# Patient Record
Sex: Male | Born: 1945 | Race: White | Hispanic: No | Smoking: Never smoker
Health system: Southern US, Community
[De-identification: ages and names within clinical notes are randomized; demographics above are authoritative.]

## PROBLEM LIST (undated history)

## (undated) DIAGNOSIS — D7589 Other specified diseases of blood and blood-forming organs: Secondary | ICD-10-CM

## (undated) DIAGNOSIS — K219 Gastro-esophageal reflux disease without esophagitis: Secondary | ICD-10-CM

## (undated) DIAGNOSIS — Z9109 Other allergy status, other than to drugs and biological substances: Secondary | ICD-10-CM

## (undated) DIAGNOSIS — I639 Cerebral infarction, unspecified: Secondary | ICD-10-CM

## (undated) DIAGNOSIS — C4492 Squamous cell carcinoma of skin, unspecified: Secondary | ICD-10-CM

## (undated) DIAGNOSIS — C4491 Basal cell carcinoma of skin, unspecified: Secondary | ICD-10-CM

## (undated) DIAGNOSIS — F419 Anxiety disorder, unspecified: Secondary | ICD-10-CM

## (undated) DIAGNOSIS — F32A Depression, unspecified: Secondary | ICD-10-CM

## (undated) DIAGNOSIS — G2581 Restless legs syndrome: Secondary | ICD-10-CM

## (undated) DIAGNOSIS — E785 Hyperlipidemia, unspecified: Secondary | ICD-10-CM

## (undated) DIAGNOSIS — R609 Edema, unspecified: Secondary | ICD-10-CM

## (undated) DIAGNOSIS — E039 Hypothyroidism, unspecified: Secondary | ICD-10-CM

## (undated) DIAGNOSIS — R1310 Dysphagia, unspecified: Secondary | ICD-10-CM

## (undated) DIAGNOSIS — I10 Essential (primary) hypertension: Secondary | ICD-10-CM

## (undated) DIAGNOSIS — M130 Polyarthritis, unspecified: Secondary | ICD-10-CM

## (undated) DIAGNOSIS — F329 Major depressive disorder, single episode, unspecified: Secondary | ICD-10-CM

## (undated) DIAGNOSIS — I2510 Atherosclerotic heart disease of native coronary artery without angina pectoris: Secondary | ICD-10-CM

## (undated) HISTORY — DX: Dysphagia, unspecified: R13.10

## (undated) HISTORY — DX: Hyperlipidemia, unspecified: E78.5

## (undated) HISTORY — DX: Cerebral infarction, unspecified: I63.9

## (undated) HISTORY — PX: NASAL SEPTUM SURGERY: SHX37

## (undated) HISTORY — DX: Essential (primary) hypertension: I10

## (undated) HISTORY — DX: Polyarthritis, unspecified: M13.0

## (undated) HISTORY — DX: Hypothyroidism, unspecified: E03.9

## (undated) HISTORY — DX: Restless legs syndrome: G25.81

## (undated) HISTORY — DX: Other allergy status, other than to drugs and biological substances: Z91.09

## (undated) HISTORY — DX: Edema, unspecified: R60.9

## (undated) HISTORY — DX: Other specified diseases of blood and blood-forming organs: D75.89

---
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
## 2013-03-22 DIAGNOSIS — F329 Major depressive disorder, single episode, unspecified: Secondary | ICD-10-CM | POA: Diagnosis not present

## 2013-03-22 DIAGNOSIS — Z125 Encounter for screening for malignant neoplasm of prostate: Secondary | ICD-10-CM | POA: Diagnosis not present

## 2013-03-22 DIAGNOSIS — Z0000 Encounter for general adult medical examination without abnormal findings: Secondary | ICD-10-CM | POA: Diagnosis not present

## 2013-03-22 DIAGNOSIS — Z136 Encounter for screening for cardiovascular disorders: Secondary | ICD-10-CM | POA: Diagnosis not present

## 2013-03-22 DIAGNOSIS — E039 Hypothyroidism, unspecified: Secondary | ICD-10-CM | POA: Diagnosis not present

## 2013-03-24 DIAGNOSIS — I639 Cerebral infarction, unspecified: Secondary | ICD-10-CM

## 2013-03-24 HISTORY — DX: Cerebral infarction, unspecified: I63.9

## 2013-03-30 DIAGNOSIS — R7989 Other specified abnormal findings of blood chemistry: Secondary | ICD-10-CM | POA: Diagnosis not present

## 2013-03-30 DIAGNOSIS — Z0000 Encounter for general adult medical examination without abnormal findings: Secondary | ICD-10-CM | POA: Diagnosis not present

## 2013-03-30 DIAGNOSIS — Z23 Encounter for immunization: Secondary | ICD-10-CM | POA: Diagnosis not present

## 2013-03-30 DIAGNOSIS — Z131 Encounter for screening for diabetes mellitus: Secondary | ICD-10-CM | POA: Diagnosis not present

## 2013-04-20 DIAGNOSIS — L570 Actinic keratosis: Secondary | ICD-10-CM | POA: Diagnosis not present

## 2013-04-20 DIAGNOSIS — D239 Other benign neoplasm of skin, unspecified: Secondary | ICD-10-CM | POA: Diagnosis not present

## 2013-04-20 DIAGNOSIS — L219 Seborrheic dermatitis, unspecified: Secondary | ICD-10-CM | POA: Diagnosis not present

## 2013-05-11 DIAGNOSIS — L570 Actinic keratosis: Secondary | ICD-10-CM | POA: Diagnosis not present

## 2013-05-12 DIAGNOSIS — M5030 Other cervical disc degeneration, unspecified cervical region: Secondary | ICD-10-CM | POA: Diagnosis not present

## 2013-05-12 DIAGNOSIS — M9981 Other biomechanical lesions of cervical region: Secondary | ICD-10-CM | POA: Diagnosis not present

## 2013-05-12 DIAGNOSIS — M5000 Cervical disc disorder with myelopathy, unspecified cervical region: Secondary | ICD-10-CM | POA: Diagnosis not present

## 2013-05-12 DIAGNOSIS — M542 Cervicalgia: Secondary | ICD-10-CM | POA: Diagnosis not present

## 2013-05-13 DIAGNOSIS — M5030 Other cervical disc degeneration, unspecified cervical region: Secondary | ICD-10-CM | POA: Diagnosis not present

## 2013-05-13 DIAGNOSIS — M5000 Cervical disc disorder with myelopathy, unspecified cervical region: Secondary | ICD-10-CM | POA: Diagnosis not present

## 2013-05-13 DIAGNOSIS — M542 Cervicalgia: Secondary | ICD-10-CM | POA: Diagnosis not present

## 2013-05-13 DIAGNOSIS — M9981 Other biomechanical lesions of cervical region: Secondary | ICD-10-CM | POA: Diagnosis not present

## 2013-05-14 DIAGNOSIS — M5030 Other cervical disc degeneration, unspecified cervical region: Secondary | ICD-10-CM | POA: Diagnosis not present

## 2013-05-14 DIAGNOSIS — M542 Cervicalgia: Secondary | ICD-10-CM | POA: Diagnosis not present

## 2013-05-14 DIAGNOSIS — M9981 Other biomechanical lesions of cervical region: Secondary | ICD-10-CM | POA: Diagnosis not present

## 2013-05-14 DIAGNOSIS — M5000 Cervical disc disorder with myelopathy, unspecified cervical region: Secondary | ICD-10-CM | POA: Diagnosis not present

## 2013-05-16 DIAGNOSIS — M5000 Cervical disc disorder with myelopathy, unspecified cervical region: Secondary | ICD-10-CM | POA: Diagnosis not present

## 2013-05-16 DIAGNOSIS — M9981 Other biomechanical lesions of cervical region: Secondary | ICD-10-CM | POA: Diagnosis not present

## 2013-05-16 DIAGNOSIS — M542 Cervicalgia: Secondary | ICD-10-CM | POA: Diagnosis not present

## 2013-05-16 DIAGNOSIS — M5030 Other cervical disc degeneration, unspecified cervical region: Secondary | ICD-10-CM | POA: Diagnosis not present

## 2013-05-17 DIAGNOSIS — M5030 Other cervical disc degeneration, unspecified cervical region: Secondary | ICD-10-CM | POA: Diagnosis not present

## 2013-05-17 DIAGNOSIS — M9981 Other biomechanical lesions of cervical region: Secondary | ICD-10-CM | POA: Diagnosis not present

## 2013-05-17 DIAGNOSIS — M542 Cervicalgia: Secondary | ICD-10-CM | POA: Diagnosis not present

## 2013-05-17 DIAGNOSIS — M5000 Cervical disc disorder with myelopathy, unspecified cervical region: Secondary | ICD-10-CM | POA: Diagnosis not present

## 2013-05-18 DIAGNOSIS — M9981 Other biomechanical lesions of cervical region: Secondary | ICD-10-CM | POA: Diagnosis not present

## 2013-05-18 DIAGNOSIS — M542 Cervicalgia: Secondary | ICD-10-CM | POA: Diagnosis not present

## 2013-05-18 DIAGNOSIS — M5000 Cervical disc disorder with myelopathy, unspecified cervical region: Secondary | ICD-10-CM | POA: Diagnosis not present

## 2013-05-18 DIAGNOSIS — M5030 Other cervical disc degeneration, unspecified cervical region: Secondary | ICD-10-CM | POA: Diagnosis not present

## 2013-05-23 DIAGNOSIS — M5030 Other cervical disc degeneration, unspecified cervical region: Secondary | ICD-10-CM | POA: Diagnosis not present

## 2013-05-23 DIAGNOSIS — M542 Cervicalgia: Secondary | ICD-10-CM | POA: Diagnosis not present

## 2013-05-23 DIAGNOSIS — M5000 Cervical disc disorder with myelopathy, unspecified cervical region: Secondary | ICD-10-CM | POA: Diagnosis not present

## 2013-05-23 DIAGNOSIS — M9981 Other biomechanical lesions of cervical region: Secondary | ICD-10-CM | POA: Diagnosis not present

## 2013-05-25 DIAGNOSIS — M9981 Other biomechanical lesions of cervical region: Secondary | ICD-10-CM | POA: Diagnosis not present

## 2013-05-25 DIAGNOSIS — M542 Cervicalgia: Secondary | ICD-10-CM | POA: Diagnosis not present

## 2013-05-25 DIAGNOSIS — M5000 Cervical disc disorder with myelopathy, unspecified cervical region: Secondary | ICD-10-CM | POA: Diagnosis not present

## 2013-05-25 DIAGNOSIS — M5030 Other cervical disc degeneration, unspecified cervical region: Secondary | ICD-10-CM | POA: Diagnosis not present

## 2013-05-27 DIAGNOSIS — M5030 Other cervical disc degeneration, unspecified cervical region: Secondary | ICD-10-CM | POA: Diagnosis not present

## 2013-05-27 DIAGNOSIS — M542 Cervicalgia: Secondary | ICD-10-CM | POA: Diagnosis not present

## 2013-05-27 DIAGNOSIS — M9981 Other biomechanical lesions of cervical region: Secondary | ICD-10-CM | POA: Diagnosis not present

## 2013-05-27 DIAGNOSIS — M5000 Cervical disc disorder with myelopathy, unspecified cervical region: Secondary | ICD-10-CM | POA: Diagnosis not present

## 2013-05-30 DIAGNOSIS — M542 Cervicalgia: Secondary | ICD-10-CM | POA: Diagnosis not present

## 2013-05-30 DIAGNOSIS — M5030 Other cervical disc degeneration, unspecified cervical region: Secondary | ICD-10-CM | POA: Diagnosis not present

## 2013-05-30 DIAGNOSIS — M9981 Other biomechanical lesions of cervical region: Secondary | ICD-10-CM | POA: Diagnosis not present

## 2013-05-30 DIAGNOSIS — M5000 Cervical disc disorder with myelopathy, unspecified cervical region: Secondary | ICD-10-CM | POA: Diagnosis not present

## 2013-06-01 DIAGNOSIS — M9981 Other biomechanical lesions of cervical region: Secondary | ICD-10-CM | POA: Diagnosis not present

## 2013-06-01 DIAGNOSIS — M5030 Other cervical disc degeneration, unspecified cervical region: Secondary | ICD-10-CM | POA: Diagnosis not present

## 2013-06-01 DIAGNOSIS — M5000 Cervical disc disorder with myelopathy, unspecified cervical region: Secondary | ICD-10-CM | POA: Diagnosis not present

## 2013-06-01 DIAGNOSIS — M542 Cervicalgia: Secondary | ICD-10-CM | POA: Diagnosis not present

## 2013-06-03 DIAGNOSIS — M5000 Cervical disc disorder with myelopathy, unspecified cervical region: Secondary | ICD-10-CM | POA: Diagnosis not present

## 2013-06-03 DIAGNOSIS — M5030 Other cervical disc degeneration, unspecified cervical region: Secondary | ICD-10-CM | POA: Diagnosis not present

## 2013-06-03 DIAGNOSIS — M9981 Other biomechanical lesions of cervical region: Secondary | ICD-10-CM | POA: Diagnosis not present

## 2013-06-03 DIAGNOSIS — M542 Cervicalgia: Secondary | ICD-10-CM | POA: Diagnosis not present

## 2013-06-06 DIAGNOSIS — M542 Cervicalgia: Secondary | ICD-10-CM | POA: Diagnosis not present

## 2013-06-06 DIAGNOSIS — M5000 Cervical disc disorder with myelopathy, unspecified cervical region: Secondary | ICD-10-CM | POA: Diagnosis not present

## 2013-06-06 DIAGNOSIS — M9981 Other biomechanical lesions of cervical region: Secondary | ICD-10-CM | POA: Diagnosis not present

## 2013-06-06 DIAGNOSIS — M5030 Other cervical disc degeneration, unspecified cervical region: Secondary | ICD-10-CM | POA: Diagnosis not present

## 2013-06-08 DIAGNOSIS — M9981 Other biomechanical lesions of cervical region: Secondary | ICD-10-CM | POA: Diagnosis not present

## 2013-06-08 DIAGNOSIS — M5000 Cervical disc disorder with myelopathy, unspecified cervical region: Secondary | ICD-10-CM | POA: Diagnosis not present

## 2013-06-08 DIAGNOSIS — M542 Cervicalgia: Secondary | ICD-10-CM | POA: Diagnosis not present

## 2013-06-08 DIAGNOSIS — M5030 Other cervical disc degeneration, unspecified cervical region: Secondary | ICD-10-CM | POA: Diagnosis not present

## 2013-06-13 DIAGNOSIS — M9981 Other biomechanical lesions of cervical region: Secondary | ICD-10-CM | POA: Diagnosis not present

## 2013-06-13 DIAGNOSIS — M5030 Other cervical disc degeneration, unspecified cervical region: Secondary | ICD-10-CM | POA: Diagnosis not present

## 2013-06-13 DIAGNOSIS — M542 Cervicalgia: Secondary | ICD-10-CM | POA: Diagnosis not present

## 2013-06-13 DIAGNOSIS — M5000 Cervical disc disorder with myelopathy, unspecified cervical region: Secondary | ICD-10-CM | POA: Diagnosis not present

## 2013-06-15 DIAGNOSIS — M9981 Other biomechanical lesions of cervical region: Secondary | ICD-10-CM | POA: Diagnosis not present

## 2013-06-15 DIAGNOSIS — M5030 Other cervical disc degeneration, unspecified cervical region: Secondary | ICD-10-CM | POA: Diagnosis not present

## 2013-06-15 DIAGNOSIS — M542 Cervicalgia: Secondary | ICD-10-CM | POA: Diagnosis not present

## 2013-06-15 DIAGNOSIS — M5000 Cervical disc disorder with myelopathy, unspecified cervical region: Secondary | ICD-10-CM | POA: Diagnosis not present

## 2013-06-20 DIAGNOSIS — M5000 Cervical disc disorder with myelopathy, unspecified cervical region: Secondary | ICD-10-CM | POA: Diagnosis not present

## 2013-06-20 DIAGNOSIS — M5030 Other cervical disc degeneration, unspecified cervical region: Secondary | ICD-10-CM | POA: Diagnosis not present

## 2013-06-20 DIAGNOSIS — M9981 Other biomechanical lesions of cervical region: Secondary | ICD-10-CM | POA: Diagnosis not present

## 2013-06-20 DIAGNOSIS — M542 Cervicalgia: Secondary | ICD-10-CM | POA: Diagnosis not present

## 2013-06-22 DIAGNOSIS — M9981 Other biomechanical lesions of cervical region: Secondary | ICD-10-CM | POA: Diagnosis not present

## 2013-06-22 DIAGNOSIS — M5000 Cervical disc disorder with myelopathy, unspecified cervical region: Secondary | ICD-10-CM | POA: Diagnosis not present

## 2013-06-22 DIAGNOSIS — M542 Cervicalgia: Secondary | ICD-10-CM | POA: Diagnosis not present

## 2013-06-22 DIAGNOSIS — M5030 Other cervical disc degeneration, unspecified cervical region: Secondary | ICD-10-CM | POA: Diagnosis not present

## 2013-06-28 DIAGNOSIS — M5030 Other cervical disc degeneration, unspecified cervical region: Secondary | ICD-10-CM | POA: Diagnosis not present

## 2013-06-28 DIAGNOSIS — M5000 Cervical disc disorder with myelopathy, unspecified cervical region: Secondary | ICD-10-CM | POA: Diagnosis not present

## 2013-06-28 DIAGNOSIS — M542 Cervicalgia: Secondary | ICD-10-CM | POA: Diagnosis not present

## 2013-06-28 DIAGNOSIS — M9981 Other biomechanical lesions of cervical region: Secondary | ICD-10-CM | POA: Diagnosis not present

## 2013-06-29 DIAGNOSIS — M5030 Other cervical disc degeneration, unspecified cervical region: Secondary | ICD-10-CM | POA: Diagnosis not present

## 2013-06-29 DIAGNOSIS — M5000 Cervical disc disorder with myelopathy, unspecified cervical region: Secondary | ICD-10-CM | POA: Diagnosis not present

## 2013-06-29 DIAGNOSIS — M542 Cervicalgia: Secondary | ICD-10-CM | POA: Diagnosis not present

## 2013-06-29 DIAGNOSIS — M9981 Other biomechanical lesions of cervical region: Secondary | ICD-10-CM | POA: Diagnosis not present

## 2013-07-06 DIAGNOSIS — M9981 Other biomechanical lesions of cervical region: Secondary | ICD-10-CM | POA: Diagnosis not present

## 2013-07-06 DIAGNOSIS — M542 Cervicalgia: Secondary | ICD-10-CM | POA: Diagnosis not present

## 2013-07-06 DIAGNOSIS — M5000 Cervical disc disorder with myelopathy, unspecified cervical region: Secondary | ICD-10-CM | POA: Diagnosis not present

## 2013-07-06 DIAGNOSIS — M5030 Other cervical disc degeneration, unspecified cervical region: Secondary | ICD-10-CM | POA: Diagnosis not present

## 2013-07-12 DIAGNOSIS — H1045 Other chronic allergic conjunctivitis: Secondary | ICD-10-CM | POA: Diagnosis not present

## 2013-07-12 DIAGNOSIS — J019 Acute sinusitis, unspecified: Secondary | ICD-10-CM | POA: Diagnosis not present

## 2013-07-12 DIAGNOSIS — J309 Allergic rhinitis, unspecified: Secondary | ICD-10-CM | POA: Diagnosis not present

## 2013-07-13 DIAGNOSIS — M9981 Other biomechanical lesions of cervical region: Secondary | ICD-10-CM | POA: Diagnosis not present

## 2013-07-13 DIAGNOSIS — M5030 Other cervical disc degeneration, unspecified cervical region: Secondary | ICD-10-CM | POA: Diagnosis not present

## 2013-07-13 DIAGNOSIS — M5000 Cervical disc disorder with myelopathy, unspecified cervical region: Secondary | ICD-10-CM | POA: Diagnosis not present

## 2013-07-13 DIAGNOSIS — M542 Cervicalgia: Secondary | ICD-10-CM | POA: Diagnosis not present

## 2013-07-15 DIAGNOSIS — J309 Allergic rhinitis, unspecified: Secondary | ICD-10-CM | POA: Diagnosis not present

## 2013-07-20 DIAGNOSIS — M542 Cervicalgia: Secondary | ICD-10-CM | POA: Diagnosis not present

## 2013-07-20 DIAGNOSIS — M5000 Cervical disc disorder with myelopathy, unspecified cervical region: Secondary | ICD-10-CM | POA: Diagnosis not present

## 2013-07-20 DIAGNOSIS — M5030 Other cervical disc degeneration, unspecified cervical region: Secondary | ICD-10-CM | POA: Diagnosis not present

## 2013-07-20 DIAGNOSIS — M9981 Other biomechanical lesions of cervical region: Secondary | ICD-10-CM | POA: Diagnosis not present

## 2013-07-26 DIAGNOSIS — J309 Allergic rhinitis, unspecified: Secondary | ICD-10-CM | POA: Diagnosis not present

## 2013-07-27 DIAGNOSIS — M542 Cervicalgia: Secondary | ICD-10-CM | POA: Diagnosis not present

## 2013-07-27 DIAGNOSIS — M9981 Other biomechanical lesions of cervical region: Secondary | ICD-10-CM | POA: Diagnosis not present

## 2013-07-27 DIAGNOSIS — M5030 Other cervical disc degeneration, unspecified cervical region: Secondary | ICD-10-CM | POA: Diagnosis not present

## 2013-07-27 DIAGNOSIS — M5000 Cervical disc disorder with myelopathy, unspecified cervical region: Secondary | ICD-10-CM | POA: Diagnosis not present

## 2013-07-28 DIAGNOSIS — J309 Allergic rhinitis, unspecified: Secondary | ICD-10-CM | POA: Diagnosis not present

## 2013-08-01 DIAGNOSIS — J309 Allergic rhinitis, unspecified: Secondary | ICD-10-CM | POA: Diagnosis not present

## 2013-08-02 DIAGNOSIS — M542 Cervicalgia: Secondary | ICD-10-CM | POA: Diagnosis not present

## 2013-08-02 DIAGNOSIS — M5030 Other cervical disc degeneration, unspecified cervical region: Secondary | ICD-10-CM | POA: Diagnosis not present

## 2013-08-02 DIAGNOSIS — M5000 Cervical disc disorder with myelopathy, unspecified cervical region: Secondary | ICD-10-CM | POA: Diagnosis not present

## 2013-08-02 DIAGNOSIS — M9981 Other biomechanical lesions of cervical region: Secondary | ICD-10-CM | POA: Diagnosis not present

## 2013-08-03 DIAGNOSIS — R7989 Other specified abnormal findings of blood chemistry: Secondary | ICD-10-CM | POA: Diagnosis not present

## 2013-08-03 DIAGNOSIS — J309 Allergic rhinitis, unspecified: Secondary | ICD-10-CM | POA: Diagnosis not present

## 2013-08-05 DIAGNOSIS — J309 Allergic rhinitis, unspecified: Secondary | ICD-10-CM | POA: Diagnosis not present

## 2013-08-08 DIAGNOSIS — J309 Allergic rhinitis, unspecified: Secondary | ICD-10-CM | POA: Diagnosis not present

## 2013-08-10 DIAGNOSIS — J309 Allergic rhinitis, unspecified: Secondary | ICD-10-CM | POA: Diagnosis not present

## 2013-08-12 DIAGNOSIS — J309 Allergic rhinitis, unspecified: Secondary | ICD-10-CM | POA: Diagnosis not present

## 2013-08-22 DIAGNOSIS — J309 Allergic rhinitis, unspecified: Secondary | ICD-10-CM | POA: Diagnosis not present

## 2013-08-24 DIAGNOSIS — J309 Allergic rhinitis, unspecified: Secondary | ICD-10-CM | POA: Diagnosis not present

## 2013-08-26 DIAGNOSIS — J309 Allergic rhinitis, unspecified: Secondary | ICD-10-CM | POA: Diagnosis not present

## 2013-08-30 DIAGNOSIS — M5000 Cervical disc disorder with myelopathy, unspecified cervical region: Secondary | ICD-10-CM | POA: Diagnosis not present

## 2013-08-30 DIAGNOSIS — J309 Allergic rhinitis, unspecified: Secondary | ICD-10-CM | POA: Diagnosis not present

## 2013-08-30 DIAGNOSIS — M5030 Other cervical disc degeneration, unspecified cervical region: Secondary | ICD-10-CM | POA: Diagnosis not present

## 2013-08-30 DIAGNOSIS — M9981 Other biomechanical lesions of cervical region: Secondary | ICD-10-CM | POA: Diagnosis not present

## 2013-08-30 DIAGNOSIS — M542 Cervicalgia: Secondary | ICD-10-CM | POA: Diagnosis not present

## 2013-09-02 DIAGNOSIS — J309 Allergic rhinitis, unspecified: Secondary | ICD-10-CM | POA: Diagnosis not present

## 2013-09-05 DIAGNOSIS — J309 Allergic rhinitis, unspecified: Secondary | ICD-10-CM | POA: Diagnosis not present

## 2013-09-08 DIAGNOSIS — J309 Allergic rhinitis, unspecified: Secondary | ICD-10-CM | POA: Diagnosis not present

## 2013-09-12 DIAGNOSIS — J309 Allergic rhinitis, unspecified: Secondary | ICD-10-CM | POA: Diagnosis not present

## 2013-09-15 DIAGNOSIS — J309 Allergic rhinitis, unspecified: Secondary | ICD-10-CM | POA: Diagnosis not present

## 2013-09-19 DIAGNOSIS — J309 Allergic rhinitis, unspecified: Secondary | ICD-10-CM | POA: Diagnosis not present

## 2013-09-21 DIAGNOSIS — J309 Allergic rhinitis, unspecified: Secondary | ICD-10-CM | POA: Diagnosis not present

## 2013-09-28 DIAGNOSIS — J309 Allergic rhinitis, unspecified: Secondary | ICD-10-CM | POA: Diagnosis not present

## 2013-09-30 DIAGNOSIS — J309 Allergic rhinitis, unspecified: Secondary | ICD-10-CM | POA: Diagnosis not present

## 2013-10-03 DIAGNOSIS — J309 Allergic rhinitis, unspecified: Secondary | ICD-10-CM | POA: Diagnosis not present

## 2013-10-06 DIAGNOSIS — J309 Allergic rhinitis, unspecified: Secondary | ICD-10-CM | POA: Diagnosis not present

## 2013-10-07 DIAGNOSIS — H1045 Other chronic allergic conjunctivitis: Secondary | ICD-10-CM | POA: Diagnosis not present

## 2013-10-07 DIAGNOSIS — J3089 Other allergic rhinitis: Secondary | ICD-10-CM | POA: Diagnosis not present

## 2013-10-07 DIAGNOSIS — J301 Allergic rhinitis due to pollen: Secondary | ICD-10-CM | POA: Diagnosis not present

## 2013-10-07 DIAGNOSIS — J019 Acute sinusitis, unspecified: Secondary | ICD-10-CM | POA: Diagnosis not present

## 2013-10-10 DIAGNOSIS — J309 Allergic rhinitis, unspecified: Secondary | ICD-10-CM | POA: Diagnosis not present

## 2013-10-14 DIAGNOSIS — J309 Allergic rhinitis, unspecified: Secondary | ICD-10-CM | POA: Diagnosis not present

## 2013-10-18 DIAGNOSIS — J309 Allergic rhinitis, unspecified: Secondary | ICD-10-CM | POA: Diagnosis not present

## 2013-10-21 DIAGNOSIS — J309 Allergic rhinitis, unspecified: Secondary | ICD-10-CM | POA: Diagnosis not present

## 2013-10-24 DIAGNOSIS — J309 Allergic rhinitis, unspecified: Secondary | ICD-10-CM | POA: Diagnosis not present

## 2013-11-01 DIAGNOSIS — J309 Allergic rhinitis, unspecified: Secondary | ICD-10-CM | POA: Diagnosis not present

## 2013-11-14 DIAGNOSIS — J309 Allergic rhinitis, unspecified: Secondary | ICD-10-CM | POA: Diagnosis not present

## 2013-11-21 DIAGNOSIS — J309 Allergic rhinitis, unspecified: Secondary | ICD-10-CM | POA: Diagnosis not present

## 2013-11-29 DIAGNOSIS — J309 Allergic rhinitis, unspecified: Secondary | ICD-10-CM | POA: Diagnosis not present

## 2013-12-02 DIAGNOSIS — J309 Allergic rhinitis, unspecified: Secondary | ICD-10-CM | POA: Diagnosis not present

## 2013-12-05 DIAGNOSIS — J309 Allergic rhinitis, unspecified: Secondary | ICD-10-CM | POA: Diagnosis not present

## 2013-12-08 DIAGNOSIS — J309 Allergic rhinitis, unspecified: Secondary | ICD-10-CM | POA: Diagnosis not present

## 2013-12-09 DIAGNOSIS — Z23 Encounter for immunization: Secondary | ICD-10-CM | POA: Diagnosis not present

## 2013-12-12 DIAGNOSIS — J3089 Other allergic rhinitis: Secondary | ICD-10-CM | POA: Diagnosis not present

## 2013-12-12 DIAGNOSIS — J309 Allergic rhinitis, unspecified: Secondary | ICD-10-CM | POA: Diagnosis not present

## 2013-12-12 DIAGNOSIS — J301 Allergic rhinitis due to pollen: Secondary | ICD-10-CM | POA: Diagnosis not present

## 2013-12-12 DIAGNOSIS — H1045 Other chronic allergic conjunctivitis: Secondary | ICD-10-CM | POA: Diagnosis not present

## 2013-12-15 DIAGNOSIS — L570 Actinic keratosis: Secondary | ICD-10-CM | POA: Diagnosis not present

## 2013-12-15 DIAGNOSIS — D485 Neoplasm of uncertain behavior of skin: Secondary | ICD-10-CM | POA: Diagnosis not present

## 2013-12-19 DIAGNOSIS — J309 Allergic rhinitis, unspecified: Secondary | ICD-10-CM | POA: Diagnosis not present

## 2013-12-23 DIAGNOSIS — L570 Actinic keratosis: Secondary | ICD-10-CM | POA: Diagnosis not present

## 2013-12-26 DIAGNOSIS — J301 Allergic rhinitis due to pollen: Secondary | ICD-10-CM | POA: Diagnosis not present

## 2013-12-26 DIAGNOSIS — J3081 Allergic rhinitis due to animal (cat) (dog) hair and dander: Secondary | ICD-10-CM | POA: Diagnosis not present

## 2013-12-26 DIAGNOSIS — J3089 Other allergic rhinitis: Secondary | ICD-10-CM | POA: Diagnosis not present

## 2013-12-27 DIAGNOSIS — H2513 Age-related nuclear cataract, bilateral: Secondary | ICD-10-CM | POA: Diagnosis not present

## 2013-12-27 DIAGNOSIS — H5703 Miosis: Secondary | ICD-10-CM | POA: Diagnosis not present

## 2014-01-02 DIAGNOSIS — J3081 Allergic rhinitis due to animal (cat) (dog) hair and dander: Secondary | ICD-10-CM | POA: Diagnosis not present

## 2014-01-02 DIAGNOSIS — J301 Allergic rhinitis due to pollen: Secondary | ICD-10-CM | POA: Diagnosis not present

## 2014-01-02 DIAGNOSIS — J3089 Other allergic rhinitis: Secondary | ICD-10-CM | POA: Diagnosis not present

## 2014-01-09 DIAGNOSIS — J3081 Allergic rhinitis due to animal (cat) (dog) hair and dander: Secondary | ICD-10-CM | POA: Diagnosis not present

## 2014-01-09 DIAGNOSIS — J301 Allergic rhinitis due to pollen: Secondary | ICD-10-CM | POA: Diagnosis not present

## 2014-01-09 DIAGNOSIS — J3089 Other allergic rhinitis: Secondary | ICD-10-CM | POA: Diagnosis not present

## 2014-01-16 DIAGNOSIS — J3081 Allergic rhinitis due to animal (cat) (dog) hair and dander: Secondary | ICD-10-CM | POA: Diagnosis not present

## 2014-01-16 DIAGNOSIS — J301 Allergic rhinitis due to pollen: Secondary | ICD-10-CM | POA: Diagnosis not present

## 2014-01-16 DIAGNOSIS — J3089 Other allergic rhinitis: Secondary | ICD-10-CM | POA: Diagnosis not present

## 2014-01-20 DIAGNOSIS — J019 Acute sinusitis, unspecified: Secondary | ICD-10-CM | POA: Diagnosis not present

## 2014-01-23 DIAGNOSIS — L570 Actinic keratosis: Secondary | ICD-10-CM | POA: Diagnosis not present

## 2014-01-23 DIAGNOSIS — J3081 Allergic rhinitis due to animal (cat) (dog) hair and dander: Secondary | ICD-10-CM | POA: Diagnosis not present

## 2014-01-23 DIAGNOSIS — D485 Neoplasm of uncertain behavior of skin: Secondary | ICD-10-CM | POA: Diagnosis not present

## 2014-01-23 DIAGNOSIS — J301 Allergic rhinitis due to pollen: Secondary | ICD-10-CM | POA: Diagnosis not present

## 2014-01-23 DIAGNOSIS — J3089 Other allergic rhinitis: Secondary | ICD-10-CM | POA: Diagnosis not present

## 2014-01-31 DIAGNOSIS — J3089 Other allergic rhinitis: Secondary | ICD-10-CM | POA: Diagnosis not present

## 2014-01-31 DIAGNOSIS — J301 Allergic rhinitis due to pollen: Secondary | ICD-10-CM | POA: Diagnosis not present

## 2014-02-06 DIAGNOSIS — J3081 Allergic rhinitis due to animal (cat) (dog) hair and dander: Secondary | ICD-10-CM | POA: Diagnosis not present

## 2014-02-06 DIAGNOSIS — J3089 Other allergic rhinitis: Secondary | ICD-10-CM | POA: Diagnosis not present

## 2014-02-06 DIAGNOSIS — J301 Allergic rhinitis due to pollen: Secondary | ICD-10-CM | POA: Diagnosis not present

## 2014-02-13 DIAGNOSIS — J301 Allergic rhinitis due to pollen: Secondary | ICD-10-CM | POA: Diagnosis not present

## 2014-02-13 DIAGNOSIS — J3089 Other allergic rhinitis: Secondary | ICD-10-CM | POA: Diagnosis not present

## 2014-02-13 DIAGNOSIS — J3081 Allergic rhinitis due to animal (cat) (dog) hair and dander: Secondary | ICD-10-CM | POA: Diagnosis not present

## 2014-02-14 DIAGNOSIS — J329 Chronic sinusitis, unspecified: Secondary | ICD-10-CM | POA: Diagnosis not present

## 2014-02-14 DIAGNOSIS — E780 Pure hypercholesterolemia: Secondary | ICD-10-CM | POA: Diagnosis not present

## 2014-02-14 DIAGNOSIS — F4321 Adjustment disorder with depressed mood: Secondary | ICD-10-CM | POA: Diagnosis not present

## 2014-02-14 DIAGNOSIS — Z23 Encounter for immunization: Secondary | ICD-10-CM | POA: Diagnosis not present

## 2014-02-14 DIAGNOSIS — E039 Hypothyroidism, unspecified: Secondary | ICD-10-CM | POA: Diagnosis not present

## 2014-02-20 DIAGNOSIS — J3089 Other allergic rhinitis: Secondary | ICD-10-CM | POA: Diagnosis not present

## 2014-02-20 DIAGNOSIS — J301 Allergic rhinitis due to pollen: Secondary | ICD-10-CM | POA: Diagnosis not present

## 2014-02-20 DIAGNOSIS — J3081 Allergic rhinitis due to animal (cat) (dog) hair and dander: Secondary | ICD-10-CM | POA: Diagnosis not present

## 2014-02-27 DIAGNOSIS — J301 Allergic rhinitis due to pollen: Secondary | ICD-10-CM | POA: Diagnosis not present

## 2014-02-27 DIAGNOSIS — J3089 Other allergic rhinitis: Secondary | ICD-10-CM | POA: Diagnosis not present

## 2014-02-27 DIAGNOSIS — J3081 Allergic rhinitis due to animal (cat) (dog) hair and dander: Secondary | ICD-10-CM | POA: Diagnosis not present

## 2014-03-07 DIAGNOSIS — J3089 Other allergic rhinitis: Secondary | ICD-10-CM | POA: Diagnosis not present

## 2014-03-07 DIAGNOSIS — J3081 Allergic rhinitis due to animal (cat) (dog) hair and dander: Secondary | ICD-10-CM | POA: Diagnosis not present

## 2014-03-07 DIAGNOSIS — J301 Allergic rhinitis due to pollen: Secondary | ICD-10-CM | POA: Diagnosis not present

## 2014-03-14 DIAGNOSIS — J301 Allergic rhinitis due to pollen: Secondary | ICD-10-CM | POA: Diagnosis not present

## 2014-03-14 DIAGNOSIS — J3081 Allergic rhinitis due to animal (cat) (dog) hair and dander: Secondary | ICD-10-CM | POA: Diagnosis not present

## 2014-03-14 DIAGNOSIS — J3089 Other allergic rhinitis: Secondary | ICD-10-CM | POA: Diagnosis not present

## 2014-03-22 DIAGNOSIS — J3081 Allergic rhinitis due to animal (cat) (dog) hair and dander: Secondary | ICD-10-CM | POA: Diagnosis not present

## 2014-03-22 DIAGNOSIS — J301 Allergic rhinitis due to pollen: Secondary | ICD-10-CM | POA: Diagnosis not present

## 2014-03-22 DIAGNOSIS — J3089 Other allergic rhinitis: Secondary | ICD-10-CM | POA: Diagnosis not present

## 2014-03-27 DIAGNOSIS — M542 Cervicalgia: Secondary | ICD-10-CM | POA: Diagnosis not present

## 2014-03-27 DIAGNOSIS — M9901 Segmental and somatic dysfunction of cervical region: Secondary | ICD-10-CM | POA: Diagnosis not present

## 2014-03-27 DIAGNOSIS — M9902 Segmental and somatic dysfunction of thoracic region: Secondary | ICD-10-CM | POA: Diagnosis not present

## 2014-03-27 DIAGNOSIS — M9903 Segmental and somatic dysfunction of lumbar region: Secondary | ICD-10-CM | POA: Diagnosis not present

## 2014-03-27 DIAGNOSIS — J3089 Other allergic rhinitis: Secondary | ICD-10-CM | POA: Diagnosis not present

## 2014-03-27 DIAGNOSIS — J301 Allergic rhinitis due to pollen: Secondary | ICD-10-CM | POA: Diagnosis not present

## 2014-03-27 DIAGNOSIS — J3081 Allergic rhinitis due to animal (cat) (dog) hair and dander: Secondary | ICD-10-CM | POA: Diagnosis not present

## 2014-04-01 DIAGNOSIS — C4491 Basal cell carcinoma of skin, unspecified: Secondary | ICD-10-CM

## 2014-04-01 HISTORY — DX: Basal cell carcinoma of skin, unspecified: C44.91

## 2014-04-03 DIAGNOSIS — J301 Allergic rhinitis due to pollen: Secondary | ICD-10-CM | POA: Diagnosis not present

## 2014-04-03 DIAGNOSIS — J3081 Allergic rhinitis due to animal (cat) (dog) hair and dander: Secondary | ICD-10-CM | POA: Diagnosis not present

## 2014-04-03 DIAGNOSIS — J3089 Other allergic rhinitis: Secondary | ICD-10-CM | POA: Diagnosis not present

## 2014-04-11 DIAGNOSIS — J3081 Allergic rhinitis due to animal (cat) (dog) hair and dander: Secondary | ICD-10-CM | POA: Diagnosis not present

## 2014-04-11 DIAGNOSIS — J301 Allergic rhinitis due to pollen: Secondary | ICD-10-CM | POA: Diagnosis not present

## 2014-04-11 DIAGNOSIS — J3089 Other allergic rhinitis: Secondary | ICD-10-CM | POA: Diagnosis not present

## 2014-04-13 DIAGNOSIS — L570 Actinic keratosis: Secondary | ICD-10-CM | POA: Diagnosis not present

## 2014-04-13 DIAGNOSIS — L723 Sebaceous cyst: Secondary | ICD-10-CM | POA: Diagnosis not present

## 2014-04-17 DIAGNOSIS — J3089 Other allergic rhinitis: Secondary | ICD-10-CM | POA: Diagnosis not present

## 2014-04-17 DIAGNOSIS — J3081 Allergic rhinitis due to animal (cat) (dog) hair and dander: Secondary | ICD-10-CM | POA: Diagnosis not present

## 2014-04-17 DIAGNOSIS — J301 Allergic rhinitis due to pollen: Secondary | ICD-10-CM | POA: Diagnosis not present

## 2014-04-19 DIAGNOSIS — J301 Allergic rhinitis due to pollen: Secondary | ICD-10-CM | POA: Diagnosis not present

## 2014-04-19 DIAGNOSIS — J3081 Allergic rhinitis due to animal (cat) (dog) hair and dander: Secondary | ICD-10-CM | POA: Diagnosis not present

## 2014-04-19 DIAGNOSIS — J3089 Other allergic rhinitis: Secondary | ICD-10-CM | POA: Diagnosis not present

## 2014-04-25 DIAGNOSIS — J3089 Other allergic rhinitis: Secondary | ICD-10-CM | POA: Diagnosis not present

## 2014-04-25 DIAGNOSIS — J301 Allergic rhinitis due to pollen: Secondary | ICD-10-CM | POA: Diagnosis not present

## 2014-04-25 DIAGNOSIS — J3081 Allergic rhinitis due to animal (cat) (dog) hair and dander: Secondary | ICD-10-CM | POA: Diagnosis not present

## 2014-05-01 DIAGNOSIS — J301 Allergic rhinitis due to pollen: Secondary | ICD-10-CM | POA: Diagnosis not present

## 2014-05-01 DIAGNOSIS — M9901 Segmental and somatic dysfunction of cervical region: Secondary | ICD-10-CM | POA: Diagnosis not present

## 2014-05-01 DIAGNOSIS — M9903 Segmental and somatic dysfunction of lumbar region: Secondary | ICD-10-CM | POA: Diagnosis not present

## 2014-05-01 DIAGNOSIS — J3081 Allergic rhinitis due to animal (cat) (dog) hair and dander: Secondary | ICD-10-CM | POA: Diagnosis not present

## 2014-05-01 DIAGNOSIS — J3089 Other allergic rhinitis: Secondary | ICD-10-CM | POA: Diagnosis not present

## 2014-05-01 DIAGNOSIS — M9902 Segmental and somatic dysfunction of thoracic region: Secondary | ICD-10-CM | POA: Diagnosis not present

## 2014-05-01 DIAGNOSIS — M542 Cervicalgia: Secondary | ICD-10-CM | POA: Diagnosis not present

## 2014-05-05 DIAGNOSIS — J3081 Allergic rhinitis due to animal (cat) (dog) hair and dander: Secondary | ICD-10-CM | POA: Diagnosis not present

## 2014-05-05 DIAGNOSIS — J301 Allergic rhinitis due to pollen: Secondary | ICD-10-CM | POA: Diagnosis not present

## 2014-05-05 DIAGNOSIS — J3089 Other allergic rhinitis: Secondary | ICD-10-CM | POA: Diagnosis not present

## 2014-05-09 DIAGNOSIS — J301 Allergic rhinitis due to pollen: Secondary | ICD-10-CM | POA: Diagnosis not present

## 2014-05-09 DIAGNOSIS — J3089 Other allergic rhinitis: Secondary | ICD-10-CM | POA: Diagnosis not present

## 2014-05-09 DIAGNOSIS — J3081 Allergic rhinitis due to animal (cat) (dog) hair and dander: Secondary | ICD-10-CM | POA: Diagnosis not present

## 2014-05-12 DIAGNOSIS — J3089 Other allergic rhinitis: Secondary | ICD-10-CM | POA: Diagnosis not present

## 2014-05-12 DIAGNOSIS — J3081 Allergic rhinitis due to animal (cat) (dog) hair and dander: Secondary | ICD-10-CM | POA: Diagnosis not present

## 2014-05-12 DIAGNOSIS — J301 Allergic rhinitis due to pollen: Secondary | ICD-10-CM | POA: Diagnosis not present

## 2014-05-16 DIAGNOSIS — J301 Allergic rhinitis due to pollen: Secondary | ICD-10-CM | POA: Diagnosis not present

## 2014-05-16 DIAGNOSIS — J3089 Other allergic rhinitis: Secondary | ICD-10-CM | POA: Diagnosis not present

## 2014-05-16 DIAGNOSIS — J3081 Allergic rhinitis due to animal (cat) (dog) hair and dander: Secondary | ICD-10-CM | POA: Diagnosis not present

## 2014-05-22 DIAGNOSIS — J301 Allergic rhinitis due to pollen: Secondary | ICD-10-CM | POA: Diagnosis not present

## 2014-05-22 DIAGNOSIS — J3081 Allergic rhinitis due to animal (cat) (dog) hair and dander: Secondary | ICD-10-CM | POA: Diagnosis not present

## 2014-05-22 DIAGNOSIS — J3089 Other allergic rhinitis: Secondary | ICD-10-CM | POA: Diagnosis not present

## 2014-05-29 DIAGNOSIS — J3089 Other allergic rhinitis: Secondary | ICD-10-CM | POA: Diagnosis not present

## 2014-05-29 DIAGNOSIS — M9903 Segmental and somatic dysfunction of lumbar region: Secondary | ICD-10-CM | POA: Diagnosis not present

## 2014-05-29 DIAGNOSIS — M9902 Segmental and somatic dysfunction of thoracic region: Secondary | ICD-10-CM | POA: Diagnosis not present

## 2014-05-29 DIAGNOSIS — J3081 Allergic rhinitis due to animal (cat) (dog) hair and dander: Secondary | ICD-10-CM | POA: Diagnosis not present

## 2014-05-29 DIAGNOSIS — M542 Cervicalgia: Secondary | ICD-10-CM | POA: Diagnosis not present

## 2014-05-29 DIAGNOSIS — J301 Allergic rhinitis due to pollen: Secondary | ICD-10-CM | POA: Diagnosis not present

## 2014-05-29 DIAGNOSIS — M9901 Segmental and somatic dysfunction of cervical region: Secondary | ICD-10-CM | POA: Diagnosis not present

## 2014-06-05 DIAGNOSIS — J3089 Other allergic rhinitis: Secondary | ICD-10-CM | POA: Diagnosis not present

## 2014-06-05 DIAGNOSIS — J301 Allergic rhinitis due to pollen: Secondary | ICD-10-CM | POA: Diagnosis not present

## 2014-06-05 DIAGNOSIS — J3081 Allergic rhinitis due to animal (cat) (dog) hair and dander: Secondary | ICD-10-CM | POA: Diagnosis not present

## 2014-06-12 DIAGNOSIS — J301 Allergic rhinitis due to pollen: Secondary | ICD-10-CM | POA: Diagnosis not present

## 2014-06-12 DIAGNOSIS — J3089 Other allergic rhinitis: Secondary | ICD-10-CM | POA: Diagnosis not present

## 2014-06-12 DIAGNOSIS — J3081 Allergic rhinitis due to animal (cat) (dog) hair and dander: Secondary | ICD-10-CM | POA: Diagnosis not present

## 2014-06-19 DIAGNOSIS — J301 Allergic rhinitis due to pollen: Secondary | ICD-10-CM | POA: Diagnosis not present

## 2014-06-19 DIAGNOSIS — J3089 Other allergic rhinitis: Secondary | ICD-10-CM | POA: Diagnosis not present

## 2014-06-26 DIAGNOSIS — J3081 Allergic rhinitis due to animal (cat) (dog) hair and dander: Secondary | ICD-10-CM | POA: Diagnosis not present

## 2014-06-26 DIAGNOSIS — J301 Allergic rhinitis due to pollen: Secondary | ICD-10-CM | POA: Diagnosis not present

## 2014-06-26 DIAGNOSIS — M9902 Segmental and somatic dysfunction of thoracic region: Secondary | ICD-10-CM | POA: Diagnosis not present

## 2014-06-26 DIAGNOSIS — J3089 Other allergic rhinitis: Secondary | ICD-10-CM | POA: Diagnosis not present

## 2014-06-26 DIAGNOSIS — M9901 Segmental and somatic dysfunction of cervical region: Secondary | ICD-10-CM | POA: Diagnosis not present

## 2014-06-26 DIAGNOSIS — M9903 Segmental and somatic dysfunction of lumbar region: Secondary | ICD-10-CM | POA: Diagnosis not present

## 2014-06-26 DIAGNOSIS — M542 Cervicalgia: Secondary | ICD-10-CM | POA: Diagnosis not present

## 2014-06-27 DIAGNOSIS — H1045 Other chronic allergic conjunctivitis: Secondary | ICD-10-CM | POA: Diagnosis not present

## 2014-06-27 DIAGNOSIS — J3089 Other allergic rhinitis: Secondary | ICD-10-CM | POA: Diagnosis not present

## 2014-06-27 DIAGNOSIS — J019 Acute sinusitis, unspecified: Secondary | ICD-10-CM | POA: Diagnosis not present

## 2014-06-27 DIAGNOSIS — J209 Acute bronchitis, unspecified: Secondary | ICD-10-CM | POA: Diagnosis not present

## 2014-06-27 DIAGNOSIS — R05 Cough: Secondary | ICD-10-CM | POA: Diagnosis not present

## 2014-06-27 DIAGNOSIS — J3081 Allergic rhinitis due to animal (cat) (dog) hair and dander: Secondary | ICD-10-CM | POA: Diagnosis not present

## 2014-06-27 DIAGNOSIS — J301 Allergic rhinitis due to pollen: Secondary | ICD-10-CM | POA: Diagnosis not present

## 2014-07-01 ENCOUNTER — Encounter (HOSPITAL_COMMUNITY): Payer: Self-pay | Admitting: Emergency Medicine

## 2014-07-01 ENCOUNTER — Inpatient Hospital Stay (HOSPITAL_COMMUNITY)
Admission: EM | Admit: 2014-07-01 | Discharge: 2014-07-04 | DRG: 065 | Disposition: A | Discharge status: Home Health | Payer: Medicare Other | Attending: Internal Medicine | Admitting: Internal Medicine

## 2014-07-01 ENCOUNTER — Emergency Department (HOSPITAL_COMMUNITY): Payer: Medicare Other

## 2014-07-01 DIAGNOSIS — R1310 Dysphagia, unspecified: Secondary | ICD-10-CM

## 2014-07-01 DIAGNOSIS — R471 Dysarthria and anarthria: Secondary | ICD-10-CM | POA: Diagnosis present

## 2014-07-01 DIAGNOSIS — Z66 Do not resuscitate: Secondary | ICD-10-CM | POA: Diagnosis not present

## 2014-07-01 DIAGNOSIS — I6502 Occlusion and stenosis of left vertebral artery: Secondary | ICD-10-CM

## 2014-07-01 DIAGNOSIS — I63212 Cerebral infarction due to unspecified occlusion or stenosis of left vertebral arteries: Principal | ICD-10-CM | POA: Diagnosis present

## 2014-07-01 DIAGNOSIS — I639 Cerebral infarction, unspecified: Secondary | ICD-10-CM | POA: Diagnosis present

## 2014-07-01 DIAGNOSIS — R4702 Dysphasia: Secondary | ICD-10-CM | POA: Diagnosis not present

## 2014-07-01 DIAGNOSIS — R200 Anesthesia of skin: Secondary | ICD-10-CM

## 2014-07-01 DIAGNOSIS — F329 Major depressive disorder, single episode, unspecified: Secondary | ICD-10-CM | POA: Diagnosis not present

## 2014-07-01 DIAGNOSIS — E039 Hypothyroidism, unspecified: Secondary | ICD-10-CM | POA: Diagnosis not present

## 2014-07-01 DIAGNOSIS — K219 Gastro-esophageal reflux disease without esophagitis: Secondary | ICD-10-CM | POA: Diagnosis present

## 2014-07-01 DIAGNOSIS — G8191 Hemiplegia, unspecified affecting right dominant side: Secondary | ICD-10-CM

## 2014-07-01 DIAGNOSIS — E785 Hyperlipidemia, unspecified: Secondary | ICD-10-CM | POA: Diagnosis present

## 2014-07-01 DIAGNOSIS — I6509 Occlusion and stenosis of unspecified vertebral artery: Secondary | ICD-10-CM | POA: Diagnosis present

## 2014-07-01 HISTORY — DX: Cerebral infarction, unspecified: I63.9

## 2014-07-01 HISTORY — DX: Depression, unspecified: F32.A

## 2014-07-01 HISTORY — DX: Major depressive disorder, single episode, unspecified: F32.9

## 2014-07-01 LAB — COMPREHENSIVE METABOLIC PANEL
ALBUMIN: 3.8 g/dL (ref 3.5–5.2)
ALT: 31 U/L (ref 0–53)
ANION GAP: 10 (ref 5–15)
AST: 29 U/L (ref 0–37)
Alkaline Phosphatase: 66 U/L (ref 39–117)
BUN: 23 mg/dL (ref 6–23)
CO2: 27 mmol/L (ref 19–32)
Calcium: 8.8 mg/dL (ref 8.4–10.5)
Chloride: 102 mmol/L (ref 96–112)
Creatinine, Ser: 0.89 mg/dL (ref 0.50–1.35)
GFR calc non Af Amer: 87 mL/min — ABNORMAL LOW (ref >90)
Glucose, Bld: 141 mg/dL — ABNORMAL HIGH (ref 70–99)
Potassium: 4.1 mmol/L (ref 3.5–5.1)
SODIUM: 139 mmol/L (ref 135–145)
Total Bilirubin: 0.9 mg/dL (ref 0.3–1.2)
Total Protein: 7 g/dL (ref 6.0–8.3)

## 2014-07-01 LAB — I-STAT CHEM 8, ED
BUN: 31 mg/dL — AB (ref 6–23)
CALCIUM ION: 1.11 mmol/L — AB (ref 1.13–1.30)
Chloride: 100 mmol/L (ref 96–112)
Creatinine, Ser: 0.8 mg/dL (ref 0.50–1.35)
GLUCOSE: 146 mg/dL — AB (ref 70–99)
HCT: 47 % (ref 39.0–52.0)
Hemoglobin: 16 g/dL (ref 13.0–17.0)
Potassium: 4 mmol/L (ref 3.5–5.1)
SODIUM: 140 mmol/L (ref 135–145)
TCO2: 28 mmol/L (ref 0–100)

## 2014-07-01 LAB — CBC
HCT: 44.2 % (ref 39.0–52.0)
HEMOGLOBIN: 15.1 g/dL (ref 13.0–17.0)
MCH: 32.3 pg (ref 26.0–34.0)
MCHC: 34.2 g/dL (ref 30.0–36.0)
MCV: 94.6 fL (ref 78.0–100.0)
Platelets: 273 10*3/uL (ref 150–400)
RBC: 4.67 MIL/uL (ref 4.22–5.81)
RDW: 12.7 % (ref 11.5–15.5)
WBC: 11.5 10*3/uL — ABNORMAL HIGH (ref 4.0–10.5)

## 2014-07-01 LAB — PROTIME-INR
INR: 0.97 (ref 0.00–1.49)
Prothrombin Time: 13 seconds (ref 11.6–15.2)

## 2014-07-01 LAB — DIFFERENTIAL
BASOS ABS: 0 10*3/uL (ref 0.0–0.1)
Basophils Relative: 0 % (ref 0–1)
Eosinophils Absolute: 0 10*3/uL (ref 0.0–0.7)
Eosinophils Relative: 0 % (ref 0–5)
Lymphocytes Relative: 18 % (ref 12–46)
Lymphs Abs: 2 10*3/uL (ref 0.7–4.0)
MONO ABS: 0.6 10*3/uL (ref 0.1–1.0)
MONOS PCT: 5 % (ref 3–12)
NEUTROS PCT: 77 % (ref 43–77)
Neutro Abs: 8.9 10*3/uL — ABNORMAL HIGH (ref 1.7–7.7)

## 2014-07-01 LAB — URINALYSIS, ROUTINE W REFLEX MICROSCOPIC
BILIRUBIN URINE: NEGATIVE
Glucose, UA: NEGATIVE mg/dL
Hgb urine dipstick: NEGATIVE
Ketones, ur: NEGATIVE mg/dL
LEUKOCYTES UA: NEGATIVE
Nitrite: NEGATIVE
Protein, ur: NEGATIVE mg/dL
SPECIFIC GRAVITY, URINE: 1.027 (ref 1.005–1.030)
Urobilinogen, UA: 0.2 mg/dL (ref 0.0–1.0)
pH: 7 (ref 5.0–8.0)

## 2014-07-01 LAB — RAPID URINE DRUG SCREEN, HOSP PERFORMED
AMPHETAMINES: NOT DETECTED
Barbiturates: NOT DETECTED
Benzodiazepines: NOT DETECTED
COCAINE: NOT DETECTED
Opiates: NOT DETECTED
Tetrahydrocannabinol: NOT DETECTED

## 2014-07-01 LAB — I-STAT TROPONIN, ED: Troponin i, poc: 0 ng/mL (ref 0.00–0.08)

## 2014-07-01 LAB — CBG MONITORING, ED: Glucose-Capillary: 139 mg/dL — ABNORMAL HIGH (ref 70–99)

## 2014-07-01 LAB — APTT: aPTT: 26 seconds (ref 24–37)

## 2014-07-01 NOTE — ED Notes (Signed)
Patient presents for right sided numbness, difficulty swallowing, bilateral blurriness, slurred speech onset 1400. Denies pain. A&O x4.

## 2014-07-01 NOTE — ED Provider Notes (Signed)
CSN: [id number]     Arrival date & time 07/01/14  2150 History   First MD Initiated Contact with Patient 07/01/14 2218     Chief Complaint  Patient presents with  . Numbness  . Dysphagia     (Consider location/radiation/quality/duration/timing/severity/associated sxs/prior Treatment) HPI Comments: Patient presents to the emergency department with chief complaint of slurred speech, right-sided numbness and weakness and difficulty swallowing. Patient states the symptoms started today 2:00. He states that he initially thought the symptoms would improve. His brought to the emergency department 10:30 tonight by his son. He denies any history of stroke. Denies any history of A. fib. He does not take any blood thinners. He denies any chest pain or shortness of breath. There are no aggravating or alleviating factors. Patient states that his slurred speech has improved somewhat, but he still feels numbness on his right upper and lower extremities. He also states that he cannot swallow.  The history is provided by the patient. No language interpreter was used.    Past Medical History  Diagnosis Date  . Thyroid disease   . Depression    Past Surgical History  Procedure Laterality Date  . Nose surgery     No family history on file. History  Substance Use Topics  . Smoking status: Never Smoker   . Smokeless tobacco: Not on file  . Alcohol Use: Yes     Comment: socially    Review of Systems  Constitutional: Negative for fever and chills.  Respiratory: Negative for shortness of breath.   Cardiovascular: Negative for chest pain.  Gastrointestinal: Negative for nausea, vomiting, diarrhea and constipation.  Genitourinary: Negative for dysuria.  Neurological: Positive for speech difficulty, weakness and numbness.  All other systems reviewed and are negative.     Allergies  Review of patient's allergies indicates not on file.  Home Medications   Prior to Admission medications   Not on  File   BP 150/92 mmHg  Pulse 66  Temp(Src) 97.6 F (36.4 C) (Oral)  Resp 18  SpO2 95% Physical Exam  Constitutional: He is oriented to person, place, and time. He appears well-developed and well-nourished.  HENT:  Head: Normocephalic and atraumatic.  Eyes: Conjunctivae and EOM are normal. Pupils are equal, round, and reactive to light. Right eye exhibits no discharge. Left eye exhibits no discharge. No scleral icterus.  Neck: Normal range of motion. Neck supple. No JVD present.  Cardiovascular: Normal rate, regular rhythm and normal heart sounds.  Exam reveals no gallop and no friction rub.   No murmur heard. Pulmonary/Chest: Effort normal and breath sounds normal. No respiratory distress. He has no wheezes. He has no rales. He exhibits no tenderness.  Abdominal: Soft. He exhibits no distension and no mass. There is no tenderness. There is no rebound and no guarding.  Musculoskeletal: Normal range of motion. He exhibits no edema or tenderness.  Neurological: He is alert and oriented to person, place, and time.  Sensation decreased in right upper and lower extremities, CN III-12 grossly intact, normal finger to nose, no pronator drift  Skin: Skin is warm and dry.  Psychiatric: He has a normal mood and affect. His behavior is normal. Judgment and thought content normal.  Nursing note and vitals reviewed.   ED Course  Procedures (including critical care time) Results for orders placed or performed during the hospital encounter of 07/01/14  Ethanol  Result Value Ref Range   Alcohol, Ethyl (B) <5 0 - 9 mg/dL  Protime-INR  Result Value  Ref Range   Prothrombin Time 13.0 11.6 - 15.2 seconds   INR 0.97 0.00 - 1.49  APTT  Result Value Ref Range   aPTT 26 24 - 37 seconds  CBC  Result Value Ref Range   WBC 11.5 (H) 4.0 - 10.5 K/uL   RBC 4.67 4.22 - 5.81 MIL/uL   Hemoglobin 15.1 13.0 - 17.0 g/dL   HCT 44.2 39.0 - 52.0 %   MCV 94.6 78.0 - 100.0 fL   MCH 32.3 26.0 - 34.0 pg   MCHC  34.2 30.0 - 36.0 g/dL   RDW 12.7 11.5 - 15.5 %   Platelets 273 150 - 400 K/uL  Differential  Result Value Ref Range   Neutrophils Relative % 77 43 - 77 %   Neutro Abs 8.9 (H) 1.7 - 7.7 K/uL   Lymphocytes Relative 18 12 - 46 %   Lymphs Abs 2.0 0.7 - 4.0 K/uL   Monocytes Relative 5 3 - 12 %   Monocytes Absolute 0.6 0.1 - 1.0 K/uL   Eosinophils Relative 0 0 - 5 %   Eosinophils Absolute 0.0 0.0 - 0.7 K/uL   Basophils Relative 0 0 - 1 %   Basophils Absolute 0.0 0.0 - 0.1 K/uL  Comprehensive metabolic panel  Result Value Ref Range   Sodium 139 135 - 145 mmol/L   Potassium 4.1 3.5 - 5.1 mmol/L   Chloride 102 96 - 112 mmol/L   CO2 27 19 - 32 mmol/L   Glucose, Bld 141 (H) 70 - 99 mg/dL   BUN 23 6 - 23 mg/dL   Creatinine, Ser 0.89 0.50 - 1.35 mg/dL   Calcium 8.8 8.4 - 10.5 mg/dL   Total Protein 7.0 6.0 - 8.3 g/dL   Albumin 3.8 3.5 - 5.2 g/dL   AST 29 0 - 37 U/L   ALT 31 0 - 53 U/L   Alkaline Phosphatase 66 39 - 117 U/L   Total Bilirubin 0.9 0.3 - 1.2 mg/dL   GFR calc non Af Amer 87 (L) >90 mL/min   GFR calc Af Amer >90 >90 mL/min   Anion gap 10 5 - 15  Urine Drug Screen  Result Value Ref Range   Opiates NONE DETECTED NONE DETECTED   Cocaine NONE DETECTED NONE DETECTED   Benzodiazepines NONE DETECTED NONE DETECTED   Amphetamines NONE DETECTED NONE DETECTED   Tetrahydrocannabinol NONE DETECTED NONE DETECTED   Barbiturates NONE DETECTED NONE DETECTED  Urinalysis, Routine w reflex microscopic  Result Value Ref Range   Color, Urine YELLOW YELLOW   APPearance CLOUDY (A) CLEAR   Specific Gravity, Urine 1.027 1.005 - 1.030   pH 7.0 5.0 - 8.0   Glucose, UA NEGATIVE NEGATIVE mg/dL   Hgb urine dipstick NEGATIVE NEGATIVE   Bilirubin Urine NEGATIVE NEGATIVE   Ketones, ur NEGATIVE NEGATIVE mg/dL   Protein, ur NEGATIVE NEGATIVE mg/dL   Urobilinogen, UA 0.2 0.0 - 1.0 mg/dL   Nitrite NEGATIVE NEGATIVE   Leukocytes, UA NEGATIVE NEGATIVE  CBG monitoring, ED  Result Value Ref Range    Glucose-Capillary 139 (H) 70 - 99 mg/dL   Comment 1 Notify RN    Comment 2 Document in Chart   I-Stat Chem 8, ED  Result Value Ref Range   Sodium 140 135 - 145 mmol/L   Potassium 4.0 3.5 - 5.1 mmol/L   Chloride 100 96 - 112 mmol/L   BUN 31 (H) 6 - 23 mg/dL   Creatinine, Ser 0.80 0.50 - 1.35 mg/dL  Glucose, Bld 146 (H) 70 - 99 mg/dL   Calcium, Ion 1.11 (L) 1.13 - 1.30 mmol/L   TCO2 28 0 - 100 mmol/L   Hemoglobin 16.0 13.0 - 17.0 g/dL   HCT 47.0 39.0 - 52.0 %  I-Stat Troponin, ED (not at [facility])  Result Value Ref Range   Troponin i, poc 0.00 0.00 - 0.08 ng/mL   Comment 3           Ct Head Wo Contrast  07/01/2014   CLINICAL DATA:  Patient presents for right-sided numbness, difficulty swallowing, bilateral blurry anus, and slurred speech. Onset 1400 hours.  EXAM: CT HEAD WITHOUT CONTRAST  TECHNIQUE: Contiguous axial images were obtained from the base of the skull through the vertex without intravenous contrast.  COMPARISON:  None.  FINDINGS: Diffuse cerebral atrophy. Mild ventricular dilatation consistent with central atrophy. Patchy low-attenuation changes in the deep white matter consistent with small vessel ischemia. Vascular calcifications. No mass effect or midline shift. No abnormal extra-axial fluid collections. Gray-white matter junctions are distinct. Basal cisterns are not effaced. No evidence of acute intracranial hemorrhage. No depressed skull fractures. Mucosal thickening in the paranasal sinuses with retention cyst in the left maxillary antrum and opacification of some of the ethmoid air cells. Mastoid air cells are not opacified.  IMPRESSION: No acute intracranial abnormalities. Mild chronic atrophy and small vessel ischemic changes.   Electronically Signed   By: Lucienne Capers M.D.   On: 07/01/2014 23:50     Imaging Review No results found.   EKG Interpretation   Date/Time:  Saturday July 01 2014 22:01:35 EDT Ventricular Rate:  63 PR Interval:  139 QRS Duration: 82 QT  Interval:  428 QTC Calculation: 438 R Axis:   55 Text Interpretation:  Sinus rhythm Nonspecific ST abnormality No previous  tracing Confirmed by Ashok Cordia  MD, Lennette Bihari ([id number]) on 07/01/2014 10:32:19 PM      MDM   Final diagnoses:  Numbness  Dysphagia    Patient with strokelike symptoms. Onset was today at 2:00. Patient arrived at 10:30 PM. This is outside of the 8 hour code stroke window. Additionally, he is not a candidate for TPA given the length of time since symptom onset. Patient discussed with Dr. Delice Lesch oh, who recommends proceeding with stroke was found. Advises that if CT is negative, patient will still need to be admitted for MRI in the morning. Patient still has persistent right sided upper and lower extremity deficits. I do not notice any slurred speech, but the patient states that he still notices some slurred speech.  12:23 AM Patient discussed with Dr. Janann Colonel, who agrees with plan for tx to [facility] and additional workup.  Appreciate Dr. Roel Cluck for admitting the patient.    Montine Circle, PA-C 07/02/14 [id number]  Lajean Saver, MD 07/02/14 [phone]

## 2014-07-02 ENCOUNTER — Inpatient Hospital Stay (HOSPITAL_COMMUNITY): Payer: Medicare Other

## 2014-07-02 ENCOUNTER — Encounter (HOSPITAL_COMMUNITY): Payer: Self-pay | Admitting: Internal Medicine

## 2014-07-02 DIAGNOSIS — Z66 Do not resuscitate: Secondary | ICD-10-CM | POA: Diagnosis present

## 2014-07-02 DIAGNOSIS — G8191 Hemiplegia, unspecified affecting right dominant side: Secondary | ICD-10-CM

## 2014-07-02 DIAGNOSIS — I6502 Occlusion and stenosis of left vertebral artery: Secondary | ICD-10-CM | POA: Diagnosis not present

## 2014-07-02 DIAGNOSIS — I6523 Occlusion and stenosis of bilateral carotid arteries: Secondary | ICD-10-CM | POA: Diagnosis not present

## 2014-07-02 DIAGNOSIS — I63212 Cerebral infarction due to unspecified occlusion or stenosis of left vertebral arteries: Secondary | ICD-10-CM | POA: Diagnosis present

## 2014-07-02 DIAGNOSIS — R200 Anesthesia of skin: Secondary | ICD-10-CM | POA: Diagnosis not present

## 2014-07-02 DIAGNOSIS — E785 Hyperlipidemia, unspecified: Secondary | ICD-10-CM | POA: Diagnosis not present

## 2014-07-02 DIAGNOSIS — I63012 Cerebral infarction due to thrombosis of left vertebral artery: Secondary | ICD-10-CM | POA: Diagnosis not present

## 2014-07-02 DIAGNOSIS — R4781 Slurred speech: Secondary | ICD-10-CM | POA: Diagnosis not present

## 2014-07-02 DIAGNOSIS — I639 Cerebral infarction, unspecified: Secondary | ICD-10-CM | POA: Diagnosis present

## 2014-07-02 DIAGNOSIS — R05 Cough: Secondary | ICD-10-CM | POA: Diagnosis not present

## 2014-07-02 DIAGNOSIS — G459 Transient cerebral ischemic attack, unspecified: Secondary | ICD-10-CM | POA: Insufficient documentation

## 2014-07-02 DIAGNOSIS — G8190 Hemiplegia, unspecified affecting unspecified side: Secondary | ICD-10-CM | POA: Diagnosis not present

## 2014-07-02 DIAGNOSIS — F329 Major depressive disorder, single episode, unspecified: Secondary | ICD-10-CM | POA: Diagnosis present

## 2014-07-02 DIAGNOSIS — R471 Dysarthria and anarthria: Secondary | ICD-10-CM | POA: Diagnosis present

## 2014-07-02 DIAGNOSIS — R1310 Dysphagia, unspecified: Secondary | ICD-10-CM | POA: Diagnosis not present

## 2014-07-02 DIAGNOSIS — K219 Gastro-esophageal reflux disease without esophagitis: Secondary | ICD-10-CM | POA: Diagnosis present

## 2014-07-02 DIAGNOSIS — I6789 Other cerebrovascular disease: Secondary | ICD-10-CM | POA: Diagnosis not present

## 2014-07-02 DIAGNOSIS — E039 Hypothyroidism, unspecified: Secondary | ICD-10-CM | POA: Diagnosis present

## 2014-07-02 LAB — LIPID PANEL
Cholesterol: 210 mg/dL — ABNORMAL HIGH (ref 0–200)
HDL: 44 mg/dL (ref >39)
LDL Cholesterol: 134 mg/dL — ABNORMAL HIGH (ref 0–99)
Total CHOL/HDL Ratio: 4.8 RATIO
Triglycerides: 160 mg/dL — ABNORMAL HIGH (ref <150)
VLDL: 32 mg/dL (ref 0–40)

## 2014-07-02 LAB — ETHANOL: Alcohol, Ethyl (B): <5 mg/dL (ref 0–9)

## 2014-07-02 MED ORDER — LEVOTHYROXINE SODIUM 100 MCG IV SOLR
50.0000 ug | Freq: Every day | INTRAVENOUS | Status: DC
  Administered 2014-07-02 – 2014-07-03 (×2): 50 ug via INTRAVENOUS
  Filled 2014-07-02 (×3): qty 5

## 2014-07-02 MED ORDER — ACETAMINOPHEN 650 MG RE SUPP: 650.0000 mg | RECTAL | Status: DC | PRN

## 2014-07-02 MED ORDER — ASPIRIN 325 MG PO TABS
325.0000 mg | ORAL_TABLET | Freq: Every day | ORAL | Status: DC
  Administered 2014-07-03: 325 mg via ORAL
  Filled 2014-07-02: qty 1

## 2014-07-02 MED ORDER — STROKE: EARLY STAGES OF RECOVERY BOOK
Freq: Once | Status: AC
Start: 2014-07-02 — End: 2014-07-02
  Administered 2014-07-02: 03:00:00

## 2014-07-02 MED ORDER — SODIUM CHLORIDE 0.9 % IV SOLN
INTRAVENOUS | Status: DC
  Administered 2014-07-02: 04:00:00 via INTRAVENOUS

## 2014-07-02 MED ORDER — SODIUM CHLORIDE 0.9 % IV SOLN
INTRAVENOUS | Status: AC
  Administered 2014-07-02: 10:00:00 via INTRAVENOUS

## 2014-07-02 MED ORDER — ASPIRIN 300 MG RE SUPP
300.0000 mg | Freq: Every day | RECTAL | Status: DC
  Administered 2014-07-02: 300 mg via RECTAL
  Filled 2014-07-02: qty 1

## 2014-07-02 MED ORDER — PANTOPRAZOLE SODIUM 40 MG IV SOLR
40.0000 mg | Freq: Every day | INTRAVENOUS | Status: DC
  Administered 2014-07-02 – 2014-07-03 (×2): 40 mg via INTRAVENOUS
  Filled 2014-07-02 (×2): qty 40

## 2014-07-02 MED ORDER — SODIUM CHLORIDE 0.9 % IV SOLN
INTRAVENOUS | Status: DC
  Administered 2014-07-02: 10:00:00 via INTRAVENOUS

## 2014-07-02 MED ORDER — ACETAMINOPHEN 325 MG PO TABS
650.0000 mg | ORAL_TABLET | ORAL | Status: DC | PRN
  Administered 2014-07-04 (×2): 650 mg via ORAL
  Filled 2014-07-02 (×2): qty 2

## 2014-07-02 MED ORDER — HEPARIN SODIUM (PORCINE) 5000 UNIT/ML IJ SOLN
5000.0000 [IU] | Freq: Three times a day (TID) | INTRAMUSCULAR | Status: DC
  Administered 2014-07-02 – 2014-07-04 (×6): 5000 [IU] via SUBCUTANEOUS
  Filled 2014-07-02 (×6): qty 1

## 2014-07-02 NOTE — Consult Note (Addendum)
Stroke Consult    Chief Complaint: right sided weakness and slurred speech  HPI: Daniel Quinn is an 69 y.o. male with history of thyroid disease and depression presenting with acute onset of slurred speech and right sided motor and sensory deficits. Symptoms began around 1400 but did not come to ED until 2230 when brought by his son. His current main concern is difficulty swallowing. Denies any history of CVA or TIA. He does not take a daily antiplatelet.   Date last known well: 07/01/2014 Time last known well: 1400 tPA Given: no, out of IV tPA window  Past Medical History  Diagnosis Date  . Thyroid disease   . Depression     Past Surgical History  Procedure Laterality Date  . Nose surgery      Family History  Problem Relation Age of Onset  . Stroke Mother   . COPD Father   . Diabetes type II Neg Hx   . CAD Brother    Social History:  reports that he has never smoked. He does not have any smokeless tobacco history on file. He reports that he drinks alcohol. He reports that he does not use illicit drugs.  Allergies: No Known Allergies   (Not in a hospital admission)  ROS: Out of a complete 14 system review, the patient complains of only the following symptoms, and all other reviewed systems are negative.    Physical Examination: Filed Vitals:   07/02/14 0109  BP: 187/92  Pulse: 58  Temp:   Resp: 16   Physical Exam  Constitutional: He appears well-developed and well-nourished.  Psych: Affect appropriate to situation Eyes: No scleral injection HENT: No OP obstrucion Head: Normocephalic.  Cardiovascular: Normal rate and regular rhythm.  Respiratory: Effort normal and breath sounds normal.  GI: Soft. Bowel sounds are normal. No distension. There is no tenderness.  Skin: WDI  Neurologic Examination: Mental Status: Alert, oriented, thought content appropriate.  Speech fluent without evidence of aphasia. Moderate dysarthria.  Able to follow 3 step commands  without difficulty. Cranial Nerves: II: funduscopic exam wnl bilaterally, visual fields grossly normal, pupils equal, round, reactive to light and accommodation III,IV, VI: ptosis not present, extra-ocular motions intact bilaterally V,VII: smile symmetric, facial light touch sensation normal bilaterally VIII: hearing normal bilaterally IX,X: gag reflex present XI: trapezius strength/neck flexion strength normal bilaterally XII: tongue strength normal  Motor: Right : Upper extremity    Left:     Upper extremity 5/5 deltoid       5/5 deltoid 5/5 biceps      5/5 biceps  5/5 triceps      5/5 triceps 5/5 hand grip      5/5 hand grip  Lower extremity     Lower extremity 5/5 hip flexor      5/5 hip flexor 5/5 quadricep      5/5 quadriceps  5/5 hamstrings     5/5 hamstrings 5/5 plantar flexion       5/5 plantar flexion 5/5 plantar extension     5/5 plantar extension Tone and bulk:normal tone throughout; no atrophy noted Sensory: decreased LT on RUE and RLE compared to left side Deep Tendon Reflexes: 2+ and symmetric throughout Plantars: Right: downgoing   Left: downgoing Cerebellar: normal finger-to-nose,  and normal heel-to-shin test Gait: normal gait and station  Laboratory Studies:   Basic Metabolic Panel:  Recent Labs Lab 07/01/14 2251 07/01/14 2257  NA 139 140  K 4.1 4.0  CL 102 100  CO2 27  --  GLUCOSE 141* 146*  BUN 23 31*  CREATININE 0.89 0.80  CALCIUM 8.8  --     Liver Function Tests:  Recent Labs Lab 07/01/14 2251  AST 29  ALT 31  ALKPHOS 66  BILITOT 0.9  PROT 7.0  ALBUMIN 3.8   No results for input(s): LIPASE, AMYLASE in the last 168 hours. No results for input(s): AMMONIA in the last 168 hours.  CBC:  Recent Labs Lab 07/01/14 2251 07/01/14 2257  WBC 11.5*  --   NEUTROABS 8.9*  --   HGB 15.1 16.0  HCT 44.2 47.0  MCV 94.6  --   PLT 273  --     Cardiac Enzymes: No results for input(s): CKTOTAL, CKMB, CKMBINDEX, TROPONINI in the last 168  hours.  BNP: Invalid input(s): POCBNP  CBG:  Recent Labs Lab 07/01/14 2251  GLUCAP 139*    Microbiology: No results found for this or any previous visit.  Coagulation Studies:  Recent Labs  07/01/14 2251  LABPROT 13.0  INR 0.97    Urinalysis:  Recent Labs Lab 07/01/14 2301  COLORURINE YELLOW  LABSPEC 1.027  PHURINE 7.0  GLUCOSEU NEGATIVE  HGBUR NEGATIVE  BILIRUBINUR NEGATIVE  KETONESUR NEGATIVE  PROTEINUR NEGATIVE  UROBILINOGEN 0.2  NITRITE NEGATIVE  LEUKOCYTESUR NEGATIVE    Lipid Panel:  No results found for: CHOL, TRIG, HDL, CHOLHDL, VLDL, LDLCALC  HgbA1C: No results found for: HGBA1C  Urine Drug Screen:     Component Value Date/Time   LABOPIA NONE DETECTED 07/01/2014 2301   COCAINSCRNUR NONE DETECTED 07/01/2014 2301   LABBENZ NONE DETECTED 07/01/2014 2301   AMPHETMU NONE DETECTED 07/01/2014 2301   THCU NONE DETECTED 07/01/2014 2301   LABBARB NONE DETECTED 07/01/2014 2301    Alcohol Level:  Recent Labs Lab 07/01/14 2251  ETH <5    Other results: EKG: normal EKG, normal sinus rhythm.  Imaging: Ct Head Wo Contrast  07/01/2014   CLINICAL DATA:  Patient presents for right-sided numbness, difficulty swallowing, bilateral blurry anus, and slurred speech. Onset 1400 hours.  EXAM: CT HEAD WITHOUT CONTRAST  TECHNIQUE: Contiguous axial images were obtained from the base of the skull through the vertex without intravenous contrast.  COMPARISON:  None.  FINDINGS: Diffuse cerebral atrophy. Mild ventricular dilatation consistent with central atrophy. Patchy low-attenuation changes in the deep white matter consistent with small vessel ischemia. Vascular calcifications. No mass effect or midline shift. No abnormal extra-axial fluid collections. Gray-white matter junctions are distinct. Basal cisterns are not effaced. No evidence of acute intracranial hemorrhage. No depressed skull fractures. Mucosal thickening in the paranasal sinuses with retention cyst in the  left maxillary antrum and opacification of some of the ethmoid air cells. Mastoid air cells are not opacified.  IMPRESSION: No acute intracranial abnormalities. Mild chronic atrophy and small vessel ischemic changes.   Electronically Signed   By: Lucienne Capers M.D.   On: 07/01/2014 23:50    Assessment: 69 y.o. male with history of depression and thyroid disease presenting with acute onset of slurred speech and right sided motor/sensory symptoms. Admitted for further stroke workup.    Plan: 1. HgbA1c, fasting lipid panel 2. MRI, MRA  of the brain without contrast 3. PT consult, OT consult, Speech consult 4. Echocardiogram 5. Carotid dopplers 6. Prophylactic therapy-ASA 325mg  daily 7. Risk factor modification 8. Telemetry monitoring 9. Frequent neuro checks 10. NPO until RN stroke swallow screen     Jim Like, DO [facility] [phone]  If 7pm- 7am, please page neurology on call as listed in  [facility]. 07/02/2014, 1:40 AM

## 2014-07-02 NOTE — Evaluation (Signed)
Clinical/Bedside Swallow Evaluation Patient Details  Name: Daniel Quinn MRN: [id number] Date of Birth: [birth year]  Today's Date: 07/02/2014 Time: SLP Start Time (ACUTE ONLY): 1145 SLP Stop Time (ACUTE ONLY): 1230 SLP Time Calculation (min) (ACUTE ONLY): 45 min  Past Medical History:  Past Medical History  Diagnosis Date  . Thyroid disease   . Depression    Past Surgical History:  Past Surgical History  Procedure Laterality Date  . Nose surgery     HPI:  Patient with no prior hx of CVA, presents with sudden onset at 2 PM on 07/02/14 of Right sided numbness and weakness, light-headedness, he stated he is unable to swallow. Reports slurred speech, reports being off balance and blurred vision, he reported diminished sensation along the right side of his body when he got in the shower he could not feel the hot water. . Patient arrived to ED at 10 PM and at this point he was out of window for any thrombolytic intervention; MRI revealed Linear 11 mm hyperintense focus on DWI sequence within the left lateral medulla as above, suspicious for possible small acute infarction.   Assessment / Plan / Recommendation Clinical Impression   Pt presents with neurogenic dysphagia characterized by s/s of aspiration including immediate cough with thin via cup, delayed cough with puree and thin via tsp; all consistencies included multiple swallows (i.e.: ice chips, thin,puree) and probable suspected swallow delay; pt c/o globus sensation and "tightness" when swallowing; he has been expelling saliva from oral cavity consistently and stating he "cannot swallow."  He was able to swallow saliva when instructed during BSE, but expelled various consistencies from pharynx intermittently during limited BSE; MBS recommended to determine if any diet/liquid consistency is safe at present time and swallow precautions to follow due to severity of dysphagia and type of CVA.    Aspiration Risk  Severe    Diet  Recommendation NPO   Medication Administration: Via alternative means    Other  Recommendations Recommended Consults: MBS   Follow Up Recommendations  Outpatient SLP    Frequency and Duration min 2x/week  2 weeks   Pertinent Vitals/Pain Low grade fever    SLP Swallow Goals  See POC; readiness for po intake pending MBS   Swallow Study Prior Functional Status   Independent    General Date of Onset: 07/02/14 HPI: Patient with no prior hx of CVA, presents with sudden onset at 2 PM on 07/02/14 of Right sided numbness and weakness, light-headedness, he stated he is unable to swallow. Reports slurred speech, reports being off balance and blurred vision, he reported diminished sensation along the right side of his body when he got in the shower he could not feel the hot water. . Patient arrived to ED at 10 PM and at this point he was out of window for any thrombolytic intervention Type of Study: Bedside swallow evaluation Diet Prior to this Study: NPO Temperature Spikes Noted: No Respiratory Status: Room air History of Recent Intubation: No Behavior/Cognition: Alert;Cooperative;Pleasant mood Oral Cavity - Dentition: Adequate natural dentition Self-Feeding Abilities: Able to feed self Patient Positioning: Upright in bed Baseline Vocal Quality: Hoarse;Low vocal intensity Volitional Cough: Strong Volitional Swallow: Able to elicit    Oral/Motor/Sensory Function Overall Oral Motor/Sensory Function: Impaired Labial ROM: Reduced right Labial Symmetry: Abnormal symmetry right Labial Strength: Reduced Labial Sensation: Reduced Lingual ROM: Reduced right Lingual Symmetry: Abnormal symmetry right;Other (Comment) (minimal) Lingual Strength: Reduced Lingual Sensation: Reduced Facial ROM: Reduced right Facial Strength: Reduced Facial Sensation: Reduced Velum: Within  Functional Limits Mandible: Impaired   Ice Chips Ice chips: Impaired Presentation: Spoon Pharyngeal Phase Impairments:  Suspected delayed Swallow   Thin Liquid Thin Liquid: Impaired Presentation: Cup;Spoon Pharyngeal  Phase Impairments: Suspected delayed Swallow;Multiple swallows;Cough - Immediate    Nectar Thick Nectar Thick Liquid: Not tested   Honey Thick Honey Thick Liquid: Not tested   Puree Puree: Impaired Presentation: Spoon Pharyngeal Phase Impairments: Multiple swallows;Suspected delayed Swallow;Cough - Delayed;Other (comments) (expulsion of bolus from oral cavity)   Solid       Solid: Not tested       Jaleisa Brose,PAT, M.S., CCC-SLP 07/02/2014,1:19 PM

## 2014-07-02 NOTE — H&P (Addendum)
PCP: Merwyn Katos   Chief Complaint:  Right side weakness and slurred speech   HPI: Daniel Quinn is a 69 y.o. male   has a past medical history of Thyroid disease and Depression.   Presented with  Patient with no prior hx of CVA presents with Sudden onset at 2 PM of Right sided numbness and weakness, light-headedness, he states he is unable to swallow. Reports slurred speech, reports being off balance and  blurred vision, he reports diminished sensation along the right side of his body when he got in the shower he could not feel the hot water. . Patient arriveded to emergency department at 10 PM. This point he was out of window for any thrombolytic intervention.  CT scan of the head no acute findings. ER provider spoke to Daniel Quinn with Neurology who recommends transfer to [facility] for stroke workup. Of note patient had recently had a lot of allergy symptoms was treated with short less than one week prednisone taper which she is almost completed. Hospitalist was called for admission for TIA vs CVA  Review of Systems:    Pertinent positives include:  Right-sided weakness tingling,   weakness,  slurred speech, gait abnormality,  Constitutional:  No weight loss, night sweats, Fevers, chills, fatigue, weight loss  HEENT:  No headaches, Difficulty swallowing,Tooth/dental problems,Sore throat,  No sneezing, itching, ear ache, nasal congestion, post nasal drip,  Cardio-vascular:  No chest pain, Orthopnea, PND, anasarca, dizziness, palpitations.no Bilateral lower extremity swelling  GI:  No heartburn, indigestion, abdominal pain, nausea, vomiting, diarrhea, change in bowel habits, loss of appetite, melena, blood in stool, hematemesis Resp:  no shortness of breath at rest. No dyspnea on exertion, No excess mucus, no productive cough, No non-productive cough, No coughing up of blood.No change in color of mucus.No wheezing. Skin:  no rash or lesions. No jaundice GU:  no  dysuria, change in color of urine, no urgency or frequency. No straining to urinate.  No flank pain.  Musculoskeletal:  No joint pain or no joint swelling. No decreased range of motion. No back pain.  Psych:  No change in mood or affect. No depression or anxiety. No memory loss.  Neuro:     no double vision, no no  confusion  Otherwise ROS are negative except for above, 10 systems were reviewed  Past Medical History: Past Medical History  Diagnosis Date  . Thyroid disease   . Depression    Past Surgical History  Procedure Laterality Date  . Nose surgery       Medications: Prior to Admission medications   Medication Sig Start Date End Date Taking? Authorizing Provider  Ascorbic Acid (VITAMIN C) 1000 MG tablet Take 1,000 mg by mouth daily.   Yes Historical Provider, MD  azelastine (ASTELIN) 0.1 % nasal spray Place 2 sprays into both nostrils 2 (two) times daily. Use in each nostril as directed   Yes Historical Provider, MD  cefdinir (OMNICEF) 300 MG capsule Take 300 mg by mouth 2 (two) times daily. 06/27/14  Yes Historical Provider, MD  FLUoxetine (PROZAC) 20 MG capsule Take 20 mg by mouth daily.   Yes Historical Provider, MD  fluticasone (FLONASE) 50 MCG/ACT nasal spray Place 1 spray into both nostrils daily.   Yes Historical Provider, MD  ibuprofen (ADVIL,MOTRIN) 200 MG tablet Take 400 mg by mouth every 6 (six) hours as needed for moderate pain.   Yes Historical Provider, MD  levocetirizine (XYZAL) 5 MG tablet Take 5 mg by mouth  daily.   Yes Historical Provider, MD  levothyroxine (SYNTHROID, LEVOTHROID) 112 MCG tablet Take 112 mcg by mouth daily before breakfast.   Yes Historical Provider, MD  Multiple Vitamin (MULTIVITAMIN WITH MINERALS) TABS tablet Take 1 tablet by mouth daily.   Yes Historical Provider, MD  predniSONE (DELTASONE) 10 MG tablet Take 10-40 mg by mouth daily with breakfast. Pt started on 06-27-14. Pt is on a taper dose. 40 mg x 1, 30 mg x 1, 20 mg x 1, 10 mg x 4 days. Pt  is on the qd. 06/30/14  Yes Historical Provider, MD    Allergies:  No Known Allergies  Social History:  Ambulatory  independently   Lives at home alone,       reports that he has never smoked. He does not have any smokeless tobacco history on file. He reports that he drinks alcohol. He reports that he does not use illicit drugs.    Family History: family history is not on file.    Physical Exam: Patient Vitals for the past 24 hrs:  BP Temp Temp src Pulse Resp SpO2  07/01/14 2155 150/92 mmHg 97.6 F (36.4 C) Oral 66 18 95 %   1. General:  in No Acute distress 2. Psychological: Alert and  Oriented 3. Head/ENT:     Dry Mucous Membranes                          Head Non traumatic, neck supple                          Normal  Dentition 4. SKIN:  decreased Skin turgor,  Skin clean Dry and intact no rash 5. Heart: Regular rate and rhythm no Murmur, Rub or gallop 6. Lungs: Clear to auscultation bilaterally, no wheezes or crackles   7. Abdomen: Soft, non-tender, Non distended 8. Lower extremities: no clubbing, cyanosis, or edema 9. Neurologically  strength 5 out of 5 in all 4 extremities cranial nerves appear to be intact there is a diminished sensation and gag reflex on the right side of the soft palate diminished sensation of her right upper lower extremity. Pupils on the left somewhat less reactive 10. MSK: Normal range of motion  body mass index is unknown because there is no height or weight on file.   Labs on Admission:   Results for orders placed or performed during the hospital encounter of 07/01/14 (from the past 24 hour(s))  CBG monitoring, ED     Status: Abnormal   Collection Time: 07/01/14 10:51 PM  Result Value Ref Range   Glucose-Capillary 139 (H) 70 - 99 mg/dL   Comment 1 Notify RN    Comment 2 Document in Chart   Ethanol     Status: None   Collection Time: 07/01/14 10:51 PM  Result Value Ref Range   Alcohol, Ethyl (B) <5 0 - 9 mg/dL  Protime-INR     Status:  None   Collection Time: 07/01/14 10:51 PM  Result Value Ref Range   Prothrombin Time 13.0 11.6 - 15.2 seconds   INR 0.97 0.00 - 1.49  APTT     Status: None   Collection Time: 07/01/14 10:51 PM  Result Value Ref Range   aPTT 26 24 - 37 seconds  CBC     Status: Abnormal   Collection Time: 07/01/14 10:51 PM  Result Value Ref Range   WBC 11.5 (H) 4.0 - 10.5  K/uL   RBC 4.67 4.22 - 5.81 MIL/uL   Hemoglobin 15.1 13.0 - 17.0 g/dL   HCT 44.2 39.0 - 52.0 %   MCV 94.6 78.0 - 100.0 fL   MCH 32.3 26.0 - 34.0 pg   MCHC 34.2 30.0 - 36.0 g/dL   RDW 12.7 11.5 - 15.5 %   Platelets 273 150 - 400 K/uL  Differential     Status: Abnormal   Collection Time: 07/01/14 10:51 PM  Result Value Ref Range   Neutrophils Relative % 77 43 - 77 %   Neutro Abs 8.9 (H) 1.7 - 7.7 K/uL   Lymphocytes Relative 18 12 - 46 %   Lymphs Abs 2.0 0.7 - 4.0 K/uL   Monocytes Relative 5 3 - 12 %   Monocytes Absolute 0.6 0.1 - 1.0 K/uL   Eosinophils Relative 0 0 - 5 %   Eosinophils Absolute 0.0 0.0 - 0.7 K/uL   Basophils Relative 0 0 - 1 %   Basophils Absolute 0.0 0.0 - 0.1 K/uL  Comprehensive metabolic panel     Status: Abnormal   Collection Time: 07/01/14 10:51 PM  Result Value Ref Range   Sodium 139 135 - 145 mmol/L   Potassium 4.1 3.5 - 5.1 mmol/L   Chloride 102 96 - 112 mmol/L   CO2 27 19 - 32 mmol/L   Glucose, Bld 141 (H) 70 - 99 mg/dL   BUN 23 6 - 23 mg/dL   Creatinine, Ser 0.89 0.50 - 1.35 mg/dL   Calcium 8.8 8.4 - 10.5 mg/dL   Total Protein 7.0 6.0 - 8.3 g/dL   Albumin 3.8 3.5 - 5.2 g/dL   AST 29 0 - 37 U/L   ALT 31 0 - 53 U/L   Alkaline Phosphatase 66 39 - 117 U/L   Total Bilirubin 0.9 0.3 - 1.2 mg/dL   GFR calc non Af Amer 87 (L) >90 mL/min   GFR calc Af Amer >90 >90 mL/min   Anion gap 10 5 - 15  I-Stat Troponin, ED (not at [facility])     Status: None   Collection Time: 07/01/14 10:55 PM  Result Value Ref Range   Troponin i, poc 0.00 0.00 - 0.08 ng/mL   Comment 3          I-Stat Chem 8, ED     Status:  Abnormal   Collection Time: 07/01/14 10:57 PM  Result Value Ref Range   Sodium 140 135 - 145 mmol/L   Potassium 4.0 3.5 - 5.1 mmol/L   Chloride 100 96 - 112 mmol/L   BUN 31 (H) 6 - 23 mg/dL   Creatinine, Ser 0.80 0.50 - 1.35 mg/dL   Glucose, Bld 146 (H) 70 - 99 mg/dL   Calcium, Ion 1.11 (L) 1.13 - 1.30 mmol/L   TCO2 28 0 - 100 mmol/L   Hemoglobin 16.0 13.0 - 17.0 g/dL   HCT 47.0 39.0 - 52.0 %  Urine Drug Screen     Status: None   Collection Time: 07/01/14 11:01 PM  Result Value Ref Range   Opiates NONE DETECTED NONE DETECTED   Cocaine NONE DETECTED NONE DETECTED   Benzodiazepines NONE DETECTED NONE DETECTED   Amphetamines NONE DETECTED NONE DETECTED   Tetrahydrocannabinol NONE DETECTED NONE DETECTED   Barbiturates NONE DETECTED NONE DETECTED  Urinalysis, Routine w reflex microscopic     Status: Abnormal   Collection Time: 07/01/14 11:01 PM  Result Value Ref Range   Color, Urine YELLOW YELLOW   APPearance CLOUDY (  A) CLEAR   Specific Gravity, Urine 1.027 1.005 - 1.030   pH 7.0 5.0 - 8.0   Glucose, UA NEGATIVE NEGATIVE mg/dL   Hgb urine dipstick NEGATIVE NEGATIVE   Bilirubin Urine NEGATIVE NEGATIVE   Ketones, ur NEGATIVE NEGATIVE mg/dL   Protein, ur NEGATIVE NEGATIVE mg/dL   Urobilinogen, UA 0.2 0.0 - 1.0 mg/dL   Nitrite NEGATIVE NEGATIVE   Leukocytes, UA NEGATIVE NEGATIVE    UA cloudy no evidence of UTI   No results found for: HGBA1C  CrCl cannot be calculated (Unknown ideal weight.).  BNP (last 3 results) No results for input(s): PROBNP in the last 8760 hours.  Other results:  I have pearsonaly reviewed this: ECG REPORT  Rate: 63  Rhythm: Normal sinus rhythm ST&T Change: No significant ST changes   There were no vitals filed for this visit.   Cultures: No results found for: SDES, SPECREQUEST, CULT, REPTSTATUS   Radiological Exams on Admission: Ct Head Wo Contrast  07/01/2014   CLINICAL DATA:  Patient presents for right-sided numbness, difficulty  swallowing, bilateral blurry anus, and slurred speech. Onset 1400 hours.  EXAM: CT HEAD WITHOUT CONTRAST  TECHNIQUE: Contiguous axial images were obtained from the base of the skull through the vertex without intravenous contrast.  COMPARISON:  None.  FINDINGS: Diffuse cerebral atrophy. Mild ventricular dilatation consistent with central atrophy. Patchy low-attenuation changes in the deep white matter consistent with small vessel ischemia. Vascular calcifications. No mass effect or midline shift. No abnormal extra-axial fluid collections. Gray-white matter junctions are distinct. Basal cisterns are not effaced. No evidence of acute intracranial hemorrhage. No depressed skull fractures. Mucosal thickening in the paranasal sinuses with retention cyst in the left maxillary antrum and opacification of some of the ethmoid air cells. Mastoid air cells are not opacified.  IMPRESSION: No acute intracranial abnormalities. Mild chronic atrophy and small vessel ischemic changes.   Electronically Signed   By: Lucienne Capers M.D.   On: 07/01/2014 23:50    Chart has been reviewed  Assessment/Plan  69 year old gentleman with no significant risk factors presents with sudden onset of right-sided weakness and loss of sensation to the right side of her body, slurred speech and difficulty swallowing  Present on Admission:  . CVA (cerebral infarction) - versus TIA. [facility] to [facility] - will admit based on TIA/CVA protocol, await results of MRA/MRI, Carotid Doppler and Echo, obtain cardiac enzymes,  ECG,   Lipid panel, TSH. Order PT/OT evaluation. Will make sure patient is on antiplatelet agent.   Neurology consult has been called by ER provider.     Difficulty swallowing - will have speech pathology evaluate aspiration precautions and by mouth History of hypothyroidism we'll continue Synthroid IV History of allergies - will discontinue prednisone taper as he is almost done  Prophylaxis: SCD    CODE STATUS:   DNR/DNI  as per patient's wishes  Other plan as per orders.  I have spent a total of 55 min on this admission  Daniel Quinn 07/02/2014, 12:18 AM  [facility]  Pager [phone]   after 2 AM please page floor coverage PA If 7AM-7PM, please contact the day team taking care of the patient  [email or web address]  Password [email or web address]

## 2014-07-02 NOTE — Progress Notes (Signed)
Utilization review completed.  

## 2014-07-02 NOTE — Progress Notes (Signed)
STROKE TEAM PROGRESS NOTE   HISTORY Daniel Quinn is a 69 y.o. male with history of thyroid disease and depression presenting with acute onset of slurred speech and right sided motor and sensory deficits. Symptoms began around 1400 but did not come to ED until 2230 when brought by his son. His current main concern is difficulty swallowing. Denies any history of CVA or TIA. He does not take a daily antiplatelet.   Date last known well: 07/01/2014 Time last known well: 1400 tPA Given: no, out of IV tPA window   SUBJECTIVE (INTERVAL HISTORY) No family members present. The patient is still dysarthric. He is still unable to swallow even his saliva. We briefly discussed his blood pressure. He is aware that it is running high. He tells me he does not have a history of hypertension. He saw his family physician, Dr. Sheryn Bison, several months ago and apparently his blood pressure was controlled that time. Otherwise he voices no complaints.   OBJECTIVE Temp:  [97.6 F (36.4 C)-98.6 F (37 C)] 98.6 F (37 C) (04/10 0800) Pulse Rate:  [58-66] 61 (04/10 0800) Cardiac Rhythm:  [-] Sinus bradycardia (04/10 0300) Resp:  [12-20] 20 (04/10 0800) BP: (150-199)/(73-92) 177/83 mmHg (04/10 0800) SpO2:  [95 %-99 %] 99 % (04/10 0800) Weight:  [75.841 kg (167 lb 3.2 oz)] 75.841 kg (167 lb 3.2 oz) (04/10 0221)   Recent Labs Lab 07/01/14 2251  GLUCAP 139*    Recent Labs Lab 07/01/14 2251 07/01/14 2257  NA 139 140  K 4.1 4.0  CL 102 100  CO2 27  --   GLUCOSE 141* 146*  BUN 23 31*  CREATININE 0.89 0.80  CALCIUM 8.8  --     Recent Labs Lab 07/01/14 2251  AST 29  ALT 31  ALKPHOS 66  BILITOT 0.9  PROT 7.0  ALBUMIN 3.8    Recent Labs Lab 07/01/14 2251 07/01/14 2257  WBC 11.5*  --   NEUTROABS 8.9*  --   HGB 15.1 16.0  HCT 44.2 47.0  MCV 94.6  --   PLT 273  --    No results for input(s): CKTOTAL, CKMB, CKMBINDEX, TROPONINI in the last 168 hours.  Recent Labs  07/01/14 2251   LABPROT 13.0  INR 0.97    Recent Labs  07/01/14 2301  COLORURINE YELLOW  LABSPEC 1.027  PHURINE 7.0  GLUCOSEU NEGATIVE  HGBUR NEGATIVE  BILIRUBINUR NEGATIVE  KETONESUR NEGATIVE  PROTEINUR NEGATIVE  UROBILINOGEN 0.2  NITRITE NEGATIVE  LEUKOCYTESUR NEGATIVE    No results found for: CHOL, TRIG, HDL, CHOLHDL, VLDL, LDLCALC No results found for: HGBA1C    Component Value Date/Time   LABOPIA NONE DETECTED 07/01/2014 2301   COCAINSCRNUR NONE DETECTED 07/01/2014 2301   LABBENZ NONE DETECTED 07/01/2014 2301   AMPHETMU NONE DETECTED 07/01/2014 2301   THCU NONE DETECTED 07/01/2014 2301   LABBARB NONE DETECTED 07/01/2014 2301     Recent Labs Lab 07/01/14 2251  ETH <5    Ct Head Wo Contrast 07/01/2014    No acute intracranial abnormalities. Mild chronic atrophy and small vessel ischemic changes.      Mr Brain Wo Contrast 07/02/2014     MRI HEAD   1. Linear 11 mm hyperintense focus on DWI sequence within the left lateral medulla as above, suspicious for possible small acute ischemic infarct. Correlation with symptomatology for possible acute ischemic infarct at this location recommended.  2. Remote lacunar infarcts involving the bilateral thalami.  3. Age-related cerebral atrophy.    MRA  HEAD 1. 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 There is some flow within the distal left vertebral artery prior to vertebrobasilar junction via collateral flow from the right vertebral artery system.  2. Short-segment severe stenosis within the mid left P2 segment.  3. Mild multi focal atheromatous irregularity within the cavernous ICAs bilaterally with associated mild multi focal narrowing.  4. Mild distal branch atheromatous irregularity within the MCA and PCA branches bilaterally.       PHYSICAL EXAM NEUROLOGIC:   MENTAL STATUS: awake, alert, oriented, follows all commands. Still dysarthric. Repeatedly spits saliva into a container. CRANIAL NERVES: pupils equal and reactive to  light,extraocular muscles intact, facial sensation and strength symmetric, tongue midline MOTOR: normal bulk and tone, Strength - 5/5 throughout except right grip strength 4/5 SENSORY: normal and symmetric to light touch in all areas except the right upper extremity. COORDINATION: finger-nose-finger normal      ASSESSMENT/PLAN [name] is a 69 y.o. male with history of depression and thyroid disease presenting with acute onset of slurred speech and right hemiparesis with right hemisensory deficits.. He did not receive IV t-PA due to late presentation.   Stroke:  Dominant - left lateral medulla  Resultant - dysarthria, dysphasia, and mild right upper extremity sensory deficits.  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  Carotid Doppler - pending  2D Echo - pending  LDL - pending  HgbA1c pending  SCDs for VTE prophylaxis  Diet NPO time specified  no antithrombotic prior to admission, now on aspirin 300 mg suppository daily  Patient counseled to be compliant with his antithrombotic medications  Ongoing aggressive stroke risk factor management  Therapy recommendations:  Pending  Disposition:  Pending  Hypertension  Home meds:  No antihypertensive medications prior to admission.  Blood pressure running high.  Permissive hypertension <220/120 for 24-48 hours and then gradually normalize within 5-7 days    Other Stroke Risk Factors  Advanced age  ETOH use  Family hx stroke (mother)   Other Active Problems  Mild anemia  Mildly elevated BUN  NPO   PLAN  Consider dual antiplatelet therapy when patient can swallow or other access is available secondary to severe stenosis  within the mid left P2 segment.  Hospital day # 0  Mikey Bussing [facility] Pager [phone] 07/02/2014, 9:37 AM  Lenor Coffin [phone]    To  contact Stroke Continuity provider, please refer to [email or web address] After hours, contact General Neurology

## 2014-07-02 NOTE — Progress Notes (Signed)
Patient Demographics  Daniel Quinn, is a 69 y.o. male, DOB - 1945-12-11, VHQ:[id number]  Admit date - 07/01/2014   Admitting Physician Toy Baker, MD  Outpatient Primary MD for the patient is No primary care provider on file.  LOS - 0   Chief Complaint  Patient presents with  . Numbness  . Dysphagia        Subjective:   Daniel Quinn today has, No headache, No chest pain, No abdominal pain - No Nausea, No new weakness tingling or numbness, No Cough - SOB. Weakness is resolved but still having some dysphagia.  Assessment & Plan    1. 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 Does have ongoing dysphagia and mild right facial droop, right-sided weakness has resolved. Full stroke workup underway, monitor on tele, obtain PT, OT, speech input, A1c and lipid panel, check echogram, carotid duplex . On aspirin rectally for now. Further management per neurology. ? Neuro IR.   No results found for: HGBA1C  No results found for: CHOL, HDL, LDLCALC, LDLDIRECT, TRIG, CHOLHDL   2. Hypothyroidism. For now on IV Synthroid.   3. GERD. On PPI.    Code Status: Full  Family Communication: None  Disposition Plan: To be decided   Procedures   CT head, MRI/MRA brain, echogram, carotid duplex.   Consults Neuro   Medications  Scheduled Meds: . aspirin  300 mg Rectal Daily   Or  . aspirin  325 mg Oral Daily  . levothyroxine  50 mcg Intravenous Daily  . pantoprazole (PROTONIX) IV  40 mg Intravenous QHS   Continuous Infusions: . sodium chloride     PRN Meds:.acetaminophen **OR** acetaminophen  DVT Prophylaxis    Heparin    Lab Results  Component Value Date   PLT 273 07/01/2014    Antibiotics     Anti-infectives    None          Objective:    Filed Vitals:   07/02/14 0221 07/02/14 0415 07/02/14 0612 07/02/14 0800  BP: 199/88 186/92 193/91 177/83  Pulse: 59 58 58 61  Temp: 98.4 F (36.9 C) 98.4 F (36.9 C) 98.2 F (36.8 C) 98.6 F (37 C)  TempSrc: Oral Oral Oral Oral  Resp: 18 18 18 20   Height: 5\' 7"  (1.702 m)     Weight: 75.841 kg (167 lb 3.2 oz)     SpO2: 98% 98% 98% 99%    Wt Readings from Last 3 Encounters:  07/02/14 75.841 kg (167 lb 3.2 oz)     Intake/Output Summary (Last 24 hours) at 07/02/14 0948 Last data filed at 07/02/14 0903  Gross per 24 hour  Intake      0 ml  Output    150 ml  Net   -150 ml     Physical Exam  Awake Alert, Oriented X 3, No new F.N deficits, Normal affect, speech and weakness better but still having dysphagia  [place].AT,PERRAL Supple Neck,No JVD, No cervical lymphadenopathy appriciated.  Symmetrical Chest wall movement, Good air movement bilaterally, CTAB RRR,No Gallops,Rubs or new Murmurs, No Parasternal Heave +ve B.Sounds, Abd Soft, No tenderness, No organomegaly appriciated, No rebound - guarding or rigidity. No Cyanosis, Clubbing or edema, No new Rash or bruise  Data Review   Micro Results No results found for this or any previous visit (from the past 240 hour(s)).  Radiology Reports Dg Chest 2 View  07/02/2014   CLINICAL DATA:  Cough, dysphagia starting last night, question CVA  EXAM: CHEST  2 VIEW  COMPARISON:  None.  FINDINGS: Cardiomediastinal silhouette is unremarkable. No acute infiltrate or pleural effusion. No pulmonary edema. Bony thorax is unremarkable.  IMPRESSION: No active cardiopulmonary disease.   Electronically Signed   By: Lahoma Crocker M.D.   On: 07/02/2014 09:44   Ct Head Wo Contrast  07/01/2014   CLINICAL DATA:  Patient presents for right-sided numbness, difficulty swallowing, bilateral blurry anus, and slurred speech. Onset 1400 hours.  EXAM: CT HEAD WITHOUT CONTRAST  TECHNIQUE: Contiguous axial images were obtained from the base of the skull through  the vertex without intravenous contrast.  COMPARISON:  None.  FINDINGS: Diffuse cerebral atrophy. Mild ventricular dilatation consistent with central atrophy. Patchy low-attenuation changes in the deep white matter consistent with small vessel ischemia. Vascular calcifications. No mass effect or midline shift. No abnormal extra-axial fluid collections. Gray-white matter junctions are distinct. Basal cisterns are not effaced. No evidence of acute intracranial hemorrhage. No depressed skull fractures. Mucosal thickening in the paranasal sinuses with retention cyst in the left maxillary antrum and opacification of some of the ethmoid air cells. Mastoid air cells are not opacified.  IMPRESSION: No acute intracranial abnormalities. Mild chronic atrophy and small vessel ischemic changes.   Electronically Signed   By: Lucienne Capers M.D.   On: 07/01/2014 23:50   Mr Brain Wo Contrast  07/02/2014   CLINICAL DATA:  Initial evaluation for acute onset of slurred speech, right-sided motor/ sensory symptoms.  EXAM: MRI HEAD WITHOUT CONTRAST  MRA HEAD WITHOUT CONTRAST  TECHNIQUE: Multiplanar, multiecho pulse sequences of the brain and surrounding structures were obtained without intravenous contrast. Angiographic images of the head were obtained using MRA technique without contrast.  COMPARISON:  Prior CT from 07/01/2014.  FINDINGS: MRI HEAD FINDINGS  Diffuse prominence of the CSF containing spaces is compatible with generalized cerebral atrophy. Very mild chronic small vessel ischemic type changes present within the periventricular white matter. There are small remote lacunar infarcts involving the bilateral thalami.  On DWI sequence, there is apparent 11 mm hyperintense signal intensity seen within the left aspect of the lower medulla (series 4, image 9 on axial DWI sequence, series 7, image 17, on coronal DWI sequence). There is question of subtly increased signal intensity within this region on T2/FLAIR sequences. Finding  suspicious for a small acute ischemic infarct, although possible artifact could also be considered.  No other abnormal foci of restricted diffusion to suggest acute intracranial infarct identified. Gray-white matter differentiation otherwise maintained.  No acute or chronic intracranial hemorrhage.  No mass lesion or midline shift. No hydrocephalus. No extra-axial fluid collection.  Craniocervical junction within normal limits. Pituitary gland normal. No acute abnormality about the orbits. Small retention cyst present within the left maxillary sinus. There is scattered opacity within the anterior ethmoidal air cells, left greater than right. No air-fluid level to suggest active sinus infection. No mastoid effusion. Inner ear structures normal.  Bone marrow signal intensity within normal limits. Scalp soft tissues unremarkable.  MRA HEAD FINDINGS  ANTERIOR CIRCULATION:  Visualized portions of the distal cervical segments of the internal carotid arteries are widely patent with antegrade flow. The petrous segments are widely patent. Multi focal atheromatous irregularity present within the cavernous segments of the internal carotid arteries  bilaterally with associated mild multi focal stenoses. The supraclinoid segments are widely patent.  Right A1 segment is hypoplastic but patent. Left A1 segment widely patent. Anterior communicating artery and anterior cerebral arteries well opacified.  M1 segments widely patent without stenosis or occlusion. Right MCA bifurcates. Left MCA trifurcates. Distal MCA branches fairly symmetric and well opacified bilaterally. Distal MCA branch atheromatous irregularity present.  POSTERIOR CIRCULATION:  Mild atheromatous irregularity present within the proximal right vertebral artery as it courses into the cranial vault. The right vertebral artery is otherwise well opacified to the vertebrobasilar junction. The left vertebral artery is not visualize, occluded in the neck. There is some flow  within the distal left vertebral artery just prior to the vertebrobasilar junction likely via flow from the contralateral right vertebral artery system. Posterior inferior cerebral arteries are patent. Anterior inferior cerebellar arteries opacified proximally. Minimal atheromatous irregularity present within the basilar artery without stenosis or occlusion. Superior cerebellar arteries patent bilaterally. There is a short segment severe stenosis within the mid left P2 segment (series 505, image 5). Left posterior cerebral artery otherwise widely patent. Right P1 segment is hypoplastic with a prominent right posterior communicating artery. Right P2 segment well opacified. Distal branch irregularity present within the PCA arteries bilaterally.  No aneurysm or vascular malformation.  IMPRESSION: MRI HEAD IMPRESSION:  1. Linear 11 mm hyperintense focus on DWI sequence within the left lateral medulla as above, suspicious for possible small acute ischemic infarct. Correlation with symptomatology for possible acute ischemic infarct at this location recommended. 2. Remote lacunar infarcts involving the bilateral thalami. 3. Age-related cerebral atrophy.  MRA HEAD IMPRESSION:  1. 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 There is some flow within the distal left vertebral artery prior to vertebrobasilar junction via collateral flow from the right vertebral artery system. 2. Short-segment severe stenosis within the mid left P2 segment. 3. Mild multi focal atheromatous irregularity within the cavernous ICAs bilaterally with associated mild multi focal narrowing. 4. Mild distal branch atheromatous irregularity within the MCA and PCA branches bilaterally.   Electronically Signed   By: Jeannine Boga M.D.   On: 07/02/2014 07:35   Mr Jodene Nam Head/brain Wo Cm  07/02/2014   CLINICAL DATA:  Initial evaluation for acute onset of slurred speech, right-sided motor/ sensory symptoms.  EXAM: MRI HEAD WITHOUT CONTRAST  MRA HEAD  WITHOUT CONTRAST  TECHNIQUE: Multiplanar, multiecho pulse sequences of the brain and surrounding structures were obtained without intravenous contrast. Angiographic images of the head were obtained using MRA technique without contrast.  COMPARISON:  Prior CT from 07/01/2014.  FINDINGS: MRI HEAD FINDINGS  Diffuse prominence of the CSF containing spaces is compatible with generalized cerebral atrophy. Very mild chronic small vessel ischemic type changes present within the periventricular white matter. There are small remote lacunar infarcts involving the bilateral thalami.  On DWI sequence, there is apparent 11 mm hyperintense signal intensity seen within the left aspect of the lower medulla (series 4, image 9 on axial DWI sequence, series 7, image 17, on coronal DWI sequence). There is question of subtly increased signal intensity within this region on T2/FLAIR sequences. Finding suspicious for a small acute ischemic infarct, although possible artifact could also be considered.  No other abnormal foci of restricted diffusion to suggest acute intracranial infarct identified. Gray-white matter differentiation otherwise maintained.  No acute or chronic intracranial hemorrhage.  No mass lesion or midline shift. No hydrocephalus. No extra-axial fluid collection.  Craniocervical junction within normal limits. Pituitary gland normal. No acute abnormality  about the orbits. Small retention cyst present within the left maxillary sinus. There is scattered opacity within the anterior ethmoidal air cells, left greater than right. No air-fluid level to suggest active sinus infection. No mastoid effusion. Inner ear structures normal.  Bone marrow signal intensity within normal limits. Scalp soft tissues unremarkable.  MRA HEAD FINDINGS  ANTERIOR CIRCULATION:  Visualized portions of the distal cervical segments of the internal carotid arteries are widely patent with antegrade flow. The petrous segments are widely patent. Multi focal  atheromatous irregularity present within the cavernous segments of the internal carotid arteries bilaterally with associated mild multi focal stenoses. The supraclinoid segments are widely patent.  Right A1 segment is hypoplastic but patent. Left A1 segment widely patent. Anterior communicating artery and anterior cerebral arteries well opacified.  M1 segments widely patent without stenosis or occlusion. Right MCA bifurcates. Left MCA trifurcates. Distal MCA branches fairly symmetric and well opacified bilaterally. Distal MCA branch atheromatous irregularity present.  POSTERIOR CIRCULATION:  Mild atheromatous irregularity present within the proximal right vertebral artery as it courses into the cranial vault. The right vertebral artery is otherwise well opacified to the vertebrobasilar junction. The left vertebral artery is not visualize, occluded in the neck. There is some flow within the distal left vertebral artery just prior to the vertebrobasilar junction likely via flow from the contralateral right vertebral artery system. Posterior inferior cerebral arteries are patent. Anterior inferior cerebellar arteries opacified proximally. Minimal atheromatous irregularity present within the basilar artery without stenosis or occlusion. Superior cerebellar arteries patent bilaterally. There is a short segment severe stenosis within the mid left P2 segment (series 505, image 5). Left posterior cerebral artery otherwise widely patent. Right P1 segment is hypoplastic with a prominent right posterior communicating artery. Right P2 segment well opacified. Distal branch irregularity present within the PCA arteries bilaterally.  No aneurysm or vascular malformation.  IMPRESSION: MRI HEAD IMPRESSION:  1. Linear 11 mm hyperintense focus on DWI sequence within the left lateral medulla as above, suspicious for possible small acute ischemic infarct. Correlation with symptomatology for possible acute ischemic infarct at this  location recommended. 2. Remote lacunar infarcts involving the bilateral thalami. 3. Age-related cerebral atrophy.  MRA HEAD IMPRESSION:  1. 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 There is some flow within the distal left vertebral artery prior to vertebrobasilar junction via collateral flow from the right vertebral artery system. 2. Short-segment severe stenosis within the mid left P2 segment. 3. Mild multi focal atheromatous irregularity within the cavernous ICAs bilaterally with associated mild multi focal narrowing. 4. Mild distal branch atheromatous irregularity within the MCA and PCA branches bilaterally.   Electronically Signed   By: Jeannine Boga M.D.   On: 07/02/2014 07:35     CBC  Recent Labs Lab 07/01/14 2251 07/01/14 2257  WBC 11.5*  --   HGB 15.1 16.0  HCT 44.2 47.0  PLT 273  --   MCV 94.6  --   MCH 32.3  --   MCHC 34.2  --   RDW 12.7  --   LYMPHSABS 2.0  --   MONOABS 0.6  --   EOSABS 0.0  --   BASOSABS 0.0  --     Chemistries   Recent Labs Lab 07/01/14 2251 07/01/14 2257  NA 139 140  K 4.1 4.0  CL 102 100  CO2 27  --   GLUCOSE 141* 146*  BUN 23 31*  CREATININE 0.89 0.80  CALCIUM 8.8  --   AST 29  --  ALT 31  --   ALKPHOS 66  --   BILITOT 0.9  --    ------------------------------------------------------------------------------------------------------------------ estimated creatinine clearance is 84.9 mL/min (by C-G formula based on Cr of 0.8). ------------------------------------------------------------------------------------------------------------------ No results for input(s): HGBA1C in the last 72 hours. ------------------------------------------------------------------------------------------------------------------ No results for input(s): CHOL, HDL, LDLCALC, TRIG, CHOLHDL, LDLDIRECT in the last 72 hours. ------------------------------------------------------------------------------------------------------------------ No results for  input(s): TSH, T4TOTAL, T3FREE, THYROIDAB in the last 72 hours.  Invalid input(s): FREET3 ------------------------------------------------------------------------------------------------------------------ No results for input(s): VITAMINB12, FOLATE, FERRITIN, TIBC, IRON, RETICCTPCT in the last 72 hours.  Coagulation profile  Recent Labs Lab 07/01/14 2251  INR 0.97    No results for input(s): DDIMER in the last 72 hours.  Cardiac Enzymes No results for input(s): CKMB, TROPONINI, MYOGLOBIN in the last 168 hours.  Invalid input(s): CK ------------------------------------------------------------------------------------------------------------------ Invalid input(s): POCBNP     Time Spent in minutes  35   Nakema Fake K M.D on 07/02/2014 at 9:48 AM  Between 7am to 7pm - Pager - [phone]  After 7pm go to [email or web address] - password [facility]  [facility]   Office  [phone]

## 2014-07-02 NOTE — Evaluation (Signed)
Speech Language Pathology Evaluation Patient Details Name: Daniel Quinn MRN: [id number] DOB: 01-03-1946 Today's Date: 07/02/2014 Time: 1443-1540 SLP Time Calculation (min) (ACUTE ONLY): 45 min  Problem List:  Patient Active Problem List   Diagnosis Date Noted  . Right hemiplegia 07/02/2014  . TIA (transient ischemic attack) 07/02/2014  . CVA (cerebral infarction) 07/02/2014  . Dysphagia    Past Medical History:  Past Medical History  Diagnosis Date  . Thyroid disease   . Depression    Past Surgical History:  Past Surgical History  Procedure Laterality Date  . Nose surgery     HPI:  Patient with no prior hx of CVA, presents with sudden onset at 2 PM on 07/02/14 of Right sided numbness and weakness, light-headedness, he stated he is unable to swallow. Reports slurred speech, reports being off balance and blurred vision, he reported diminished sensation along the right side of his body when he got in the shower he could not feel the hot water. . Patient arrived to ED at 10 PM and at this point he was out of window for any thrombolytic intervention; MRI revealed on 07/02/14, a Linear 11 mm hyperintense focus on DWI sequence within the left lateral medulla, suspicious for possible small acute ischemic infarct.    Assessment / Plan / Recommendation Clinical Impression   Pt exhibited speech/language skills and cognitive functioning that were within functional limits for tasks assessed including auditory comprehension, verbal expression and cognition.  Pt stated his only difficulty from CVA was swallowing; No concerns from nursing re: speech/language, but only swallowing was main concern.     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 tasks assessed Arousal/Alertness: Awake/alert Orientation Level: Oriented X4 Memory: Appears intact Awareness: Appears intact Problem Solving: Appears intact Safety/Judgment: Appears intact    Comprehension  Auditory Comprehension Overall Auditory Comprehension: Appears within functional limits for tasks assessed Yes/No Questions: Within Functional Limits Commands: Within Functional Limits Conversation: Complex Visual Recognition/Discrimination Discrimination: Within Function Limits Reading Comprehension Reading Status: Within funtional limits    Expression Expression Primary Mode of Expression: Verbal Verbal Expression Overall Verbal Expression: Appears within functional limits for tasks assessed Initiation: No impairment Level of Generative/Spontaneous Verbalization: Conversation Repetition: No impairment Naming: No impairment Pragmatics: No impairment Non-Verbal Means of Communication: Not applicable Written Expression Written Expression: Not tested   Oral / Motor Oral Motor/Sensory Function Overall Oral Motor/Sensory Function: Impaired Labial ROM: Reduced right Labial Symmetry: Abnormal symmetry right Labial Strength: Reduced Labial Sensation: Reduced Lingual ROM: Reduced right Lingual Symmetry: Abnormal symmetry right;Other (Comment) (minimal) Lingual Strength: Reduced Lingual Sensation: Reduced Facial ROM: Reduced right Facial Strength: Reduced Facial Sensation: Reduced Velum: Within Functional Limits Mandible: Impaired Motor Speech Overall Motor Speech: Appears within functional limits for tasks assessed Respiration: Within functional limits Phonation: Hoarse Resonance:  Within functional limits Articulation: Within functional limitis Intelligibility: Intelligible Motor Planning: Witnin functional limits Motor Speech Errors: Not applicable        ADAMS,PAT, M.S., CCC-SLP 07/02/2014, 1:33 PM

## 2014-07-02 NOTE — Progress Notes (Signed)
Pt arrived to 4N01 alert and oriented X4. Oriented to room and unit. Will continue to monitor. Bobbye Charleston, RN

## 2014-07-02 NOTE — ED Notes (Signed)
Alcohol level is <10 as reported by the lab versus being less than 5 as earlier reported.

## 2014-07-03 ENCOUNTER — Encounter (HOSPITAL_COMMUNITY): Payer: Self-pay | Admitting: *Deleted

## 2014-07-03 ENCOUNTER — Inpatient Hospital Stay (HOSPITAL_COMMUNITY): Payer: Medicare Other

## 2014-07-03 DIAGNOSIS — I63012 Cerebral infarction due to thrombosis of left vertebral artery: Secondary | ICD-10-CM

## 2014-07-03 DIAGNOSIS — I6789 Other cerebrovascular disease: Secondary | ICD-10-CM

## 2014-07-03 DIAGNOSIS — I6509 Occlusion and stenosis of unspecified vertebral artery: Secondary | ICD-10-CM | POA: Diagnosis present

## 2014-07-03 DIAGNOSIS — E785 Hyperlipidemia, unspecified: Secondary | ICD-10-CM

## 2014-07-03 DIAGNOSIS — I6502 Occlusion and stenosis of left vertebral artery: Secondary | ICD-10-CM

## 2014-07-03 LAB — BASIC METABOLIC PANEL
ANION GAP: 3 — AB (ref 5–15)
BUN: 18 mg/dL (ref 6–23)
CALCIUM: 8.4 mg/dL (ref 8.4–10.5)
CO2: 30 mmol/L (ref 19–32)
Chloride: 102 mmol/L (ref 96–112)
Creatinine, Ser: 0.9 mg/dL (ref 0.50–1.35)
GFR, EST NON AFRICAN AMERICAN: 87 mL/min — AB (ref >90)
Glucose, Bld: 92 mg/dL (ref 70–99)
Potassium: 3.8 mmol/L (ref 3.5–5.1)
Sodium: 135 mmol/L (ref 135–145)

## 2014-07-03 LAB — HEMOGLOBIN A1C
HEMOGLOBIN A1C: 6 % — AB (ref 4.8–5.6)
MEAN PLASMA GLUCOSE: 126 mg/dL

## 2014-07-03 MED ORDER — RESOURCE THICKENUP CLEAR PO POWD
ORAL | Status: DC | PRN
  Filled 2014-07-03 (×2): qty 125

## 2014-07-03 MED ORDER — ATORVASTATIN CALCIUM 40 MG PO TABS
40.0000 mg | ORAL_TABLET | Freq: Every day | ORAL | Status: DC
  Administered 2014-07-03: 40 mg via ORAL
  Filled 2014-07-03: qty 1

## 2014-07-03 MED ORDER — CETYLPYRIDINIUM CHLORIDE 0.05 % MT LIQD: 7.0000 mL | Freq: Two times a day (BID) | OROMUCOSAL | Status: DC

## 2014-07-03 MED ORDER — ASPIRIN EC 81 MG PO TBEC
81.0000 mg | DELAYED_RELEASE_TABLET | Freq: Every day | ORAL | Status: DC
  Administered 2014-07-04: 81 mg via ORAL
  Filled 2014-07-03: qty 1

## 2014-07-03 MED ORDER — CHLORHEXIDINE GLUCONATE 0.12 % MT SOLN
15.0000 mL | Freq: Two times a day (BID) | OROMUCOSAL | Status: DC
  Administered 2014-07-03 – 2014-07-04 (×2): 15 mL via OROMUCOSAL
  Filled 2014-07-03 (×2): qty 15

## 2014-07-03 MED ORDER — CLOPIDOGREL BISULFATE 75 MG PO TABS
75.0000 mg | ORAL_TABLET | Freq: Every day | ORAL | Status: DC
  Administered 2014-07-03 – 2014-07-04 (×2): 75 mg via ORAL
  Filled 2014-07-03 (×2): qty 1

## 2014-07-03 MED ORDER — PNEUMOCOCCAL VAC POLYVALENT 25 MCG/0.5ML IJ INJ
0.5000 mL | INJECTION | INTRAMUSCULAR | Status: AC
  Administered 2014-07-04: 0.5 mL via INTRAMUSCULAR
  Filled 2014-07-03: qty 0.5

## 2014-07-03 MED ORDER — IOHEXOL 350 MG/ML SOLN
80.0000 mL | Freq: Once | INTRAVENOUS | Status: AC | PRN
  Administered 2014-07-03: 80 mL via INTRAVENOUS

## 2014-07-03 MED ORDER — SODIUM CHLORIDE 0.9 % IV SOLN
INTRAVENOUS | Status: AC
  Administered 2014-07-03: 12:00:00 via INTRAVENOUS

## 2014-07-03 NOTE — Progress Notes (Signed)
  Echocardiogram 2D Echocardiogram has been performed.  Daniel Quinn 07/03/2014, 1:15 PM

## 2014-07-03 NOTE — Progress Notes (Signed)
Speech Language Pathology  Patient Details Name: Daniel Quinn MRN: [id number] DOB: October 11, 1945 Today's Date: 07/03/2014 Time:  -       MBS scheduled today at 10:00     Orbie Pyo [place].Ed [employer] [phone]

## 2014-07-03 NOTE — Progress Notes (Signed)
STROKE TEAM PROGRESS NOTE   HISTORY Daniel Quinn is an 69 y.o. male with history of thyroid disease and depression presenting with acute onset of slurred speech and right sided motor and sensory deficits. Symptoms began around 1400 07/01/2014 ([facility]) but did not come to ED until 2230 when brought by his son. His current main concern is difficulty swallowing. Denies any history of CVA or TIA. He does not take a daily antiplatelet. Patient was not administered TPA secondary to delay in arrival. He was admitted for further evaluation and treatment.   SUBJECTIVE (INTERVAL HISTORY) No one is at the bedside.  Overall he feels his condition is rapidly improving. His right side weakness and numbness resolved, however, he still has some difficulty with swallowing and he still has some hoarseness.    OBJECTIVE Temp:  [97.9 F (36.6 C)-99 F (37.2 C)] 98 F (36.7 C) (04/11 0613) Pulse Rate:  [54-59] 56 (04/11 0613) Cardiac Rhythm:  [-] Normal sinus rhythm (04/10 2000) Resp:  [16-20] 16 (04/11 0613) BP: (134-183)/(62-86) 134/73 mmHg (04/11 0613) SpO2:  [92 %-99 %] 95 % (04/11 0613)   Recent Labs Lab 07/01/14 2251  GLUCAP 139*    Recent Labs Lab 07/01/14 2251 07/01/14 2257 07/03/14 0703  NA 139 140 135  K 4.1 4.0 3.8  CL 102 100 102  CO2 27  --  30  GLUCOSE 141* 146* 92  BUN 23 31* 18  CREATININE 0.89 0.80 0.90  CALCIUM 8.8  --  8.4    Recent Labs Lab 07/01/14 2251  AST 29  ALT 31  ALKPHOS 66  BILITOT 0.9  PROT 7.0  ALBUMIN 3.8    Recent Labs Lab 07/01/14 2251 07/01/14 2257  WBC 11.5*  --   NEUTROABS 8.9*  --   HGB 15.1 16.0  HCT 44.2 47.0  MCV 94.6  --   PLT 273  --    No results for input(s): CKTOTAL, CKMB, CKMBINDEX, TROPONINI in the last 168 hours.  Recent Labs  07/01/14 2251  LABPROT 13.0  INR 0.97    Recent Labs  07/01/14 2301  COLORURINE YELLOW  LABSPEC 1.027  PHURINE 7.0  GLUCOSEU NEGATIVE  HGBUR NEGATIVE  BILIRUBINUR NEGATIVE  KETONESUR  NEGATIVE  PROTEINUR NEGATIVE  UROBILINOGEN 0.2  NITRITE NEGATIVE  LEUKOCYTESUR NEGATIVE       Component Value Date/Time   CHOL 210* 07/02/2014 0658   TRIG 160* 07/02/2014 0658   HDL 44 07/02/2014 0658   CHOLHDL 4.8 07/02/2014 0658   VLDL 32 07/02/2014 0658   LDLCALC 134* 07/02/2014 0658   Lab Results  Component Value Date   HGBA1C 6.0* 07/02/2014      Component Value Date/Time   LABOPIA NONE DETECTED 07/01/2014 2301   COCAINSCRNUR NONE DETECTED 07/01/2014 2301   LABBENZ NONE DETECTED 07/01/2014 2301   AMPHETMU NONE DETECTED 07/01/2014 2301   THCU NONE DETECTED 07/01/2014 2301   LABBARB NONE DETECTED 07/01/2014 2301     Recent Labs Lab 07/01/14 2251  ETH <5   I have personally reviewed the radiological images below and agree with the radiology interpretations.  Dg Chest 2 View 07/02/2014   No active cardiopulmonary disease.     Ct Head Wo Contrast 07/01/2014   No acute intracranial abnormalities. Mild chronic atrophy and small vessel ischemic changes.     MRI HEAD  07/02/2014    1. Linear 11 mm hyperintense focus on DWI sequence within the left lateral medulla as above, suspicious for possible small acute ischemic infarct. Correlation with  symptomatology for possible acute ischemic infarct at this location recommended. 2. Remote lacunar infarcts involving the bilateral thalami. 3. Age-related cerebral atrophy.    MRA HEAD  07/02/2014    1. 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 There is some flow within the distal left vertebral artery prior to vertebrobasilar junction via collateral flow from the right vertebral artery system. 2. Short-segment severe stenosis within the mid left P2 segment. 3. Mild multi focal atheromatous irregularity within the cavernous ICAs bilaterally with associated mild multi focal narrowing. 4. Mild distal branch atheromatous irregularity within the MCA and PCA branches bilaterally.     CTA neck 07/03/2014 1. Gradual occlusion of the distal  left vertebral artery in the upper neck to the C1 level. This more resembles sequelae of atherosclerotic disease than a focal dissection. There is also high-grade stenosis at the left vertebral artery origin due to soft plaque. 2. Mild right vertebral artery origin stenosis. Bilateral carotid bifurcation plaque without hemodynamically significant cervical carotid stenosis. 3. 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 No neck mass or lymphadenopathy identified.  2D echo - Left ventricle: The cavity size was normal. Systolic function was normal. The estimated ejection fraction was in the range of 55% to 60%. Wall motion was normal; there were no regional wall motion abnormalities. Left ventricular diastolic function parameters were normal. - Left atrium: The atrium was mildly dilated. - Atrial septum: No defect or patent foramen ovale was identified.   PHYSICAL EXAM  Temp:  [97.9 F (36.6 C)-99.1 F (37.3 C)] 99.1 F (37.3 C) (04/11 1735) Pulse Rate:  [55-74] 74 (04/11 1735) Resp:  [16-18] 18 (04/11 1735) BP: (128-146)/(62-73) 128/70 mmHg (04/11 1735) SpO2:  [94 %-96 %] 96 % (04/11 1735)  General - Well nourished, well developed, in no apparent distress.  Ophthalmologic - Sharp disc margins OU.  Cardiovascular - Regular rate and rhythm with no murmur.  Mental Status -  Level of arousal and orientation to time, place, and person were intact. Language including expression, naming, repetition, comprehension was assessed and found intact. Fund of Knowledge was assessed and was intact.  Cranial Nerves II - XII - II - Visual field intact OU. III, IV, VI - Extraocular movements intact. V - Facial sensation intact bilaterally. VII - Facial movement intact bilaterally. VIII - Hearing & vestibular intact bilaterally. X - Palate elevates symmetrically, but mild hoarseness, normal gag and cough reflex. XI - Chin turning & shoulder shrug intact bilaterally. XII -  Tongue protrusion intact.  Motor Strength - The patient's strength was normal in all extremities and pronator drift was absent.  Bulk was normal and fasciculations were absent.   Motor Tone - Muscle tone was assessed at the neck and appendages and was normal.  Reflexes - The patient's reflexes were 1+ in all extremities and he had no pathological reflexes.  Sensory - Light touch, temperature/pinprick were assessed and were symmetrical.    Coordination - The patient had normal movements in the hands and feet with no ataxia or dysmetria.  Tremor was absent.  Gait and Station - The patient's transfers, posture, gait, station, and turns were observed as normal.    ASSESSMENT/PLAN [name] is a 69 y.o. male with history of thyroid disease and depression presenting with right sided weakness and slurred speech. He did not receive IV t-PA due to delayin arrival.   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 ST to be MBS today   no antithrombotic prior to admission, now on aspirin 300 mg suppository daily. Given large vessel intracranial atherosclerosis, once patient able to swallow, treat with aspirin 81 mg and clopidogrel 75 mg orally every day x 3 months for secondary stroke prevention. After 3 months, change to plavix alone. Long-term dual antiplatelets are contraindicated due to risk for intracerebral hemorrhage.  Patient counseled to be compliant with his antithrombotic medications  Ongoing aggressive stroke risk factor management  Therapy  recommendations:  No PT, OP OT & ST  Disposition:  Home with OP therapies  Dysphagia  Difficulty with swallowing presist  Passed swallow test but on dysphagia 3 diet  Avoid aspiration  Speech to follow  Hyperlipidemia  Home meds:  No statin  LDL 134, goal < 70  Add lipitor 40  Continue statin at discharge  Other Stroke Risk Factors  Advanced age  ETOH use  Family hx stroke (mother)  Other Active Problems  Hypothyroid   Mild anemia  Mildly elevated BUN  Hospital day # 1   Neurology will sign off. Please call with questions. Pt will follow up with Dr. Erlinda Hong at [facility] in about 2 months. Thanks for the consult.   Rosalin Hawking, MD PhD Stroke Neurology 07/03/2014 8:15 PM   To contact Stroke Continuity provider, please refer to [email or web address] After hours, contact General Neurology

## 2014-07-03 NOTE — Progress Notes (Signed)
Speech Language Pathology  Patient Details Name: Daniel Quinn MRN: [id number] DOB: 1945-04-11 Today's Date: 07/03/2014 Time: 4098-1191     MBS complete. Please to to chart review, imaging section and click on swallow function for results and recommendations.    Orbie Pyo Woden.Ed [employer] [phone]

## 2014-07-03 NOTE — Evaluation (Signed)
Occupational Therapy Evaluation/ DC Patient Details Name: Daniel Quinn MRN: [id number] DOB: [birth year] Today's Date: 07/03/2014    History of Present Illness 69 yo male with MRI revealed on 07/02/14, a Linear 11 mm hyperintense focus on DWI sequence within the left lateral medulla, suspicious for possible small acute ischemic infarct.    Clinical Impression   Pt is near baseline for ADLs and IADLs at this time. Pt self reports balance deficits on the R side but able to complete all balance assessments without any deficits noted. Pt reports "i just feel little off". Ot recommending outpatient for follow up and signing off acutely. Daughter will stay with patient immediately upon d/c home and son/daughter will complete all grocery shopping. Pt requesting permission to drive and advised to await MD release to drive. MD please address topic with patient. NO further acute needs at this time.     Follow Up Recommendations  Outpatient OT (balance assessment)    Equipment Recommendations  None recommended by OT    Recommendations for Other Services       Precautions / Restrictions Precautions Precautions: Fall      Mobility Bed Mobility Overal bed mobility: Independent                Transfers Overall transfer level: Independent                    Balance                                 Standardized Balance Assessment Standardized Balance Assessment : Berg Balance Test;Dynamic Gait Index Berg Balance Test Sit to Stand: Able to stand without using hands and stabilize independently Standing Unsupported: Able to stand safely 2 minutes Sitting with Back Unsupported but Feet Supported on Floor or Stool: Able to sit safely and securely 2 minutes Stand to Sit: Sits safely with minimal use of hands Transfers: Able to transfer safely, minor use of hands Standing Unsupported with Eyes Closed: Able to stand 10 seconds safely From Standing Position, Pick  up Object from Floor: Able to pick up shoe safely and easily Turn 360 Degrees: Able to turn 360 degrees safely in 4 seconds or less Dynamic Gait Index Level Surface: Normal Change in Gait Speed: Normal Gait with Horizontal Head Turns: Normal Gait with Vertical Head Turns: Normal Gait and Pivot Turn: Normal Step Over Obstacle: Normal Step Around Obstacles: Normal      ADL Overall ADL's : Modified independent                                       General ADL Comments: PT completed toilet transfer, shower simulation, bed transfer, chair transfer and sink level grooming. pt completed balance assessment below     Vision Vision Assessment?: No apparent visual deficits   Perception     Praxis      Pertinent Vitals/Pain Pain Assessment: No/denies pain     Hand Dominance Right   Extremity/Trunk Assessment Upper Extremity Assessment Upper Extremity Assessment: Overall WFL for tasks assessed   Lower Extremity Assessment Lower Extremity Assessment: Defer to PT evaluation   Cervical / Trunk Assessment Cervical / Trunk Assessment: Normal   Communication Communication Communication: No difficulties   Cognition Arousal/Alertness: Awake/alert Behavior During Therapy: WFL for tasks assessed/performed Overall Cognitive Status: Within Functional Limits for tasks assessed  General Comments       Exercises       Shoulder Instructions      Home Living Family/patient expects to be discharged to:: Private residence Living Arrangements: Alone Available Help at Discharge: Family Type of Home: House Home Access: Level entry     Home Layout: One level     Bathroom Shower/Tub: Occupational psychologist: Handicapped height     [place]: Grab bars - toilet;Grab bars - tub/shower;Hand held shower head;Shower seat - built in      Lives With: Alone    Prior Functioning/Environment Level of Independence: Independent              OT Diagnosis:     OT Problem List:     OT Treatment/Interventions:      OT Goals(Current goals can be found in the care plan section) Acute Rehab OT Goals Patient Stated Goal: to not bother children if he doesnt have to. Return to driving  OT Frequency:     Barriers to D/C:            Co-evaluation              End of Session Nurse Communication: Mobility status  Activity Tolerance: Patient tolerated treatment well Patient left: in chair;with call bell/phone within reach   Time: 0831-0851 OT Time Calculation (min): 20 min Charges:  OT General Charges $OT Visit: 1 Procedure OT Evaluation $Initial OT Evaluation Tier I: 1 Procedure G-Codes:    Parke Poisson B July 20, 2014, 9:16 AM  Pager: [phone]

## 2014-07-03 NOTE — Progress Notes (Signed)
Speech Language Pathology Treatment: Dysphagia  Patient Details Name: Daniel Quinn MRN: [id number] DOB: Jul 07, 1945 Today's Date: 07/03/2014 Time: 7001-7494 SLP Time Calculation (min) (ACUTE ONLY): 16 min  Assessment / Plan / Recommendation Clinical Impression  Pt notified of his MBS scheduled today at 10:00 in which he expressed concern about difficulty initiating swallows of this saliva (expectorating mucous in cup). He reports ability to initiate dry swallows is much better than yesterday. SLP provided nectar thick in which effortful swallow, prolonged laryngeal elevation and delayed throat clear/cough present. Min verbal cues provided for additional swallows. Will proceed with MBS at 10:00.   HPI HPI: Patient with no prior hx of CVA, presents with sudden onset at 2 PM on 07/02/14 of Right sided numbness and weakness, light-headedness, he stated he is unable to swallow. Reports slurred speech, reports being off balance and blurred vision, he reported diminished sensation along the right side of his body when he got in the shower he could not feel the hot water. . Patient arrived to ED at 10 PM and at this point he was out of window for any thrombolytic intervention   Pertinent Vitals Pain Assessment: No/denies pain  SLP Plan  MBS    Recommendations Diet recommendations: NPO              Oral Care Recommendations: Oral care BID Follow up Recommendations:  (TBd) Plan: MBS    GO     Houston Siren 07/03/2014, 9:12 AM  Orbie Pyo Colvin Caroli.Ed [employer] [phone]

## 2014-07-03 NOTE — Progress Notes (Signed)
Patient Demographics  Daniel Quinn, is a 69 y.o. male, DOB - Sep 15, 1945, MEQ:[id number]  Admit date - 07/01/2014   Admitting Physician Toy Baker, MD  Outpatient Primary MD for the patient is No primary care provider on file.  LOS - 1   Chief Complaint  Patient presents with  . Numbness  . Dysphagia        Subjective:   Daniel Quinn today has, No headache, No chest pain, No abdominal pain - No Nausea, No new weakness tingling or numbness, No Cough - SOB. Weakness is resolved but still having some dysphagia but mildly improved.  Assessment & Plan    1. 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 Does have ongoing dysphagia and mild right facial droop, right-sided weakness has resolved. Full stroke workup underway, monitor on tele, obtain PT, OT, speech input and lipid panel, pending Echogram, CTA & A1c . On aspirin rectally for now. Further management per neurology. ? Neuro IR.   Needs aspirin and Plavix along with statin once taking food and medications orally.    No results found for: HGBA1C  Lab Results  Component Value Date   CHOL 210* 07/02/2014   HDL 44 07/02/2014   LDLCALC 134* 07/02/2014   TRIG 160* 07/02/2014   CHOLHDL 4.8 07/02/2014     2. Hypothyroidism. For now on IV Synthroid.   3. GERD. On PPI.    Code Status: Full  Family Communication: None  Disposition Plan: To be decided   Procedures   CT head, MRI/MRA brain.  Echogram, CTA   Consults Neuro   Medications  Scheduled Meds: . aspirin  300 mg Rectal Daily   Or  . aspirin  325 mg Oral Daily  . heparin subcutaneous  5,000 Units Subcutaneous 3 times per day  . levothyroxine  50 mcg Intravenous Daily  . pantoprazole (PROTONIX) IV  40 mg Intravenous QHS    Continuous Infusions: . sodium chloride     PRN Meds:.acetaminophen **OR** acetaminophen  DVT Prophylaxis    Heparin    Lab Results  Component Value Date   PLT 273 07/01/2014    Antibiotics     Anti-infectives    None          Objective:   Filed Vitals:   07/02/14 2120 07/03/14 0143 07/03/14 0613 07/03/14 0919  BP: 146/66 137/62 134/73 139/73  Pulse: 59 55 56 69  Temp: 98.2 F (36.8 C) 97.9 F (36.6 C) 98 F (36.7 C) 98.6 F (37 C)  TempSrc: Oral Oral Oral Oral  Resp: 18 18 16 16   Height:      Weight:      SpO2: 94% 95% 95% 96%    Wt Readings from Last 3 Encounters:  07/02/14 75.841 kg (167 lb 3.2 oz)     Intake/Output Summary (Last 24 hours) at 07/03/14 1020 Last data filed at 07/02/14 1756  Gross per 24 hour  Intake      0 ml  Output      0 ml  Net      0 ml     Physical Exam  Awake Alert, Oriented X 3, No new F.N deficits, Normal affect, speech and R. weakness better, some improvement in dysphagia  [place].AT,PERRAL Supple Neck,No  JVD, No cervical lymphadenopathy appriciated.  Symmetrical Chest wall movement, Good air movement bilaterally, CTAB RRR,No Gallops,Rubs or new Murmurs, No Parasternal Heave +ve B.Sounds, Abd Soft, No tenderness, No organomegaly appriciated, No rebound - guarding or rigidity. No Cyanosis, Clubbing or edema, No new Rash or bruise      Data Review   Micro Results No results found for this or any previous visit (from the past 240 hour(s)).  Radiology Reports Dg Chest 2 View  07/02/2014   CLINICAL DATA:  Cough, dysphagia starting last night, question CVA  EXAM: CHEST  2 VIEW  COMPARISON:  None.  FINDINGS: Cardiomediastinal silhouette is unremarkable. No acute infiltrate or pleural effusion. No pulmonary edema. Bony thorax is unremarkable.  IMPRESSION: No active cardiopulmonary disease.   Electronically Signed   By: Lahoma Crocker M.D.   On: 07/02/2014 09:44   Ct Head Wo Contrast  07/01/2014   CLINICAL DATA:  Patient  presents for right-sided numbness, difficulty swallowing, bilateral blurry anus, and slurred speech. Onset 1400 hours.  EXAM: CT HEAD WITHOUT CONTRAST  TECHNIQUE: Contiguous axial images were obtained from the base of the skull through the vertex without intravenous contrast.  COMPARISON:  None.  FINDINGS: Diffuse cerebral atrophy. Mild ventricular dilatation consistent with central atrophy. Patchy low-attenuation changes in the deep white matter consistent with small vessel ischemia. Vascular calcifications. No mass effect or midline shift. No abnormal extra-axial fluid collections. Gray-white matter junctions are distinct. Basal cisterns are not effaced. No evidence of acute intracranial hemorrhage. No depressed skull fractures. Mucosal thickening in the paranasal sinuses with retention cyst in the left maxillary antrum and opacification of some of the ethmoid air cells. Mastoid air cells are not opacified.  IMPRESSION: No acute intracranial abnormalities. Mild chronic atrophy and small vessel ischemic changes.   Electronically Signed   By: Lucienne Capers M.D.   On: 07/01/2014 23:50   Mr Brain Wo Contrast  07/02/2014   CLINICAL DATA:  Initial evaluation for acute onset of slurred speech, right-sided motor/ sensory symptoms.  EXAM: MRI HEAD WITHOUT CONTRAST  MRA HEAD WITHOUT CONTRAST  TECHNIQUE: Multiplanar, multiecho pulse sequences of the brain and surrounding structures were obtained without intravenous contrast. Angiographic images of the head were obtained using MRA technique without contrast.  COMPARISON:  Prior CT from 07/01/2014.  FINDINGS: MRI HEAD FINDINGS  Diffuse prominence of the CSF containing spaces is compatible with generalized cerebral atrophy. Very mild chronic small vessel ischemic type changes present within the periventricular white matter. There are small remote lacunar infarcts involving the bilateral thalami.  On DWI sequence, there is apparent 11 mm hyperintense signal intensity seen  within the left aspect of the lower medulla (series 4, image 9 on axial DWI sequence, series 7, image 17, on coronal DWI sequence). There is question of subtly increased signal intensity within this region on T2/FLAIR sequences. Finding suspicious for a small acute ischemic infarct, although possible artifact could also be considered.  No other abnormal foci of restricted diffusion to suggest acute intracranial infarct identified. Gray-white matter differentiation otherwise maintained.  No acute or chronic intracranial hemorrhage.  No mass lesion or midline shift. No hydrocephalus. No extra-axial fluid collection.  Craniocervical junction within normal limits. Pituitary gland normal. No acute abnormality about the orbits. Small retention cyst present within the left maxillary sinus. There is scattered opacity within the anterior ethmoidal air cells, left greater than right. No air-fluid level to suggest active sinus infection. No mastoid effusion. Inner ear structures normal.  Bone marrow signal intensity  within normal limits. Scalp soft tissues unremarkable.  MRA HEAD FINDINGS  ANTERIOR CIRCULATION:  Visualized portions of the distal cervical segments of the internal carotid arteries are widely patent with antegrade flow. The petrous segments are widely patent. Multi focal atheromatous irregularity present within the cavernous segments of the internal carotid arteries bilaterally with associated mild multi focal stenoses. The supraclinoid segments are widely patent.  Right A1 segment is hypoplastic but patent. Left A1 segment widely patent. Anterior communicating artery and anterior cerebral arteries well opacified.  M1 segments widely patent without stenosis or occlusion. Right MCA bifurcates. Left MCA trifurcates. Distal MCA branches fairly symmetric and well opacified bilaterally. Distal MCA branch atheromatous irregularity present.  POSTERIOR CIRCULATION:  Mild atheromatous irregularity present within the  proximal right vertebral artery as it courses into the cranial vault. The right vertebral artery is otherwise well opacified to the vertebrobasilar junction. The left vertebral artery is not visualize, occluded in the neck. There is some flow within the distal left vertebral artery just prior to the vertebrobasilar junction likely via flow from the contralateral right vertebral artery system. Posterior inferior cerebral arteries are patent. Anterior inferior cerebellar arteries opacified proximally. Minimal atheromatous irregularity present within the basilar artery without stenosis or occlusion. Superior cerebellar arteries patent bilaterally. There is a short segment severe stenosis within the mid left P2 segment (series 505, image 5). Left posterior cerebral artery otherwise widely patent. Right P1 segment is hypoplastic with a prominent right posterior communicating artery. Right P2 segment well opacified. Distal branch irregularity present within the PCA arteries bilaterally.  No aneurysm or vascular malformation.  IMPRESSION: MRI HEAD IMPRESSION:  1. Linear 11 mm hyperintense focus on DWI sequence within the left lateral medulla as above, suspicious for possible small acute ischemic infarct. Correlation with symptomatology for possible acute ischemic infarct at this location recommended. 2. Remote lacunar infarcts involving the bilateral thalami. 3. Age-related cerebral atrophy.  MRA HEAD IMPRESSION:  1. 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 There is some flow within the distal left vertebral artery prior to vertebrobasilar junction via collateral flow from the right vertebral artery system. 2. Short-segment severe stenosis within the mid left P2 segment. 3. Mild multi focal atheromatous irregularity within the cavernous ICAs bilaterally with associated mild multi focal narrowing. 4. Mild distal branch atheromatous irregularity within the MCA and PCA branches bilaterally.   Electronically Signed   By:  Jeannine Boga M.D.   On: 07/02/2014 07:35   Mr Jodene Nam Head/brain Wo Cm  07/02/2014   CLINICAL DATA:  Initial evaluation for acute onset of slurred speech, right-sided motor/ sensory symptoms.  EXAM: MRI HEAD WITHOUT CONTRAST  MRA HEAD WITHOUT CONTRAST  TECHNIQUE: Multiplanar, multiecho pulse sequences of the brain and surrounding structures were obtained without intravenous contrast. Angiographic images of the head were obtained using MRA technique without contrast.  COMPARISON:  Prior CT from 07/01/2014.  FINDINGS: MRI HEAD FINDINGS  Diffuse prominence of the CSF containing spaces is compatible with generalized cerebral atrophy. Very mild chronic small vessel ischemic type changes present within the periventricular white matter. There are small remote lacunar infarcts involving the bilateral thalami.  On DWI sequence, there is apparent 11 mm hyperintense signal intensity seen within the left aspect of the lower medulla (series 4, image 9 on axial DWI sequence, series 7, image 17, on coronal DWI sequence). There is question of subtly increased signal intensity within this region on T2/FLAIR sequences. Finding suspicious for a small acute ischemic infarct, although possible artifact could also  be considered.  No other abnormal foci of restricted diffusion to suggest acute intracranial infarct identified. Gray-white matter differentiation otherwise maintained.  No acute or chronic intracranial hemorrhage.  No mass lesion or midline shift. No hydrocephalus. No extra-axial fluid collection.  Craniocervical junction within normal limits. Pituitary gland normal. No acute abnormality about the orbits. Small retention cyst present within the left maxillary sinus. There is scattered opacity within the anterior ethmoidal air cells, left greater than right. No air-fluid level to suggest active sinus infection. No mastoid effusion. Inner ear structures normal.  Bone marrow signal intensity within normal limits. Scalp  soft tissues unremarkable.  MRA HEAD FINDINGS  ANTERIOR CIRCULATION:  Visualized portions of the distal cervical segments of the internal carotid arteries are widely patent with antegrade flow. The petrous segments are widely patent. Multi focal atheromatous irregularity present within the cavernous segments of the internal carotid arteries bilaterally with associated mild multi focal stenoses. The supraclinoid segments are widely patent.  Right A1 segment is hypoplastic but patent. Left A1 segment widely patent. Anterior communicating artery and anterior cerebral arteries well opacified.  M1 segments widely patent without stenosis or occlusion. Right MCA bifurcates. Left MCA trifurcates. Distal MCA branches fairly symmetric and well opacified bilaterally. Distal MCA branch atheromatous irregularity present.  POSTERIOR CIRCULATION:  Mild atheromatous irregularity present within the proximal right vertebral artery as it courses into the cranial vault. The right vertebral artery is otherwise well opacified to the vertebrobasilar junction. The left vertebral artery is not visualize, occluded in the neck. There is some flow within the distal left vertebral artery just prior to the vertebrobasilar junction likely via flow from the contralateral right vertebral artery system. Posterior inferior cerebral arteries are patent. Anterior inferior cerebellar arteries opacified proximally. Minimal atheromatous irregularity present within the basilar artery without stenosis or occlusion. Superior cerebellar arteries patent bilaterally. There is a short segment severe stenosis within the mid left P2 segment (series 505, image 5). Left posterior cerebral artery otherwise widely patent. Right P1 segment is hypoplastic with a prominent right posterior communicating artery. Right P2 segment well opacified. Distal branch irregularity present within the PCA arteries bilaterally.  No aneurysm or vascular malformation.  IMPRESSION: MRI  HEAD IMPRESSION:  1. Linear 11 mm hyperintense focus on DWI sequence within the left lateral medulla as above, suspicious for possible small acute ischemic infarct. Correlation with symptomatology for possible acute ischemic infarct at this location recommended. 2. Remote lacunar infarcts involving the bilateral thalami. 3. Age-related cerebral atrophy.  MRA HEAD IMPRESSION:  1. 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 There is some flow within the distal left vertebral artery prior to vertebrobasilar junction via collateral flow from the right vertebral artery system. 2. Short-segment severe stenosis within the mid left P2 segment. 3. Mild multi focal atheromatous irregularity within the cavernous ICAs bilaterally with associated mild multi focal narrowing. 4. Mild distal branch atheromatous irregularity within the MCA and PCA branches bilaterally.   Electronically Signed   By: Jeannine Boga M.D.   On: 07/02/2014 07:35     CBC  Recent Labs Lab 07/01/14 2251 07/01/14 2257  WBC 11.5*  --   HGB 15.1 16.0  HCT 44.2 47.0  PLT 273  --   MCV 94.6  --   MCH 32.3  --   MCHC 34.2  --   RDW 12.7  --   LYMPHSABS 2.0  --   MONOABS 0.6  --   EOSABS 0.0  --   BASOSABS 0.0  --  Chemistries   Recent Labs Lab 07/01/14 2251 07/01/14 2257 07/03/14 0703  NA 139 140 135  K 4.1 4.0 3.8  CL 102 100 102  CO2 27  --  30  GLUCOSE 141* 146* 92  BUN 23 31* 18  CREATININE 0.89 0.80 0.90  CALCIUM 8.8  --  8.4  AST 29  --   --   ALT 31  --   --   ALKPHOS 66  --   --   BILITOT 0.9  --   --    ------------------------------------------------------------------------------------------------------------------ estimated creatinine clearance is 75.5 mL/min (by C-G formula based on Cr of 0.9). ------------------------------------------------------------------------------------------------------------------ No results for input(s): HGBA1C in the last 72  hours. ------------------------------------------------------------------------------------------------------------------  Recent Labs  07/02/14 0658  CHOL 210*  HDL 44  LDLCALC 134*  TRIG 160*  CHOLHDL 4.8   ------------------------------------------------------------------------------------------------------------------ No results for input(s): TSH, T4TOTAL, T3FREE, THYROIDAB in the last 72 hours.  Invalid input(s): FREET3 ------------------------------------------------------------------------------------------------------------------ No results for input(s): VITAMINB12, FOLATE, FERRITIN, TIBC, IRON, RETICCTPCT in the last 72 hours.  Coagulation profile  Recent Labs Lab 07/01/14 2251  INR 0.97    No results for input(s): DDIMER in the last 72 hours.  Cardiac Enzymes No results for input(s): CKMB, TROPONINI, MYOGLOBIN in the last 168 hours.  Invalid input(s): CK ------------------------------------------------------------------------------------------------------------------ Invalid input(s): POCBNP     Time Spent in minutes  35   Lala Lund K M.D on 07/03/2014 at 10:20 AM  Between 7am to 7pm - Pager - [phone]  After 7pm go to [email or web address] - password [facility]  [facility]   Office  [phone]

## 2014-07-03 NOTE — Evaluation (Signed)
Physical Therapy Evaluation Patient Details Name: Daniel Quinn MRN: [id number] DOB: [birth year] Today's Date: 07/03/2014   History of Present Illness  69 yo male with MRI revealed on 07/02/14, a Linear 11 mm hyperintense focus on DWI sequence within the left lateral medulla, suspicious for possible small acute ischemic infarct. PMH of depression.    Clinical Impression  Patient presents close to functional baseline with regards to mobility. Tolerated higher level balance challenges and negotiating steps without deviations in gait or LOB. Pt self reports feeling slightly "off" but this is improving with increased mobility. Encourage daily ambulation while in hospital to improve balance/strength. Anticipate balance and strength will improve with increased activity/ambulation. Pt does not require skilled therapy services due to above and will have assist from daughter at home. Discharge from therapy.    Follow Up Recommendations No PT follow up    Equipment Recommendations  None recommended by PT    Recommendations for Other Services Speech consult     Precautions / Restrictions Precautions Precautions: None Restrictions Weight Bearing Restrictions: No      Mobility  Bed Mobility Overal bed mobility: Independent                Transfers Overall transfer level: Independent                  Ambulation/Gait Ambulation/Gait assistance: Independent Ambulation Distance (Feet): 510 Feet Assistive device: None Gait Pattern/deviations: WFL(Within Functional Limits)   Gait velocity interpretation: at or above normal speed for age/gender General Gait Details: Pt with steady gait. Tolerated higher level balance challenges without difficulty. See balance section.  Stairs Stairs: Yes Stairs assistance: Modified independent (Device/Increase time) Stair Management: Two rails;Alternating pattern Number of Stairs: 3 (+ 2 steps x2 bouts) General stair comments: Safe stair  negotiation technique.  Wheelchair Mobility    Modified Rankin (Stroke Patients Only) Modified Rankin (Stroke Patients Only) Pre-Morbid Rankin Score: No symptoms Modified Rankin: No significant disability     Balance Overall balance assessment: Needs assistance Sitting-balance support: Feet supported;No upper extremity supported Sitting balance-Leahy Scale: Good     Standing balance support: During functional activity Standing balance-Leahy Scale: Good               High level balance activites: Backward walking;Direction changes;Turns;Sudden stops;Head turns High Level Balance Comments: Tolerated above balance challenges without gait deviations or LOB. Able to pick up object from floor and step over objects without difficulty.  Standardized Balance Assessment Standardized Balance Assessment : Dynamic Gait Index Berg Balance Test Sit to Stand: Able to stand without using hands and stabilize independently Standing Unsupported: Able to stand safely 2 minutes Sitting with Back Unsupported but Feet Supported on Floor or Stool: Able to sit safely and securely 2 minutes Stand to Sit: Sits safely with minimal use of hands Transfers: Able to transfer safely, minor use of hands Standing Unsupported with Eyes Closed: Able to stand 10 seconds safely From Standing Position, Pick up Object from Floor: Able to pick up shoe safely and easily Turn 360 Degrees: Able to turn 360 degrees safely in 4 seconds or less Dynamic Gait Index Level Surface: Normal Change in Gait Speed: Normal Gait with Horizontal Head Turns: Normal Gait with Vertical Head Turns: Normal Gait and Pivot Turn: Normal Step Over Obstacle: Normal Step Around Obstacles: Normal Steps: Mild Impairment Total Score: 23       Pertinent Vitals/Pain Pain Assessment: No/denies pain    Home Living Family/patient expects to be discharged to:: Private residence Living Arrangements:  Alone Available Help at Discharge:  Family Type of Home: House Home Access: Level entry     Home Layout: One level Home Equipment: Grab bars - toilet;Grab bars - tub/shower;Hand held shower head;Shower seat - built in      Prior Function Level of Independence: Independent               Hand Dominance   Dominant Hand: Right    Extremity/Trunk Assessment   Upper Extremity Assessment: Defer to OT evaluation;Overall WFL for tasks assessed           Lower Extremity Assessment: Overall WFL for tasks assessed      Cervical / Trunk Assessment: Normal  Communication   Communication: No difficulties  Cognition Arousal/Alertness: Awake/alert Behavior During Therapy: WFL for tasks assessed/performed Overall Cognitive Status: Within Functional Limits for tasks assessed                      General Comments      Exercises        Assessment/Plan    PT Assessment Patent does not need any further PT services  PT Diagnosis     PT Problem List    PT Treatment Interventions     PT Goals (Current goals can be found in the Care Plan section) Acute Rehab PT Goals Patient Stated Goal: be able to swallow, eat something PT Goal Formulation: All assessment and education complete, DC therapy Time For Goal Achievement: 07/17/14 Potential to Achieve Goals: Good    Frequency     Barriers to discharge        Co-evaluation               End of Session Equipment Utilized During Treatment: Gait belt Activity Tolerance: Patient tolerated treatment well Patient left: in chair;with call bell/phone within reach;with family/visitor present Nurse Communication: Mobility status         Time: 4944-9675 PT Time Calculation (min) (ACUTE ONLY): 12 min   Charges:   PT Evaluation $Initial PT Evaluation Tier I: 1 Procedure     PT G CodesNeal Dy, Marguarite Arbour A 2014-07-24, 9:47 AM Candy Sledge, PT, DPT [phone]

## 2014-07-04 DIAGNOSIS — I63212 Cerebral infarction due to unspecified occlusion or stenosis of left vertebral arteries: Secondary | ICD-10-CM | POA: Diagnosis not present

## 2014-07-04 MED ORDER — LORATADINE 10 MG PO TABS
10.0000 mg | ORAL_TABLET | Freq: Every day | ORAL | Status: DC
  Administered 2014-07-04: 10 mg via ORAL
  Filled 2014-07-04: qty 1

## 2014-07-04 MED ORDER — CLOPIDOGREL BISULFATE 75 MG PO TABS: 75.0000 mg | ORAL_TABLET | Freq: Every day | ORAL | Status: DC

## 2014-07-04 MED ORDER — ATORVASTATIN CALCIUM 40 MG PO TABS: 40.0000 mg | ORAL_TABLET | Freq: Every day | ORAL | Status: AC

## 2014-07-04 MED ORDER — ASPIRIN 81 MG PO TBEC: 81.0000 mg | DELAYED_RELEASE_TABLET | Freq: Every day | ORAL | Status: AC

## 2014-07-04 MED ORDER — RESOURCE THICKENUP CLEAR PO POWD: ORAL | Status: DC

## 2014-07-04 MED ORDER — LEVOTHYROXINE SODIUM 112 MCG PO TABS
112.0000 ug | ORAL_TABLET | Freq: Every day | ORAL | Status: DC
  Administered 2014-07-04: 112 ug via ORAL
  Filled 2014-07-04: qty 1

## 2014-07-04 MED ORDER — PANTOPRAZOLE SODIUM 40 MG PO TBEC: 40.0000 mg | DELAYED_RELEASE_TABLET | Freq: Every day | ORAL | Status: DC

## 2014-07-04 NOTE — Discharge Instructions (Signed)
Follow with Primary MD  in 7 days   Get CBC, CMP, 2 view Chest X ray checked  by Primary MD next visit.    Activity: As tolerated with Full fall precautions use walker/cane & assistance as needed   Disposition Home     Diet:  Dysphagia 3 diet - Nectar thick liquids with feeding assistance and full Aspiration percautions  For Heart failure patients - Check your Weight same time everyday, if you gain over 2 pounds, or you develop in leg swelling, experience more shortness of breath or chest pain, call your Primary MD immediately. Follow Cardiac Low Salt Diet and 1.5 lit/day fluid restriction.   On your next visit with your primary care physician please Get Medicines reviewed and adjusted.   Please request your Prim.MD to go over all Hospital Tests and Procedure/Radiological results at the follow up, please get all Hospital records sent to your Prim MD by signing hospital release before you go home.   If you experience worsening of your admission symptoms, develop shortness of breath, life threatening emergency, suicidal or homicidal thoughts you must seek medical attention immediately by calling 911 or calling your MD immediately  if symptoms less severe.  You Must read complete instructions/literature along with all the possible adverse reactions/side effects for all the Medicines you take and that have been prescribed to you. Take any new Medicines after you have completely understood and accpet all the possible adverse reactions/side effects.   Do not drive, operating heavy machinery, perform activities at heights, swimming or participation in water activities or provide baby sitting services if your were admitted for syncope or siezures until you have seen by Primary MD or a Neurologist and advised to do so again.  Do not drive when taking Pain medications.    Do not take more than prescribed Pain, Sleep and Anxiety Medications  Special Instructions: If you have smoked or chewed  Tobacco  in the last 2 yrs please stop smoking, stop any regular Alcohol  and or any Recreational drug use.  Wear Seat belts while driving.   Please note  You were cared for by a hospitalist during your hospital stay. If you have any questions about your discharge medications or the care you received while you were in the hospital after you are discharged, you can call the unit and asked to speak with the hospitalist on call if the hospitalist that took care of you is not available. Once you are discharged, your primary care physician will handle any further medical issues. Please note that NO REFILLS for any discharge medications will be authorized once you are discharged, as it is imperative that you return to your primary care physician (or establish a relationship with a primary care physician if you do not have one) for your aftercare needs so that they can reassess your need for medications and monitor your lab values.  STROKE/TIA DISCHARGE INSTRUCTIONS SMOKING Cigarette smoking nearly doubles your risk of having a stroke & is the single most alterable risk factor  If you smoke or have smoked in the last 12 months, you are advised to quit smoking for your health.  Most of the excess cardiovascular risk related to smoking disappears within a year of stopping.  Ask you doctor about anti-smoking medications  [place] Quit Line: 1-800-QUIT NOW  Free Smoking Cessation Classes [phone]  CHOLESTEROL Know your levels; limit fat & cholesterol in your diet  Lipid Panel     Component Value Date/Time  CHOL 210* 07/02/2014 0658   TRIG 160* 07/02/2014 0658   HDL 44 07/02/2014 0658   CHOLHDL 4.8 07/02/2014 0658   VLDL 32 07/02/2014 0658   LDLCALC 134* 07/02/2014 0658      Many patients benefit from treatment even if their cholesterol is at goal.  Goal: Total Cholesterol (CHOL) less than 160  Goal:  Triglycerides (TRIG) less than 150  Goal:  HDL greater than 40  Goal:  LDL (LDLCALC)  less than 100   BLOOD PRESSURE American Stroke Association blood pressure target is less that 120/80 mm/Hg  Your discharge blood pressure is:  BP: 128/67 mmHg  Monitor your blood pressure  Limit your salt and alcohol intake  Many individuals will require more than one medication for high blood pressure  DIABETES (A1c is a blood sugar average for last 3 months) Goal HGBA1c is under 7% (HBGA1c is blood sugar average for last 3 months)  Diabetes: No known diagnosis of diabetes    Lab Results  Component Value Date   HGBA1C 6.0* 07/02/2014     Your HGBA1c can be lowered with medications, healthy diet, and exercise.  Check your blood sugar as directed by your physician  Call your physician if you experience unexplained or low blood sugars.  PHYSICAL ACTIVITY/REHABILITATION Goal is 30 minutes at least 4 days per week  Activity: Increase activity slowly, Therapies: Physical Therapy: Home Health, Occupational Therapy: Home Health and Speech Therapy: Home Health   Activity decreases your risk of heart attack and stroke and makes your heart stronger.  It helps control your weight and blood pressure; helps you relax and can improve your mood.  Participate in a regular exercise program.  Talk with your doctor about the best form of exercise for you (dancing, walking, swimming, cycling).  DIET/WEIGHT Goal is to maintain a healthy weight  Your discharge diet is: DIET DYS 3 Room service appropriate?: Yes; Fluid consistency:: Nectar Thick liquids Your height is:  Height: 5\' 7"  (170.2 cm) Your current weight is: Weight: 75.841 kg (167 lb 3.2 oz) Your Body Mass Index (BMI) is:  BMI (Calculated): 26.2  Following the type of diet specifically designed for you will help prevent another stroke.  Your goal Body Mass Index (BMI) is 19-24.  Healthy food habits can help reduce 3 risk factors for stroke:  High cholesterol, hypertension, and excess weight.  RESOURCES Stroke/Support Group:  Call  [phone]   STROKE EDUCATION PROVIDED/REVIEWED AND GIVEN TO PATIENT Stroke warning signs and symptoms How to activate emergency medical system (call 911). Medications prescribed at discharge. Need for follow-up after discharge. Personal risk factors for stroke. Pneumonia vaccine given: Yes, Date 07/04/14 Flu vaccine given: No My questions have been answered, the writing is legible, and I understand these instructions.  I will adhere to these goals & educational materials that have been provided to me after my discharge from the hospital.

## 2014-07-04 NOTE — Progress Notes (Addendum)
Speech Language Pathology Treatment: Dysphagia  Patient Details Name: Daniel Quinn MRN: [id number] DOB: 1945-08-13 Today's Date: 07/04/2014 Time: 1250-1310 SLP Time Calculation (min) (ACUTE ONLY): 20 min  Assessment / Plan / Recommendation Clinical Impression  Treatment focused on dysphagia education with daughter present who will be staying with pt after discharge today (? 1 week). Reviewed results with pt and daughter. Pt able to state right head turn strategy independently but needed min reminders to recall and implement multiple swallows and throat clears. Provided written and visual instructions regarding re: thicken up clear (how to thicken liquids, purchasing if need additional) and education for Dys 2 (chopped/minced texture) and reiterated compensatory strategies. Home Health ST ordered and have contacted family per daughter to schedule. No s/s aspiration observed during brief observation during lunch.   HPI HPI: Patient with no prior hx of CVA, presents with sudden onset at 2 PM on 07/02/14 of Right sided numbness and weakness, light-headedness, he stated he is unable to swallow. Reports slurred speech, reports being off balance and blurred vision, he reported diminished sensation along the right side of his body when he got in the shower he could not feel the hot water. . Patient arrived to ED at 10 PM and at this point he was out of window for any thrombolytic intervention   Pertinent Vitals Pain Assessment: No/denies pain  SLP Plan   (Discharged home today)    Recommendations Diet recommendations: Dysphagia 2 (fine chop);Nectar-thick liquid Liquids provided via: Cup;No straw Medication Administration:  (meds whole or 1/2 in puree) Supervision: Patient able to self feed;Intermittent supervision to cue for compensatory strategies Compensations: Slow rate;Small sips/bites;Multiple dry swallows after each bite/sip (right head turn) Postural Changes and/or Swallow Maneuvers: Head turn  right during swallow;Seated upright 90 degrees;Upright 30-60 min after meal              Oral Care Recommendations: Oral care BID Follow up Recommendations: Home health SLP Plan:  (Discharged home today)    GO     Houston Siren 07/04/2014, 2:11 PM  Orbie Pyo Colvin Caroli.[employer] [phone]

## 2014-07-04 NOTE — Progress Notes (Signed)
Patient discharged home with daughter. RN discussed discharge instructions and medications with patient and daughter. Patient and daughter vocalized understanding. Prescriptions and discharge instructions given to patient. Tele removed, IV removed. Neuro assessment stable.

## 2014-07-04 NOTE — Progress Notes (Signed)
CARE MANAGEMENT NOTE 07/04/2014  Patient:  RASHAUN, WICHERT   Account Number:  [id number]  Date Initiated:  07/04/2014  Documentation initiated by:  Lorne Skeens  Subjective/Objective Assessment:   Patient was admitted with CVA. Lives at home alone.     Action/Plan:   Will follow for discharge needs pending PT/OT evals and physician orders.   Anticipated DC Date:  07/04/2014   Anticipated DC Plan:  [place]  CM consult      Choice offered to / List presented to:  C-1 Patient        [place] arranged  [place].   Status of service:  Completed, signed off Medicare Important Message given?  YES (If response is "NO", the following Medicare IM given date fields will be blank) Date Medicare IM given:  07/04/2014 Medicare IM given by:  Lorne Skeens Date Additional Medicare IM given:   Additional Medicare IM given by:    Discharge Disposition:  [place]  Per UR Regulation:  Reviewed for med. necessity/level of care/duration of stay  If discussed at [place] of Stay Meetings, dates discussed:    Comments:  07/04/14 [place], MSN, CM- Met with patient to discuss home health needs. Patient is agreeable to home health and has chosen [facility]. Miranda with [facility] was notified and has accepted the referral for discharge home today.  Medicare IM letter provided.

## 2014-07-04 NOTE — Discharge Summary (Signed)
Daniel Quinn, is a 69 y.o. male  DOB 03/17/1946  MRN [id number].  Admission date:  07/01/2014  Admitting Physician  Toy Baker, MD  Discharge Date:  07/04/2014   Primary MD  No primary care provider on file.  Recommendations for primary care physician for things to follow:   Please arrange for one-time outpatient vascular surgery follow-up. Must follow with neurology within 1 month.  Monitor secondary to factors for stroke closely  Admission Diagnosis  Numbness [R20.0] Dysphagia [R13.10]   Discharge Diagnosis  Numbness [R20.0] Dysphagia [R13.10]     Active Problems:   Right hemiplegia   CVA (cerebral infarction)   Dysphagia   Dyslipidemia   Occlusion and stenosis of vertebral artery      Past Medical History  Diagnosis Date  . Thyroid disease   . Depression     Past Surgical History  Procedure Laterality Date  . Nose surgery         History of present illness and  Hospital Course:     Kindly see H&P for history of present illness and admission details, please review complete Labs, Consult reports and Test reports for all details in brief  HPI  from the history and physical done on the day of admission   Patient with no prior hx of CVA presents with Sudden onset at 2 PM of Right sided numbness and weakness, light-headedness, he states he is unable to swallow. Reports slurred speech, reports being off balance and blurred vision, he reports diminished sensation along the right side of his body when he got in the shower he could not feel the hot water. . Patient arriveded to emergency department at 10 PM. This point he was out of window for any thrombolytic intervention. CT scan of the head no acute findings. ER provider spoke to Jim Like with Neurology who recommends transfer to  [facility] for stroke workup. Of note patient had recently had a lot of allergy symptoms was treated with short less than one week prednisone taper which she is almost completed.Hospitalist was called for admission for  CVA  Hospital Course    1. 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 Causing initially right-sided weakness along with dysphagia, weakness had resolved but dysphagia persists. Seen by speech currently on dysphagia 1 diet with nectar thick liquids. Will be discharged home with home speech and OT.   Seen by neurology placed on aspirin for 3 months along with Plavix indefinitely. Also placed on statin as LDL was above goal. Of note he is prediabetic request PCP to monitor A1c closely. Echocardiogram stable. 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 Have requested him to follow with vascular surgery closely. No neuro IR recommended by neurology.   Needs aspirin and Plavix along with statin once taking food and medications orally.    Lab Results  Component Value Date   HGBA1C 6.0* 07/02/2014     Recent Labs    Lab Results  Component Value Date   CHOL 210* 07/02/2014  HDL 44 07/02/2014   LDLCALC 134* 07/02/2014   TRIG 160* 07/02/2014   CHOLHDL 4.8 07/02/2014       2. Hypothyroidism. Resume home dose Synthroid   3. GERD. On PPI.   4. Dyslipidemia placed on statin goal LDL less than 70         Discharge Condition: Stable   Follow UP  Follow-up Information    Follow up with Xu,Jindong, MD. Schedule an appointment as soon as possible for a visit in 2 months.   Specialty:  Neurology   Why:  stroke clinic   Contact information:   [address] [place] [address] [phone]       Follow up with Cammy Copa, MD. Schedule an appointment as soon as possible for a visit in 1 week.   Specialty:  Family Medicine   Contact information:   [address], [address] [place] [address] [phone]       Follow up with EARLY, TODD, MD. Schedule an appointment as soon as possible for a visit in 1 week.   Specialty:  Vascular Surgery   Why:  Carotid artery stenosis   Contact information:   [place] [address] [phone]         Discharge Instructions  and  Discharge Medications      Discharge Instructions    Ambulatory referral to Neurology    Complete by:  As directed   Pt will follow up with Dr. Erlinda Hong at [facility] in about 2 months. Thanks.     Discharge instructions    Complete by:  As directed   Follow with Primary MD  in 7 days   Get CBC, CMP, 2 view Chest X ray checked  by Primary MD next visit.    Activity: As tolerated with Full fall precautions use walker/cane & assistance as needed   Disposition Home     Diet:  Dysphagia 3 diet - Nectar thick liquids with feeding assistance and full Aspiration percautions  For Heart failure patients - Check your Weight same time everyday, if you gain over 2 pounds, or you develop in leg swelling, experience more shortness of breath or chest pain, call your Primary MD immediately. Follow Cardiac Low Salt Diet and 1.5 lit/day fluid restriction.   On your next visit with your primary care physician please Get Medicines reviewed and adjusted.   Please request your Prim.MD to go over all Hospital Tests and Procedure/Radiological results at the follow up, please get all Hospital records sent to your Prim MD by signing hospital release before you go home.   If you experience worsening of your admission symptoms, develop shortness of breath, life threatening emergency, suicidal or homicidal thoughts you must seek medical attention immediately by calling 911 or calling your MD immediately  if symptoms less severe.  You Must read complete instructions/literature along with all the possible adverse reactions/side effects for all the Medicines you take and that have been  prescribed to you. Take any new Medicines after you have completely understood and accpet all the possible adverse reactions/side effects.   Do not drive, operating heavy machinery, perform activities at heights, swimming or participation in water activities or provide baby sitting services if your were admitted for syncope or siezures until you have seen by Primary MD or a Neurologist and advised to do so again.  Do not drive when taking Pain medications.    Do not take more than prescribed Pain, Sleep and Anxiety  Medications  Special Instructions: If you have smoked or chewed Tobacco  in the last 2 yrs please stop smoking, stop any regular Alcohol  and or any Recreational drug use.  Wear Seat belts while driving.   Please note  You were cared for by a hospitalist during your hospital stay. If you have any questions about your discharge medications or the care you received while you were in the hospital after you are discharged, you can call the unit and asked to speak with the hospitalist on call if the hospitalist that took care of you is not available. Once you are discharged, your primary care physician will handle any further medical issues. Please note that NO REFILLS for any discharge medications will be authorized once you are discharged, as it is imperative that you return to your primary care physician (or establish a relationship with a primary care physician if you do not have one) for your aftercare needs so that they can reassess your need for medications and monitor your lab values.     Increase activity slowly    Complete by:  As directed             Medication List    STOP taking these medications        cefdinir 300 MG capsule  Commonly known as:  OMNICEF     ibuprofen 200 MG tablet  Commonly known as:  ADVIL,MOTRIN     predniSONE 10 MG tablet  Commonly known as:  DELTASONE      TAKE these medications        aspirin 81 MG EC tablet  Take 1 tablet (81 mg  total) by mouth daily. Stop Asprin in 3 months     atorvastatin 40 MG tablet  Commonly known as:  LIPITOR  Take 1 tablet (40 mg total) by mouth daily at 6 PM.     azelastine 0.1 % nasal spray  Commonly known as:  ASTELIN  Place 2 sprays into both nostrils 2 (two) times daily. Use in each nostril as directed     clopidogrel 75 MG tablet  Commonly known as:  PLAVIX  Take 1 tablet (75 mg total) by mouth daily.     FLUoxetine 20 MG capsule  Commonly known as:  PROZAC  Take 20 mg by mouth daily.     fluticasone 50 MCG/ACT nasal spray  Commonly known as:  FLONASE  Place 1 spray into both nostrils daily.     levocetirizine 5 MG tablet  Commonly known as:  XYZAL  Take 5 mg by mouth daily.     levothyroxine 112 MCG tablet  Commonly known as:  SYNTHROID, LEVOTHROID  Take 112 mcg by mouth daily before breakfast.     multivitamin with minerals Tabs tablet  Take 1 tablet by mouth daily.     RESOURCE THICKENUP CLEAR Powd  To make all liquids nectar thick, 1 month supply     vitamin C 1000 MG tablet  Take 1,000 mg by mouth daily.          Diet and Activity recommendation: See Discharge Instructions above   Consults obtained - Neuro   Major procedures and Radiology Reports - PLEASE review detailed and final reports for all details, in brief -     TTE  - Left ventricle: The cavity size was normal. Systolic function wasnormal. The estimated ejection fraction was in the range of 55% to 60%. Wall motion was normal; there were no regional wallmotion abnormalities. Left ventricular  diastolic functionparameters were normal. - Left atrium: The atrium was mildly dilated. - Atrial septum: No defect or patent foramen ovale was identified.    Dg Chest 2 View  07/02/2014   CLINICAL DATA:  Cough, dysphagia starting last night, question CVA  EXAM: CHEST  2 VIEW  COMPARISON:  None.  FINDINGS: Cardiomediastinal silhouette is unremarkable. No acute infiltrate or pleural effusion. No  pulmonary edema. Bony thorax is unremarkable.  IMPRESSION: No active cardiopulmonary disease.   Electronically Signed   By: Lahoma Crocker M.D.   On: 07/02/2014 09:44   Ct Head Wo Contrast  07/01/2014   CLINICAL DATA:  Patient presents for right-sided numbness, difficulty swallowing, bilateral blurry anus, and slurred speech. Onset 1400 hours.  EXAM: CT HEAD WITHOUT CONTRAST  TECHNIQUE: Contiguous axial images were obtained from the base of the skull through the vertex without intravenous contrast.  COMPARISON:  None.  FINDINGS: Diffuse cerebral atrophy. Mild ventricular dilatation consistent with central atrophy. Patchy low-attenuation changes in the deep white matter consistent with small vessel ischemia. Vascular calcifications. No mass effect or midline shift. No abnormal extra-axial fluid collections. Gray-white matter junctions are distinct. Basal cisterns are not effaced. No evidence of acute intracranial hemorrhage. No depressed skull fractures. Mucosal thickening in the paranasal sinuses with retention cyst in the left maxillary antrum and opacification of some of the ethmoid air cells. Mastoid air cells are not opacified.  IMPRESSION: No acute intracranial abnormalities. Mild chronic atrophy and small vessel ischemic changes.   Electronically Signed   By: Lucienne Capers M.D.   On: 07/01/2014 23:50   Ct Angio Neck W/cm &/or Wo/cm  07/03/2014   ADDENDUM REPORT: 07/03/2014 12:59  ADDENDUM: Vertebral artery findings discussed by telephone with Dr. Rosalin Hawking on 07/03/2014 at 1254 hrs.   Electronically Signed   By: Genevie Ann M.D.   On: 07/03/2014 12:59   07/03/2014   CLINICAL DATA:  69 year old male with right side weakness and this aphasia. Left medullary brainstem infarct. Abnormal left vertebral artery on brain MRI and MRA.  EXAM: CT ANGIOGRAPHY NECK  TECHNIQUE: Multidetector CT imaging of the neck was performed using the standard protocol during bolus administration of intravenous contrast. Multiplanar CT  image reconstructions and MIPs were obtained to evaluate the vascular anatomy. Carotid stenosis measurements (when applicable) are obtained utilizing NASCET criteria, using the distal internal carotid diameter as the denominator.  CONTRAST:  32mL OMNIPAQUE IOHEXOL 350 MG/ML SOLN  COMPARISON:  Brain MRI/ MRA 07/02/2014.  FINDINGS: Skeleton:  No acute osseous abnormality identified.  Other neck: Minor left maxillary sinus mucosal thickening.  Negative lung apices.  No superior mediastinal lymphadenopathy.  Evidence of left vocal cord paralysis. Negative AP window and no lower neck or upper chest mass or causative lesion identified.  Pharyngeal contours are within normal limits. Negative parapharyngeal spaces, retropharyngeal space, sublingual space, submandibular glands and parotid glands. No cervical lymphadenopathy.  Aortic arch: Bovine arch configuration with mild to moderate atherosclerosis. No great vessel origin stenosis.  Right carotid system: Negative right CCA. Soft and calcified plaque at the right carotid bifurcation resulting in less than 50 % stenosis with respect to the distal vessel of the proximal right ICA. Negative cervical right ICA otherwise.  Left carotid system: Negative left CCA. Soft and calcified plaque at the left carotid bifurcation resulting in less than 50 % stenosis with respect to the distal vessel of the proximal left ICA. Negative cervical left ICA otherwise.  Vertebral arteries:  No proximal right subclavian hemodynamically significant stenosis  despite proximal soft and calcified plaque. Mild stenosis at the origin of the right vertebral artery which now appears dominant. Negative right vertebral artery otherwise to the right PICA and vertebrobasilar junctions.  No proximal left subclavian artery stenosis despite mild plaque. There is soft plaque at the left vertebral artery origin resulting in severe stenosis best seen on coronal image 138 of series 8. Nevertheless, the left  vertebral artery is patent in the V1 and V2 segments. However, by the distal V2 segment and demonstrates asymmetrically decreased enhancement and becomes more in more diminutive. The vessel then somewhat gradually occludes at the C1 ring level (see series 7, images 195 and 196). The left V4 segment is occluded except for probable retrograde supply to the distal vessel. The left PICA is incompletely visible but enhancing.  IMPRESSION: 1. 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 This more resembles sequelae of atherosclerotic disease than a focal dissection. There is also high-grade stenosis at the left vertebral artery origin due to soft plaque. 2. Mild right vertebral artery origin stenosis. Bilateral carotid bifurcation plaque without hemodynamically significant cervical carotid stenosis. 3. 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 No neck mass or lymphadenopathy identified.  Electronically Signed: By: Genevie Ann M.D. On: 07/03/2014 12:02   Mr Brain Wo Contrast  07/02/2014   CLINICAL DATA:  Initial evaluation for acute onset of slurred speech, right-sided motor/ sensory symptoms.  EXAM: MRI HEAD WITHOUT CONTRAST  MRA HEAD WITHOUT CONTRAST  TECHNIQUE: Multiplanar, multiecho pulse sequences of the brain and surrounding structures were obtained without intravenous contrast. Angiographic images of the head were obtained using MRA technique without contrast.  COMPARISON:  Prior CT from 07/01/2014.  FINDINGS: MRI HEAD FINDINGS  Diffuse prominence of the CSF containing spaces is compatible with generalized cerebral atrophy. Very mild chronic small vessel ischemic type changes present within the periventricular white matter. There are small remote lacunar infarcts involving the bilateral thalami.  On DWI sequence, there is apparent 11 mm hyperintense signal intensity seen within the left aspect of the lower medulla (series 4, image 9 on axial DWI sequence,  series 7, image 17, on coronal DWI sequence). There is question of subtly increased signal intensity within this region on T2/FLAIR sequences. Finding suspicious for a small acute ischemic infarct, although possible artifact could also be considered.  No other abnormal foci of restricted diffusion to suggest acute intracranial infarct identified. Gray-white matter differentiation otherwise maintained.  No acute or chronic intracranial hemorrhage.  No mass lesion or midline shift. No hydrocephalus. No extra-axial fluid collection.  Craniocervical junction within normal limits. Pituitary gland normal. No acute abnormality about the orbits. Small retention cyst present within the left maxillary sinus. There is scattered opacity within the anterior ethmoidal air cells, left greater than right. No air-fluid level to suggest active sinus infection. No mastoid effusion. Inner ear structures normal.  Bone marrow signal intensity within normal limits. Scalp soft tissues unremarkable.  MRA HEAD FINDINGS  ANTERIOR CIRCULATION:  Visualized portions of the distal cervical segments of the internal carotid arteries are widely patent with antegrade flow. The petrous segments are widely patent. Multi focal atheromatous irregularity present within the cavernous segments of the internal carotid arteries bilaterally with associated mild multi focal stenoses. The supraclinoid segments are widely patent.  Right A1 segment is hypoplastic but patent. Left A1 segment widely patent. Anterior communicating artery and anterior cerebral arteries well opacified.  M1 segments widely patent without stenosis or  occlusion. Right MCA bifurcates. Left MCA trifurcates. Distal MCA branches fairly symmetric and well opacified bilaterally. Distal MCA branch atheromatous irregularity present.  POSTERIOR CIRCULATION:  Mild atheromatous irregularity present within the proximal right vertebral artery as it courses into the cranial vault. The right vertebral  artery is otherwise well opacified to the vertebrobasilar junction. The left vertebral artery is not visualize, occluded in the neck. There is some flow within the distal left vertebral artery just prior to the vertebrobasilar junction likely via flow from the contralateral right vertebral artery system. Posterior inferior cerebral arteries are patent. Anterior inferior cerebellar arteries opacified proximally. Minimal atheromatous irregularity present within the basilar artery without stenosis or occlusion. Superior cerebellar arteries patent bilaterally. There is a short segment severe stenosis within the mid left P2 segment (series 505, image 5). Left posterior cerebral artery otherwise widely patent. Right P1 segment is hypoplastic with a prominent right posterior communicating artery. Right P2 segment well opacified. Distal branch irregularity present within the PCA arteries bilaterally.  No aneurysm or vascular malformation.  IMPRESSION: MRI HEAD IMPRESSION:  1. Linear 11 mm hyperintense focus on DWI sequence within the left lateral medulla as above, suspicious for possible small acute ischemic infarct. Correlation with symptomatology for possible acute ischemic infarct at this location recommended. 2. Remote lacunar infarcts involving the bilateral thalami. 3. Age-related cerebral atrophy.  MRA HEAD IMPRESSION:  1. 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 There is some flow within the distal left vertebral artery prior to vertebrobasilar junction via collateral flow from the right vertebral artery system. 2. Short-segment severe stenosis within the mid left P2 segment. 3. Mild multi focal atheromatous irregularity within the cavernous ICAs bilaterally with associated mild multi focal narrowing. 4. Mild distal branch atheromatous irregularity within the MCA and PCA branches bilaterally.   Electronically Signed   By: Jeannine Boga M.D.   On: 07/02/2014 07:35   Dg Swallowing Func-speech  Pathology  07/03/2014    Objective Swallowing Evaluation:   Modified Barium Swallow Study Patient Details  Name: Daniel Quinn MRN: [id number] Date of Birth: [birth year]  Today's Date: 07/03/2014 Time: SLP Start Time (ACUTE ONLY): 1005-SLP Stop Time (ACUTE ONLY): 1025 SLP Time Calculation (min) (ACUTE ONLY): 20 min  Past Medical History:  Past Medical History  Diagnosis Date  . Thyroid disease   . Depression    Past Surgical History:  Past Surgical History  Procedure Laterality Date  . Nose surgery     HPI:  HPI: Patient with no prior hx of CVA, presents with sudden onset at 2 PM  on 07/02/14 of Right sided numbness and weakness, light-headedness, he  stated he is unable to swallow. Reports slurred speech, reports being off  balance and blurred vision, he reported diminished sensation along the  right side of his body when he got in the shower he could not feel the hot  water. . Patient arrived to ED at 10 PM and at this point he was out of  window for any thrombolytic intervention  No Data Recorded  Assessment / Plan / Recommendation CHL IP CLINICAL IMPRESSIONS 07/03/2014  Dysphagia Diagnosis Moderate pharyngeal phase dysphagia;Severe pharyngeal  phase dysphagia  Clinical impression Pt exhibited a moderate-severe sensorimotor based  pharyngeal dysphagia following medullary CVA due to decreased sensation  (initiation at pyriform sinuses), decreased laryngeal elevation in  addition to reduced tongue base retraction. Laryngeal penetration present  prior to and during swallow not decreased using the supraglottic swallow  technique (resulted in silent aspiration). Honey thick barium  led to  increased vallecular and pyriform sinus residue and penetration to vocal  cords. Penetration (ranging from minimal vestibule entrance to cord level)  with medium-large sips nectar leading to inconsistent soft throat clears.  A right head turn slightly decreased laryngeal residue. He required verbal  cues for volitional coughs/throat  clears/multiple swallow with excellent  implementation with intact cognition. Pt must follow precautions for  recommendations of Dys 3 diet texture, nectar thick liquids, SMALL sips,  no straws, right head turn with all swallows, 2-3 extra swallows and pills  whole in applesauce. Pt's mobility and WFL cognition are assests, however  pt must demonstrate diligence with strategies. ST will follow while in  acute care with recommendation of outpt (?).       CHL IP TREATMENT RECOMMENDATION 07/03/2014  Treatment Plan Recommendations Therapy as outlined in treatment plan below      CHL IP DIET RECOMMENDATION 07/03/2014  Diet Recommendations Dysphagia 3 (Mechanical Soft);Nectar-thick liquid  Liquid Administration via Cup;No straw  Medication Administration Whole meds with puree  Compensations Slow rate;Small sips/bites;Multiple dry swallows after each  bite/sip  Postural Changes and/or Swallow Maneuvers Head turn right during  swallow;Seated upright 90 degrees;Upright 30-60 min after meal     CHL IP OTHER RECOMMENDATIONS 07/03/2014  Recommended Consults (None)  Oral Care Recommendations Oral care BID  Other Recommendations Order thickener from pharmacy     CHL IP FOLLOW UP RECOMMENDATIONS 07/03/2014  Follow up Recommendations Outpatient SLP     CHL IP FREQUENCY AND DURATION 07/03/2014  Speech Therapy Frequency (ACUTE ONLY) min 2x/week  Treatment Duration 2 weeks     Pertinent Vitals/Pain none    SLP Swallow Goals No flowsheet data found.  No flowsheet data found.    CHL IP REASON FOR REFERRAL 07/03/2014  Reason for Referral Objectively evaluate swallowing function     CHL IP ORAL PHASE 07/03/2014  Lips (None)  Tongue (None)  Mucous membranes (None)  Nutritional status (None)  Other (None)  Oxygen therapy (None)  Oral Phase WFL  Oral - Pudding Teaspoon (None)  Oral - Pudding Cup (None)  Oral - Honey Teaspoon (None)  Oral - Honey Cup (None)  Oral - Honey Syringe (None)  Oral - Nectar Teaspoon (None)  Oral - Nectar Cup (None)  Oral -  Nectar Straw (None)  Oral - Nectar Syringe (None)  Oral - Ice Chips (None)  Oral - Thin Teaspoon (None)  Oral - Thin Cup (None)  Oral - Thin Straw (None)  Oral - Thin Syringe (None)  Oral - Puree (None)  Oral - Mechanical Soft (None)  Oral - Regular (None)  Oral - Multi-consistency (None)  Oral - Pill (None)  Oral Phase - Comment (None)      CHL IP PHARYNGEAL PHASE 07/03/2014  Pharyngeal Phase Impaired  Pharyngeal - Pudding Teaspoon (None)  Penetration/Aspiration details (pudding teaspoon) (None)  Pharyngeal - Pudding Cup (None)  Penetration/Aspiration details (pudding cup) (None)  Pharyngeal - Honey Teaspoon Pharyngeal residue - valleculae;Pharyngeal  residue - pyriform sinuses;Reduced tongue base retraction;Reduced  laryngeal elevation;Delayed swallow initiation;Premature spillage to  pyriform sinuses  Penetration/Aspiration details (honey teaspoon) (None)  Pharyngeal - Honey Cup (None)  Penetration/Aspiration details (honey cup) (None)  Pharyngeal - Honey Syringe (None)  Penetration/Aspiration details (honey syringe) (None)  Pharyngeal - Nectar Teaspoon Delayed swallow initiation;Premature spillage  to pyriform sinuses;Pharyngeal residue - pyriform sinuses;Pharyngeal  residue - valleculae;Reduced laryngeal elevation;Reduced tongue base  retraction;Penetration/Aspiration during swallow  Penetration/Aspiration details (nectar teaspoon) Material enters airway,  remains ABOVE vocal  cords then ejected out;Material enters airway, remains  ABOVE vocal cords and not ejected out  Pharyngeal - Nectar Cup Delayed swallow initiation;Premature spillage to  pyriform sinuses;Pharyngeal residue - valleculae;Pharyngeal residue -  pyriform sinuses;Reduced tongue base retraction;Reduced laryngeal  elevation;Penetration/Aspiration before swallow;Trace aspiration  Penetration/Aspiration details (nectar cup) Material enters airway, passes  BELOW cords and not ejected out despite cough attempt by patient;Material  enters airway, passes  BELOW cords without attempt by patient to eject out  (silent aspiration);Material enters airway, remains ABOVE vocal cords and  not ejected out;Material enters airway, CONTACTS cords and not ejected out   Pharyngeal - Nectar Straw (None)  Penetration/Aspiration details (nectar straw) (None)  Pharyngeal - Nectar Syringe (None)  Penetration/Aspiration details (nectar syringe) (None)  Pharyngeal - Ice Chips (None)  Penetration/Aspiration details (ice chips) (None)  Pharyngeal - Thin Teaspoon Penetration/Aspiration during swallow;Delayed  swallow initiation;Pharyngeal residue - valleculae;Pharyngeal residue -  pyriform sinuses;Reduced laryngeal elevation;Reduced tongue base  retraction  Penetration/Aspiration details (thin teaspoon) Material enters airway,  CONTACTS cords and not ejected out  Pharyngeal - Thin Cup (None)  Penetration/Aspiration details (thin cup) (None)  Pharyngeal - Thin Straw (None)  Penetration/Aspiration details (thin straw) (None)  Pharyngeal - Thin Syringe (None)  Penetration/Aspiration details (thin syringe') (None)  Pharyngeal - Puree (None)  Penetration/Aspiration details (puree) (None)  Pharyngeal - Mechanical Soft (None)  Penetration/Aspiration details (mechanical soft) (None)  Pharyngeal - Regular Delayed swallow initiation;Premature spillage to  pyriform sinuses;Pharyngeal residue - pyriform sinuses;Reduced tongue base  retraction  Penetration/Aspiration details (regular) (None)  Pharyngeal - Multi-consistency (None)  Penetration/Aspiration details (multi-consistency) (None)  Pharyngeal - Pill (None)  Penetration/Aspiration details (pill) (None)  Pharyngeal Comment (None)     CHL IP CERVICAL ESOPHAGEAL PHASE 07/03/2014  Cervical Esophageal Phase WFL  Pudding Teaspoon (None)  Pudding Cup (None)  Honey Teaspoon (None)  Honey Cup (None)  Honey Syringe (None)  Nectar Teaspoon (None)  Nectar Cup (None)  Nectar Straw (None)  Nectar Syringe (None)  Thin Teaspoon (None)  Thin Cup (None)  Thin Straw  (None)  Thin Syringe (None)  Cervical Esophageal Comment (None)    No flowsheet data found.         Houston Siren 07/03/2014, 11:34 AM  Orbie Pyo Colvin Caroli.Ed CCC-SLP Pager [phone]     Mr Jodene Nam Head/brain Wo Cm  07/02/2014   CLINICAL DATA:  Initial evaluation for acute onset of slurred speech, right-sided motor/ sensory symptoms.  EXAM: MRI HEAD WITHOUT CONTRAST  MRA HEAD WITHOUT CONTRAST  TECHNIQUE: Multiplanar, multiecho pulse sequences of the brain and surrounding structures were obtained without intravenous contrast. Angiographic images of the head were obtained using MRA technique without contrast.  COMPARISON:  Prior CT from 07/01/2014.  FINDINGS: MRI HEAD FINDINGS  Diffuse prominence of the CSF containing spaces is compatible with generalized cerebral atrophy. Very mild chronic small vessel ischemic type changes present within the periventricular white matter. There are small remote lacunar infarcts involving the bilateral thalami.  On DWI sequence, there is apparent 11 mm hyperintense signal intensity seen within the left aspect of the lower medulla (series 4, image 9 on axial DWI sequence, series 7, image 17, on coronal DWI sequence). There is question of subtly increased signal intensity within this region on T2/FLAIR sequences. Finding suspicious for a small acute ischemic infarct, although possible artifact could also be considered.  No other abnormal foci of restricted diffusion to suggest acute intracranial infarct identified. Gray-white matter differentiation otherwise maintained.  No acute or chronic intracranial hemorrhage.  No  mass lesion or midline shift. No hydrocephalus. No extra-axial fluid collection.  Craniocervical junction within normal limits. Pituitary gland normal. No acute abnormality about the orbits. Small retention cyst present within the left maxillary sinus. There is scattered opacity within the anterior ethmoidal air cells, left greater than right. No air-fluid level to  suggest active sinus infection. No mastoid effusion. Inner ear structures normal.  Bone marrow signal intensity within normal limits. Scalp soft tissues unremarkable.  MRA HEAD FINDINGS  ANTERIOR CIRCULATION:  Visualized portions of the distal cervical segments of the internal carotid arteries are widely patent with antegrade flow. The petrous segments are widely patent. Multi focal atheromatous irregularity present within the cavernous segments of the internal carotid arteries bilaterally with associated mild multi focal stenoses. The supraclinoid segments are widely patent.  Right A1 segment is hypoplastic but patent. Left A1 segment widely patent. Anterior communicating artery and anterior cerebral arteries well opacified.  M1 segments widely patent without stenosis or occlusion. Right MCA bifurcates. Left MCA trifurcates. Distal MCA branches fairly symmetric and well opacified bilaterally. Distal MCA branch atheromatous irregularity present.  POSTERIOR CIRCULATION:  Mild atheromatous irregularity present within the proximal right vertebral artery as it courses into the cranial vault. The right vertebral artery is otherwise well opacified to the vertebrobasilar junction. The left vertebral artery is not visualize, occluded in the neck. There is some flow within the distal left vertebral artery just prior to the vertebrobasilar junction likely via flow from the contralateral right vertebral artery system. Posterior inferior cerebral arteries are patent. Anterior inferior cerebellar arteries opacified proximally. Minimal atheromatous irregularity present within the basilar artery without stenosis or occlusion. Superior cerebellar arteries patent bilaterally. There is a short segment severe stenosis within the mid left P2 segment (series 505, image 5). Left posterior cerebral artery otherwise widely patent. Right P1 segment is hypoplastic with a prominent right posterior communicating artery. Right P2 segment well  opacified. Distal branch irregularity present within the PCA arteries bilaterally.  No aneurysm or vascular malformation.  IMPRESSION: MRI HEAD IMPRESSION:  1. Linear 11 mm hyperintense focus on DWI sequence within the left lateral medulla as above, suspicious for possible small acute ischemic infarct. Correlation with symptomatology for possible acute ischemic infarct at this location recommended. 2. Remote lacunar infarcts involving the bilateral thalami. 3. Age-related cerebral atrophy.  MRA HEAD IMPRESSION:  1. 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 There is some flow within the distal left vertebral artery prior to vertebrobasilar junction via collateral flow from the right vertebral artery system. 2. Short-segment severe stenosis within the mid left P2 segment. 3. Mild multi focal atheromatous irregularity within the cavernous ICAs bilaterally with associated mild multi focal narrowing. 4. Mild distal branch atheromatous irregularity within the MCA and PCA branches bilaterally.   Electronically Signed   By: Jeannine Boga M.D.   On: 07/02/2014 07:35    Micro Results      No results found for this or any previous visit (from the past 240 hour(s)).     Today   Subjective:   Daniel Quinn today has no headache,no chest abdominal pain,no new weakness tingling or numbness, feels much better wants to go home today.    Objective:   Blood pressure 148/83, pulse 80, temperature 100.2 F (37.9 C), temperature source Oral, resp. rate 20, height 5\' 7"  (1.702 m), weight 75.841 kg (167 lb 3.2 oz), SpO2 96 %.   Intake/Output Summary (Last 24 hours) at 07/04/14 0829 Last data filed at 07/03/14 1700  Gross  per 24 hour  Intake    360 ml  Output      0 ml  Net    360 ml    Exam Awake Alert, Oriented x 3, No new F.N deficits, Normal affect [place].AT,PERRAL Supple Neck,No JVD, No cervical lymphadenopathy appriciated.  Symmetrical Chest wall movement, Good air movement bilaterally,  CTAB RRR,No Gallops,Rubs or new Murmurs, No Parasternal Heave +ve B.Sounds, Abd Soft, Non tender, No organomegaly appriciated, No rebound -guarding or rigidity. No Cyanosis, Clubbing or edema, No new Rash or bruise  Data Review   CBC w Diff: Lab Results  Component Value Date   WBC 11.5* 07/01/2014   HGB 16.0 07/01/2014   HCT 47.0 07/01/2014   PLT 273 07/01/2014   LYMPHOPCT 18 07/01/2014   MONOPCT 5 07/01/2014   EOSPCT 0 07/01/2014   BASOPCT 0 07/01/2014    CMP: Lab Results  Component Value Date   NA 135 07/03/2014   K 3.8 07/03/2014   CL 102 07/03/2014   CO2 30 07/03/2014   BUN 18 07/03/2014   CREATININE 0.90 07/03/2014   PROT 7.0 07/01/2014   ALBUMIN 3.8 07/01/2014   BILITOT 0.9 07/01/2014   ALKPHOS 66 07/01/2014   AST 29 07/01/2014   ALT 31 07/01/2014  . Lab Results  Component Value Date   HGBA1C 6.0* 07/02/2014    Lab Results  Component Value Date   CHOL 210* 07/02/2014   HDL 44 07/02/2014   LDLCALC 134* 07/02/2014   TRIG 160* 07/02/2014   CHOLHDL 4.8 07/02/2014      Total Time in preparing paper work, data evaluation and todays exam - 35 minutes  Thurnell Lose M.D on 07/04/2014 at 8:29 AM  [facility]   Office  [phone]

## 2014-07-05 DIAGNOSIS — I69391 Dysphagia following cerebral infarction: Secondary | ICD-10-CM | POA: Diagnosis not present

## 2014-07-05 DIAGNOSIS — E785 Hyperlipidemia, unspecified: Secondary | ICD-10-CM | POA: Diagnosis not present

## 2014-07-05 DIAGNOSIS — J3081 Allergic rhinitis due to animal (cat) (dog) hair and dander: Secondary | ICD-10-CM | POA: Diagnosis not present

## 2014-07-05 DIAGNOSIS — J3089 Other allergic rhinitis: Secondary | ICD-10-CM | POA: Diagnosis not present

## 2014-07-05 DIAGNOSIS — J301 Allergic rhinitis due to pollen: Secondary | ICD-10-CM | POA: Diagnosis not present

## 2014-07-05 DIAGNOSIS — I69351 Hemiplegia and hemiparesis following cerebral infarction affecting right dominant side: Secondary | ICD-10-CM | POA: Diagnosis not present

## 2014-07-06 DIAGNOSIS — I69351 Hemiplegia and hemiparesis following cerebral infarction affecting right dominant side: Secondary | ICD-10-CM | POA: Diagnosis not present

## 2014-07-06 DIAGNOSIS — I69391 Dysphagia following cerebral infarction: Secondary | ICD-10-CM | POA: Diagnosis not present

## 2014-07-06 DIAGNOSIS — E785 Hyperlipidemia, unspecified: Secondary | ICD-10-CM | POA: Diagnosis not present

## 2014-07-10 DIAGNOSIS — I69391 Dysphagia following cerebral infarction: Secondary | ICD-10-CM | POA: Diagnosis not present

## 2014-07-10 DIAGNOSIS — I69351 Hemiplegia and hemiparesis following cerebral infarction affecting right dominant side: Secondary | ICD-10-CM | POA: Diagnosis not present

## 2014-07-10 DIAGNOSIS — J3089 Other allergic rhinitis: Secondary | ICD-10-CM | POA: Diagnosis not present

## 2014-07-10 DIAGNOSIS — J301 Allergic rhinitis due to pollen: Secondary | ICD-10-CM | POA: Diagnosis not present

## 2014-07-10 DIAGNOSIS — E785 Hyperlipidemia, unspecified: Secondary | ICD-10-CM | POA: Diagnosis not present

## 2014-07-10 DIAGNOSIS — J3081 Allergic rhinitis due to animal (cat) (dog) hair and dander: Secondary | ICD-10-CM | POA: Diagnosis not present

## 2014-07-11 ENCOUNTER — Encounter: Payer: Self-pay | Admitting: Surgery

## 2014-07-11 ENCOUNTER — Other Ambulatory Visit (HOSPITAL_COMMUNITY): Payer: Self-pay | Admitting: Family Medicine

## 2014-07-11 DIAGNOSIS — R1310 Dysphagia, unspecified: Secondary | ICD-10-CM

## 2014-07-11 DIAGNOSIS — I69391 Dysphagia following cerebral infarction: Secondary | ICD-10-CM | POA: Diagnosis not present

## 2014-07-11 DIAGNOSIS — I6501 Occlusion and stenosis of right vertebral artery: Secondary | ICD-10-CM | POA: Diagnosis not present

## 2014-07-11 DIAGNOSIS — E039 Hypothyroidism, unspecified: Secondary | ICD-10-CM | POA: Diagnosis not present

## 2014-07-11 DIAGNOSIS — I6529 Occlusion and stenosis of unspecified carotid artery: Secondary | ICD-10-CM | POA: Diagnosis not present

## 2014-07-11 DIAGNOSIS — G319 Degenerative disease of nervous system, unspecified: Secondary | ICD-10-CM | POA: Diagnosis not present

## 2014-07-11 DIAGNOSIS — I639 Cerebral infarction, unspecified: Secondary | ICD-10-CM | POA: Diagnosis not present

## 2014-07-11 DIAGNOSIS — E785 Hyperlipidemia, unspecified: Secondary | ICD-10-CM | POA: Diagnosis not present

## 2014-07-11 DIAGNOSIS — I6502 Occlusion and stenosis of left vertebral artery: Secondary | ICD-10-CM | POA: Diagnosis not present

## 2014-07-11 DIAGNOSIS — F329 Major depressive disorder, single episode, unspecified: Secondary | ICD-10-CM | POA: Diagnosis not present

## 2014-07-12 ENCOUNTER — Encounter: Payer: Self-pay | Admitting: Surgery

## 2014-07-12 ENCOUNTER — Ambulatory Visit (INDEPENDENT_AMBULATORY_CARE_PROVIDER_SITE_OTHER): Payer: Medicare Other | Admitting: Surgery

## 2014-07-12 VITALS — BP 132/78 | HR 68 | Temp 98.2°F | Resp 16 | Ht 67.0 in | Wt 166.3 lb

## 2014-07-12 DIAGNOSIS — I639 Cerebral infarction, unspecified: Secondary | ICD-10-CM | POA: Diagnosis not present

## 2014-07-12 DIAGNOSIS — I63219 Cerebral infarction due to unspecified occlusion or stenosis of unspecified vertebral arteries: Secondary | ICD-10-CM

## 2014-07-12 NOTE — Progress Notes (Signed)
Patient name: Daniel Quinn MRN: [id number] DOB: 02-Mar-1946 Sex: male   Referred by: Hospital service  Reason for referral:  Chief Complaint  Patient presents with  . Carotid    Recent CVA, seen at [facility].  Has right side weakness, swallowing difficulty (sees Speech Therapy)    HISTORY OF PRESENT ILLNESS: This is a 69 year old gentleman who presented to the emergency department on 07/02/2014 with right-sided numbness and weakness as well as lightheadedness.  He stated that he was unable to swallow and reported slurred speech, balance difficulty and blurry vision.  He also reported diminished sensation along the right side of his body and could not tell temperature differences.  CT scan of the head showed no acute findings.  He was not given TPA.  MRI revealed small hyperintensity focus in the left lateral but developed suspicious for small acute infarct.  There were remote lacunar infarcts involving the bilateral the thalami.  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  There was mild narrowing at the origin of the right vertebral artery.  He had a CT angiogram which showed widely patent carotid arteries bilaterally.  He is currently getting speech therapy with swallowing training.  He is on dual antiplatelet therapy with aspirin and Plavix.  He is taking Lipitor for hypercholesterolemia.  He is a nonsmoker.  Past Medical History  Diagnosis Date  . Thyroid disease   . Depression     Past Surgical History  Procedure Laterality Date  . Nose surgery      History   Social History  . Marital Status: Widowed    Spouse Name: N/A  . Number of Children: N/A  . Years of Education: N/A   Occupational History  . Not on file.   Social History Main Topics  . Smoking status: Never Smoker   . Smokeless tobacco: Not on file  . Alcohol Use: Yes     Comment: socially  . Drug Use: No  . Sexual Activity: Not on file    Other Topics Concern  . Not on file   Social History Narrative    Family History  Problem Relation Age of Onset  . Stroke Mother   . COPD Father   . Diabetes type II Neg Hx   . CAD Brother     Allergies as of 07/12/2014  . (No Known Allergies)    Current Outpatient Prescriptions on File Prior to Visit  Medication Sig Dispense Refill  . Ascorbic Acid (VITAMIN C) 1000 MG tablet Take 1,000 mg by mouth daily.    Marland Kitchen aspirin EC 81 MG EC tablet Take 1 tablet (81 mg total) by mouth daily. Stop Asprin in 3 months 30 tablet 2  . atorvastatin (LIPITOR) 40 MG tablet Take 1 tablet (40 mg total) by mouth daily at 6 PM. 30 tablet 0  . azelastine (ASTELIN) 0.1 % nasal spray Place 2 sprays into both nostrils 2 (two) times daily. Use in each nostril as directed    . clopidogrel (PLAVIX) 75 MG tablet Take 1 tablet (75 mg total) by mouth daily. 30 tablet 3  . FLUoxetine (PROZAC) 20 MG capsule Take 20 mg by mouth daily.    . fluticasone (FLONASE) 50 MCG/ACT nasal spray Place 1 spray into both nostrils daily.    Marland Kitchen levocetirizine (XYZAL) 5 MG tablet Take 5 mg by mouth daily.    Marland Kitchen levothyroxine (SYNTHROID, LEVOTHROID) 112 MCG tablet Take 112 mcg by  mouth daily before breakfast.    . Maltodextrin-Xanthan Gum (RESOURCE THICKENUP CLEAR) POWD To make all liquids nectar thick, 1 month supply 125 g 1  . Multiple Vitamin (MULTIVITAMIN WITH MINERALS) TABS tablet Take 1 tablet by mouth daily.     No current facility-administered medications on file prior to visit.     REVIEW OF SYSTEMS: Cardiovascular: No chest pain, chest pressure, palpitations, orthopnea, or dyspnea on exertion. No claudication or rest pain,  positive history of blood clot Pulmonary: No productive cough, asthma or wheezing. Neurologic: See history of present illness Hematologic: No bleeding problems or clotting disorders. Musculoskeletal: No joint pain or joint swelling. Gastrointestinal: No blood in stool or  hematemesis Genitourinary: No dysuria or hematuria. Psychiatric:: No history of major depression. Integumentary: No rashes or ulcers. Constitutional: No fever or chills.  PHYSICAL EXAMINATION:  Filed Vitals:   07/12/14 0924 07/12/14 0926  BP: 142/81 132/78  Pulse: 70 68  Temp: 98.2 F (36.8 C)   TempSrc: Oral   Resp: 16   Height: 5\' 7"  (1.702 m)   Weight: 166 lb 4.8 oz (75.433 kg)   SpO2: 98%    Body mass index is 26.04 kg/(m^2). General: The patient appears their stated age.   HEENT:  No gross abnormalities Pulmonary: Respirations are non-labored Abdomen: Soft and non-tender  Musculoskeletal: There are no major deformities.   Neurologic: No focal weakness or paresthesias are detected, Skin: There are no ulcer or rashes noted. Psychiatric: The patient has normal affect. Cardiovascular: There is a regular rate and rhythm without significant murmur appreciated. No carotid bruits  Diagnostic Studies: I have reviewed his CT angiogram which has the following findings:  1. 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 This more resembles sequelae of atherosclerotic disease than a focal dissection. There is also high-grade stenosis at the left vertebral artery origin due to soft plaque. 2. Mild right vertebral artery origin stenosis. Bilateral carotid bifurcation plaque without hemodynamically significant cervical carotid stenosis. 3. 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 No neck mass or lymphadenopathy identified.  I have reviewed his MRI which has been following findings: 1. Linear 11 mm hyperintense focus on DWI sequence within the left lateral medulla as above, suspicious for possible small acute ischemic infarct. Correlation with symptomatology for possible acute ischemic infarct at this location recommended. 2. Remote lacunar infarcts involving the bilateral thalami. 3. Age-related cerebral  atrophy.    Assessment:  Status post stroke Plan: After reviewing his images, his extracranial carotid system is widely patent.  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  No surgical treatment of the vertebral or carotid arteries is recommended at this time.  I stressed the importance of medical management including dual antiplatelet therapy, cholesterol management and blood pressure control.  I did state that he should probably have a follow-up carotid duplex in one year.  I will let neurology arrange this since there will likely be following him on a long-term basis.  He'll follow with me as needed.     Eldridge Abrahams, M.D. [facility] Office: [phone] Pager:  [phone]

## 2014-07-13 ENCOUNTER — Ambulatory Visit (HOSPITAL_COMMUNITY)
Admission: RE | Admit: 2014-07-13 | Discharge: 2014-07-13 | Disposition: A | Discharge status: Home / Self Care | Payer: Medicare Other | Source: Ambulatory Visit | Attending: Family Medicine | Admitting: Family Medicine

## 2014-07-13 DIAGNOSIS — R1310 Dysphagia, unspecified: Secondary | ICD-10-CM | POA: Diagnosis not present

## 2014-07-13 DIAGNOSIS — Z8673 Personal history of transient ischemic attack (TIA), and cerebral infarction without residual deficits: Secondary | ICD-10-CM | POA: Diagnosis not present

## 2014-07-13 DIAGNOSIS — K449 Diaphragmatic hernia without obstruction or gangrene: Secondary | ICD-10-CM | POA: Insufficient documentation

## 2014-07-14 DIAGNOSIS — I69391 Dysphagia following cerebral infarction: Secondary | ICD-10-CM | POA: Diagnosis not present

## 2014-07-14 DIAGNOSIS — E785 Hyperlipidemia, unspecified: Secondary | ICD-10-CM | POA: Diagnosis not present

## 2014-07-14 DIAGNOSIS — I69351 Hemiplegia and hemiparesis following cerebral infarction affecting right dominant side: Secondary | ICD-10-CM | POA: Diagnosis not present

## 2014-07-19 DIAGNOSIS — J301 Allergic rhinitis due to pollen: Secondary | ICD-10-CM | POA: Diagnosis not present

## 2014-07-19 DIAGNOSIS — J3081 Allergic rhinitis due to animal (cat) (dog) hair and dander: Secondary | ICD-10-CM | POA: Diagnosis not present

## 2014-07-19 DIAGNOSIS — J3089 Other allergic rhinitis: Secondary | ICD-10-CM | POA: Diagnosis not present

## 2014-07-24 DIAGNOSIS — J301 Allergic rhinitis due to pollen: Secondary | ICD-10-CM | POA: Diagnosis not present

## 2014-07-24 DIAGNOSIS — M542 Cervicalgia: Secondary | ICD-10-CM | POA: Diagnosis not present

## 2014-07-24 DIAGNOSIS — M9902 Segmental and somatic dysfunction of thoracic region: Secondary | ICD-10-CM | POA: Diagnosis not present

## 2014-07-24 DIAGNOSIS — M9901 Segmental and somatic dysfunction of cervical region: Secondary | ICD-10-CM | POA: Diagnosis not present

## 2014-07-24 DIAGNOSIS — J3089 Other allergic rhinitis: Secondary | ICD-10-CM | POA: Diagnosis not present

## 2014-07-24 DIAGNOSIS — M9903 Segmental and somatic dysfunction of lumbar region: Secondary | ICD-10-CM | POA: Diagnosis not present

## 2014-08-02 DIAGNOSIS — J3089 Other allergic rhinitis: Secondary | ICD-10-CM | POA: Diagnosis not present

## 2014-08-02 DIAGNOSIS — J301 Allergic rhinitis due to pollen: Secondary | ICD-10-CM | POA: Diagnosis not present

## 2014-08-10 DIAGNOSIS — J301 Allergic rhinitis due to pollen: Secondary | ICD-10-CM | POA: Diagnosis not present

## 2014-08-10 DIAGNOSIS — J3089 Other allergic rhinitis: Secondary | ICD-10-CM | POA: Diagnosis not present

## 2014-08-11 DIAGNOSIS — E780 Pure hypercholesterolemia: Secondary | ICD-10-CM | POA: Diagnosis not present

## 2014-08-11 DIAGNOSIS — E039 Hypothyroidism, unspecified: Secondary | ICD-10-CM | POA: Diagnosis not present

## 2014-08-11 DIAGNOSIS — G319 Degenerative disease of nervous system, unspecified: Secondary | ICD-10-CM | POA: Diagnosis not present

## 2014-08-11 DIAGNOSIS — R739 Hyperglycemia, unspecified: Secondary | ICD-10-CM | POA: Diagnosis not present

## 2014-08-11 DIAGNOSIS — I639 Cerebral infarction, unspecified: Secondary | ICD-10-CM | POA: Diagnosis not present

## 2014-08-11 DIAGNOSIS — I6502 Occlusion and stenosis of left vertebral artery: Secondary | ICD-10-CM | POA: Diagnosis not present

## 2014-08-11 DIAGNOSIS — F329 Major depressive disorder, single episode, unspecified: Secondary | ICD-10-CM | POA: Diagnosis not present

## 2014-08-11 DIAGNOSIS — Z79899 Other long term (current) drug therapy: Secondary | ICD-10-CM | POA: Diagnosis not present

## 2014-08-11 DIAGNOSIS — M2550 Pain in unspecified joint: Secondary | ICD-10-CM | POA: Diagnosis not present

## 2014-08-11 DIAGNOSIS — I6501 Occlusion and stenosis of right vertebral artery: Secondary | ICD-10-CM | POA: Diagnosis not present

## 2014-08-11 DIAGNOSIS — I6529 Occlusion and stenosis of unspecified carotid artery: Secondary | ICD-10-CM | POA: Diagnosis not present

## 2014-08-11 DIAGNOSIS — I10 Essential (primary) hypertension: Secondary | ICD-10-CM | POA: Diagnosis not present

## 2014-08-14 DIAGNOSIS — J3089 Other allergic rhinitis: Secondary | ICD-10-CM | POA: Diagnosis not present

## 2014-08-14 DIAGNOSIS — J301 Allergic rhinitis due to pollen: Secondary | ICD-10-CM | POA: Diagnosis not present

## 2014-08-15 DIAGNOSIS — R7989 Other specified abnormal findings of blood chemistry: Secondary | ICD-10-CM | POA: Diagnosis not present

## 2014-08-15 DIAGNOSIS — M130 Polyarthritis, unspecified: Secondary | ICD-10-CM | POA: Diagnosis not present

## 2014-08-15 DIAGNOSIS — D7589 Other specified diseases of blood and blood-forming organs: Secondary | ICD-10-CM | POA: Diagnosis not present

## 2014-08-22 DIAGNOSIS — J301 Allergic rhinitis due to pollen: Secondary | ICD-10-CM | POA: Diagnosis not present

## 2014-08-22 DIAGNOSIS — J3089 Other allergic rhinitis: Secondary | ICD-10-CM | POA: Diagnosis not present

## 2014-08-24 ENCOUNTER — Encounter: Payer: Self-pay | Admitting: *Deleted

## 2014-08-25 ENCOUNTER — Ambulatory Visit (INDEPENDENT_AMBULATORY_CARE_PROVIDER_SITE_OTHER): Payer: Medicare Other | Admitting: Neurology

## 2014-08-25 ENCOUNTER — Encounter: Payer: Self-pay | Admitting: Neurology

## 2014-08-25 VITALS — BP 127/72 | HR 65 | Ht 67.0 in | Wt 174.8 lb

## 2014-08-25 DIAGNOSIS — R1310 Dysphagia, unspecified: Secondary | ICD-10-CM | POA: Diagnosis not present

## 2014-08-25 DIAGNOSIS — I6502 Occlusion and stenosis of left vertebral artery: Secondary | ICD-10-CM | POA: Diagnosis not present

## 2014-08-25 DIAGNOSIS — E785 Hyperlipidemia, unspecified: Secondary | ICD-10-CM | POA: Diagnosis not present

## 2014-08-25 DIAGNOSIS — I63012 Cerebral infarction due to thrombosis of left vertebral artery: Secondary | ICD-10-CM | POA: Diagnosis not present

## 2014-08-25 DIAGNOSIS — I639 Cerebral infarction, unspecified: Secondary | ICD-10-CM

## 2014-08-25 NOTE — Progress Notes (Signed)
STROKE NEUROLOGY FOLLOW UP NOTE  NAME: Javell Blackburn DOB: 02/28/1946  REASON FOR VISIT: stroke follow up HISTORY FROM: pt and chart  Today we had the pleasure of seeing Mang Hazelrigg in follow-up at our [facility]rology [facility]. Pt was accompanied by no one.   History Summary Skippy Marhefka is an 69 y.o. male with history of HTN, HLD, thyroid disease and depression was admitted on 07/01/2014 due to acute onset of slurred speech and right sided motor and sensory deficits as well as difficulty swallowing. Denies any history of CVA or TIA. 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 His symptoms rapidly improving, and right-sided weakness resolved. However he still has some difficulty with swallowing and hoarseness. LDL 134. He was put on dural anti-platelet and Lipitor on discharge.  Interval History During the interval time, the patient has been doing well.  He still complains of some right-sided arm and leg numbness, but no weakness. Swallowing and the hoarseness resolved. He was seen by vascular surgery for left vertebral artery occlusion, recommended medical therapy. He has been on dual antiplatelets for 2 months now without side effect. His blood pressure 127/72 in clinic today.  REVIEW OF SYSTEMS: Full 14 system review of systems performed and notable only for those listed below and in HPI above, all others are negative:  Constitutional:   Cardiovascular: Swelling in legs Ear/Nose/Throat:  Trouble swallowing Skin:  Eyes:   Respiratory:   Gastroitestinal:   Genitourinary:  Hematology/Lymphatic:   Endocrine:  Musculoskeletal:   Joint pain, joint swelling Allergy/Immunology:  Allergies, runny nose Neurological:  Numbness, weakness, difficulty swallowing Psychiatric: Depression Sleep: Restless legs  The following represents the patient's updated allergies and side effects  list: No Known Allergies  The neurologically relevant items on the patient's problem list were reviewed on today's visit.  Neurologic Examination  A problem focused neurological exam (12 or more points of the single system neurologic examination, vital signs counts as 1 point, cranial nerves count for 8 points) was performed.  Blood pressure 127/72, pulse 65, height 5\' 7"  (1.702 m), weight 174 lb 12.8 oz (79.289 kg).  General - Well nourished, well developed, in no apparent distress.  Ophthalmologic - Sharp disc margins OU.  Cardiovascular - Regular rate and rhythm with no murmur.  Mental Status -  Level of arousal and orientation to time, place, and person were intact. Language including expression, naming, repetition, comprehension was assessed and found intact. Attention span and concentration were normal. Recent and remote memory were intact. Fund of Knowledge was assessed and was intact.  Cranial Nerves II - XII - II - Visual field intact OU. III, IV, VI - Extraocular movements intact. V - Facial sensation intact bilaterally. VII - Facial movement intact bilaterally. VIII - Hearing & vestibular intact bilaterally. X - Palate elevates symmetrically. XI - Chin turning & shoulder shrug intact bilaterally. XII - Tongue protrusion intact.  Motor Strength - The patient's strength was normal in all extremities and pronator drift was absent.  Bulk was normal and fasciculations were absent.   Motor Tone - Muscle tone was assessed at the neck and appendages and was normal.  Reflexes - The patient's reflexes were 1+ in all extremities and he had no pathological reflexes.  Sensory - Light touch, temperature/pinprick were assessed and were decreased on the right.    Coordination - The patient had normal movements in the hands and feet with no ataxia or  dysmetria.  Tremor was absent.  Gait and Station - The patient's transfers, posture, gait, station, and turns were observed as  normal.  Data reviewed: I personally reviewed the images and agree with the radiology interpretations.  Dg Chest 2 View 07/02/2014 No active cardiopulmonary disease.   Ct Head Wo Contrast 07/01/2014 No acute intracranial abnormalities. Mild chronic atrophy and small vessel ischemic changes.   MRI HEAD  07/02/2014 1. Linear 11 mm hyperintense focus on DWI sequence within the left lateral medulla as above, suspicious for possible small acute ischemic infarct. Correlation with symptomatology for possible acute ischemic infarct at this location recommended. 2. Remote lacunar infarcts involving the bilateral thalami. 3. Age-related cerebral atrophy.   MRA HEAD  07/02/2014 1. 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 There is some flow within the distal left vertebral artery prior to vertebrobasilar junction via collateral flow from the right vertebral artery system. 2. Short-segment severe stenosis within the mid left P2 segment. 3. Mild multi focal atheromatous irregularity within the cavernous ICAs bilaterally with associated mild multi focal narrowing. 4. Mild distal branch atheromatous irregularity within the MCA and PCA branches bilaterally.   CTA neck 07/03/2014 1. 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 This more resembles sequelae of atherosclerotic disease than a focal dissection. There is also high-grade stenosis at the left vertebral artery origin due to soft plaque. 2. Mild right vertebral artery origin stenosis. Bilateral carotid bifurcation plaque without hemodynamically significant cervical carotid stenosis. 3. 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 No neck mass or lymphadenopathy identified.  2D echo - Left ventricle: The cavity size was normal. Systolic function was normal. The estimated ejection fraction was in the range of 55% to 60%. Wall motion was normal; there were no regional  wall motion abnormalities. Left ventricular diastolic function parameters were normal. - Left atrium: The atrium was mildly dilated. - Atrial septum: No defect or patent foramen ovale was identified.  Component     Latest Ref Rng 07/02/2014  Cholesterol     0 - 200 mg/dL 210 (H)  Triglycerides     <150 mg/dL 160 (H)  HDL Cholesterol     >39 mg/dL 44  Total CHOL/HDL Ratio      4.8  VLDL     0 - 40 mg/dL 32  LDL (calc)     0 - 99 mg/dL 134 (H)  Hemoglobin A1C     4.8 - 5.6 % 6.0 (H)  Mean Plasma Glucose      126    Assessment: As you may recall, he is a 69 y.o. Caucasian male with PMH of HTN, HLD, hypothyroidism, depression was admitted on 07/01/2014 due to left acute lateral medullary infarct. MRA showed left vertebral artery occlusion as well as diffuse intracranial atherosclerosis. LDL 134. He was put on dural antiplatelets and Lipitor for stroke prevention. Symptoms resolved except right side arm and leg decreased sensation.  Plan:  - continue ASA and plavix for total 3 months and then plavix alone - continue lipitor for stroke prevention  - Follow up with your primary care physician for stroke risk factor modification. Recommend maintain blood pressure goal <130/80, diabetes with hemoglobin A1c goal below 6.5% and lipids with LDL cholesterol goal below 70 mg/dL.  - check BP at home - RTC in 4 months.  No orders of the defined types were placed in this encounter.    Meds ordered this encounter  Medications  .  PROVENTIL HFA 108 (90 BASE) MCG/ACT inhaler    Sig:     Refill:  0    Patient Instructions  - continue ASA and plavix for another one month and then plavix alone - continue lipitor for stroke prevention  - Follow up with your primary care physician for stroke risk factor modification. Recommend maintain blood pressure goal <130/80, diabetes with hemoglobin A1c goal below 6.5% and lipids with LDL cholesterol goal below 70 mg/dL.  - check BP at home - follow  up in 4 months.   Rosalin Hawking, MD PhD [facility] [address] [place], [place] [address] [phone]

## 2014-08-25 NOTE — Patient Instructions (Addendum)
-   continue ASA and plavix for another one month and then plavix alone - continue lipitor for stroke prevention  - Follow up with your primary care physician for stroke risk factor modification. Recommend maintain blood pressure goal <130/80, diabetes with hemoglobin A1c goal below 6.5% and lipids with LDL cholesterol goal below 70 mg/dL.  - check BP at home - follow up in 4 months.

## 2014-08-28 DIAGNOSIS — M9902 Segmental and somatic dysfunction of thoracic region: Secondary | ICD-10-CM | POA: Diagnosis not present

## 2014-08-28 DIAGNOSIS — J3089 Other allergic rhinitis: Secondary | ICD-10-CM | POA: Diagnosis not present

## 2014-08-28 DIAGNOSIS — M542 Cervicalgia: Secondary | ICD-10-CM | POA: Diagnosis not present

## 2014-08-28 DIAGNOSIS — J3081 Allergic rhinitis due to animal (cat) (dog) hair and dander: Secondary | ICD-10-CM | POA: Diagnosis not present

## 2014-08-28 DIAGNOSIS — J301 Allergic rhinitis due to pollen: Secondary | ICD-10-CM | POA: Diagnosis not present

## 2014-08-28 DIAGNOSIS — M9903 Segmental and somatic dysfunction of lumbar region: Secondary | ICD-10-CM | POA: Diagnosis not present

## 2014-08-28 DIAGNOSIS — M9901 Segmental and somatic dysfunction of cervical region: Secondary | ICD-10-CM | POA: Diagnosis not present

## 2014-09-04 DIAGNOSIS — J3089 Other allergic rhinitis: Secondary | ICD-10-CM | POA: Diagnosis not present

## 2014-09-04 DIAGNOSIS — J301 Allergic rhinitis due to pollen: Secondary | ICD-10-CM | POA: Diagnosis not present

## 2014-09-04 DIAGNOSIS — J3081 Allergic rhinitis due to animal (cat) (dog) hair and dander: Secondary | ICD-10-CM | POA: Diagnosis not present

## 2014-09-11 DIAGNOSIS — J3081 Allergic rhinitis due to animal (cat) (dog) hair and dander: Secondary | ICD-10-CM | POA: Diagnosis not present

## 2014-09-11 DIAGNOSIS — J301 Allergic rhinitis due to pollen: Secondary | ICD-10-CM | POA: Diagnosis not present

## 2014-09-11 DIAGNOSIS — J3089 Other allergic rhinitis: Secondary | ICD-10-CM | POA: Diagnosis not present

## 2014-09-12 DIAGNOSIS — M79609 Pain in unspecified limb: Secondary | ICD-10-CM | POA: Diagnosis not present

## 2014-09-12 DIAGNOSIS — I10 Essential (primary) hypertension: Secondary | ICD-10-CM | POA: Diagnosis not present

## 2014-09-12 DIAGNOSIS — I639 Cerebral infarction, unspecified: Secondary | ICD-10-CM | POA: Diagnosis not present

## 2014-09-18 DIAGNOSIS — J3089 Other allergic rhinitis: Secondary | ICD-10-CM | POA: Diagnosis not present

## 2014-09-18 DIAGNOSIS — J301 Allergic rhinitis due to pollen: Secondary | ICD-10-CM | POA: Diagnosis not present

## 2014-09-26 DIAGNOSIS — M9902 Segmental and somatic dysfunction of thoracic region: Secondary | ICD-10-CM | POA: Diagnosis not present

## 2014-09-26 DIAGNOSIS — M542 Cervicalgia: Secondary | ICD-10-CM | POA: Diagnosis not present

## 2014-09-26 DIAGNOSIS — J3081 Allergic rhinitis due to animal (cat) (dog) hair and dander: Secondary | ICD-10-CM | POA: Diagnosis not present

## 2014-09-26 DIAGNOSIS — M9903 Segmental and somatic dysfunction of lumbar region: Secondary | ICD-10-CM | POA: Diagnosis not present

## 2014-09-26 DIAGNOSIS — M9901 Segmental and somatic dysfunction of cervical region: Secondary | ICD-10-CM | POA: Diagnosis not present

## 2014-09-26 DIAGNOSIS — J301 Allergic rhinitis due to pollen: Secondary | ICD-10-CM | POA: Diagnosis not present

## 2014-09-26 DIAGNOSIS — J3089 Other allergic rhinitis: Secondary | ICD-10-CM | POA: Diagnosis not present

## 2014-10-03 DIAGNOSIS — J301 Allergic rhinitis due to pollen: Secondary | ICD-10-CM | POA: Diagnosis not present

## 2014-10-03 DIAGNOSIS — J3089 Other allergic rhinitis: Secondary | ICD-10-CM | POA: Diagnosis not present

## 2014-10-03 DIAGNOSIS — I10 Essential (primary) hypertension: Secondary | ICD-10-CM | POA: Diagnosis not present

## 2014-10-03 DIAGNOSIS — I639 Cerebral infarction, unspecified: Secondary | ICD-10-CM | POA: Diagnosis not present

## 2014-10-03 DIAGNOSIS — J3081 Allergic rhinitis due to animal (cat) (dog) hair and dander: Secondary | ICD-10-CM | POA: Diagnosis not present

## 2014-10-05 ENCOUNTER — Ambulatory Visit: Payer: Self-pay | Admitting: Neurology

## 2014-10-18 DIAGNOSIS — J3089 Other allergic rhinitis: Secondary | ICD-10-CM | POA: Diagnosis not present

## 2014-10-18 DIAGNOSIS — J301 Allergic rhinitis due to pollen: Secondary | ICD-10-CM | POA: Diagnosis not present

## 2014-10-18 DIAGNOSIS — J3081 Allergic rhinitis due to animal (cat) (dog) hair and dander: Secondary | ICD-10-CM | POA: Diagnosis not present

## 2014-10-20 DIAGNOSIS — J3089 Other allergic rhinitis: Secondary | ICD-10-CM | POA: Diagnosis not present

## 2014-10-20 DIAGNOSIS — J301 Allergic rhinitis due to pollen: Secondary | ICD-10-CM | POA: Diagnosis not present

## 2014-10-20 DIAGNOSIS — J3081 Allergic rhinitis due to animal (cat) (dog) hair and dander: Secondary | ICD-10-CM | POA: Diagnosis not present

## 2014-10-23 DIAGNOSIS — J301 Allergic rhinitis due to pollen: Secondary | ICD-10-CM | POA: Diagnosis not present

## 2014-10-23 DIAGNOSIS — J3089 Other allergic rhinitis: Secondary | ICD-10-CM | POA: Diagnosis not present

## 2014-10-23 DIAGNOSIS — J3081 Allergic rhinitis due to animal (cat) (dog) hair and dander: Secondary | ICD-10-CM | POA: Diagnosis not present

## 2014-10-26 DIAGNOSIS — J3089 Other allergic rhinitis: Secondary | ICD-10-CM | POA: Diagnosis not present

## 2014-10-26 DIAGNOSIS — J301 Allergic rhinitis due to pollen: Secondary | ICD-10-CM | POA: Diagnosis not present

## 2014-10-31 DIAGNOSIS — M9901 Segmental and somatic dysfunction of cervical region: Secondary | ICD-10-CM | POA: Diagnosis not present

## 2014-10-31 DIAGNOSIS — J3081 Allergic rhinitis due to animal (cat) (dog) hair and dander: Secondary | ICD-10-CM | POA: Diagnosis not present

## 2014-10-31 DIAGNOSIS — J3089 Other allergic rhinitis: Secondary | ICD-10-CM | POA: Diagnosis not present

## 2014-10-31 DIAGNOSIS — J301 Allergic rhinitis due to pollen: Secondary | ICD-10-CM | POA: Diagnosis not present

## 2014-10-31 DIAGNOSIS — M9902 Segmental and somatic dysfunction of thoracic region: Secondary | ICD-10-CM | POA: Diagnosis not present

## 2014-10-31 DIAGNOSIS — M9903 Segmental and somatic dysfunction of lumbar region: Secondary | ICD-10-CM | POA: Diagnosis not present

## 2014-10-31 DIAGNOSIS — M542 Cervicalgia: Secondary | ICD-10-CM | POA: Diagnosis not present

## 2014-11-03 DIAGNOSIS — G629 Polyneuropathy, unspecified: Secondary | ICD-10-CM | POA: Diagnosis not present

## 2014-11-03 DIAGNOSIS — E780 Pure hypercholesterolemia: Secondary | ICD-10-CM | POA: Diagnosis not present

## 2014-11-03 DIAGNOSIS — I10 Essential (primary) hypertension: Secondary | ICD-10-CM | POA: Diagnosis not present

## 2014-11-03 DIAGNOSIS — Z79899 Other long term (current) drug therapy: Secondary | ICD-10-CM | POA: Diagnosis not present

## 2014-11-03 DIAGNOSIS — E039 Hypothyroidism, unspecified: Secondary | ICD-10-CM | POA: Diagnosis not present

## 2014-11-06 DIAGNOSIS — J3089 Other allergic rhinitis: Secondary | ICD-10-CM | POA: Diagnosis not present

## 2014-11-06 DIAGNOSIS — J3081 Allergic rhinitis due to animal (cat) (dog) hair and dander: Secondary | ICD-10-CM | POA: Diagnosis not present

## 2014-11-06 DIAGNOSIS — J301 Allergic rhinitis due to pollen: Secondary | ICD-10-CM | POA: Diagnosis not present

## 2014-11-15 DIAGNOSIS — J3089 Other allergic rhinitis: Secondary | ICD-10-CM | POA: Diagnosis not present

## 2014-11-15 DIAGNOSIS — J301 Allergic rhinitis due to pollen: Secondary | ICD-10-CM | POA: Diagnosis not present

## 2014-11-15 DIAGNOSIS — J3081 Allergic rhinitis due to animal (cat) (dog) hair and dander: Secondary | ICD-10-CM | POA: Diagnosis not present

## 2014-11-20 DIAGNOSIS — J3081 Allergic rhinitis due to animal (cat) (dog) hair and dander: Secondary | ICD-10-CM | POA: Diagnosis not present

## 2014-11-20 DIAGNOSIS — J301 Allergic rhinitis due to pollen: Secondary | ICD-10-CM | POA: Diagnosis not present

## 2014-11-20 DIAGNOSIS — J3089 Other allergic rhinitis: Secondary | ICD-10-CM | POA: Diagnosis not present

## 2014-11-28 DIAGNOSIS — J301 Allergic rhinitis due to pollen: Secondary | ICD-10-CM | POA: Diagnosis not present

## 2014-11-28 DIAGNOSIS — M542 Cervicalgia: Secondary | ICD-10-CM | POA: Diagnosis not present

## 2014-11-28 DIAGNOSIS — J3081 Allergic rhinitis due to animal (cat) (dog) hair and dander: Secondary | ICD-10-CM | POA: Diagnosis not present

## 2014-11-28 DIAGNOSIS — M9903 Segmental and somatic dysfunction of lumbar region: Secondary | ICD-10-CM | POA: Diagnosis not present

## 2014-11-28 DIAGNOSIS — J3089 Other allergic rhinitis: Secondary | ICD-10-CM | POA: Diagnosis not present

## 2014-11-28 DIAGNOSIS — M9901 Segmental and somatic dysfunction of cervical region: Secondary | ICD-10-CM | POA: Diagnosis not present

## 2014-11-28 DIAGNOSIS — M9902 Segmental and somatic dysfunction of thoracic region: Secondary | ICD-10-CM | POA: Diagnosis not present

## 2014-12-05 DIAGNOSIS — J3089 Other allergic rhinitis: Secondary | ICD-10-CM | POA: Diagnosis not present

## 2014-12-05 DIAGNOSIS — J3081 Allergic rhinitis due to animal (cat) (dog) hair and dander: Secondary | ICD-10-CM | POA: Diagnosis not present

## 2014-12-05 DIAGNOSIS — J301 Allergic rhinitis due to pollen: Secondary | ICD-10-CM | POA: Diagnosis not present

## 2014-12-11 DIAGNOSIS — J301 Allergic rhinitis due to pollen: Secondary | ICD-10-CM | POA: Diagnosis not present

## 2014-12-11 DIAGNOSIS — J3089 Other allergic rhinitis: Secondary | ICD-10-CM | POA: Diagnosis not present

## 2014-12-11 DIAGNOSIS — J3081 Allergic rhinitis due to animal (cat) (dog) hair and dander: Secondary | ICD-10-CM | POA: Diagnosis not present

## 2014-12-11 DIAGNOSIS — H1045 Other chronic allergic conjunctivitis: Secondary | ICD-10-CM | POA: Diagnosis not present

## 2014-12-18 DIAGNOSIS — J301 Allergic rhinitis due to pollen: Secondary | ICD-10-CM | POA: Diagnosis not present

## 2014-12-18 DIAGNOSIS — J3081 Allergic rhinitis due to animal (cat) (dog) hair and dander: Secondary | ICD-10-CM | POA: Diagnosis not present

## 2014-12-18 DIAGNOSIS — J3089 Other allergic rhinitis: Secondary | ICD-10-CM | POA: Diagnosis not present

## 2014-12-19 DIAGNOSIS — Z23 Encounter for immunization: Secondary | ICD-10-CM | POA: Diagnosis not present

## 2014-12-25 ENCOUNTER — Ambulatory Visit: Payer: Medicare Other | Admitting: Neurology

## 2014-12-26 DIAGNOSIS — J301 Allergic rhinitis due to pollen: Secondary | ICD-10-CM | POA: Diagnosis not present

## 2014-12-26 DIAGNOSIS — M9902 Segmental and somatic dysfunction of thoracic region: Secondary | ICD-10-CM | POA: Diagnosis not present

## 2014-12-26 DIAGNOSIS — J3081 Allergic rhinitis due to animal (cat) (dog) hair and dander: Secondary | ICD-10-CM | POA: Diagnosis not present

## 2014-12-26 DIAGNOSIS — J3089 Other allergic rhinitis: Secondary | ICD-10-CM | POA: Diagnosis not present

## 2014-12-26 DIAGNOSIS — M9903 Segmental and somatic dysfunction of lumbar region: Secondary | ICD-10-CM | POA: Diagnosis not present

## 2014-12-26 DIAGNOSIS — M9901 Segmental and somatic dysfunction of cervical region: Secondary | ICD-10-CM | POA: Diagnosis not present

## 2014-12-26 DIAGNOSIS — M542 Cervicalgia: Secondary | ICD-10-CM | POA: Diagnosis not present

## 2014-12-29 DIAGNOSIS — J3081 Allergic rhinitis due to animal (cat) (dog) hair and dander: Secondary | ICD-10-CM | POA: Diagnosis not present

## 2015-01-08 DIAGNOSIS — J301 Allergic rhinitis due to pollen: Secondary | ICD-10-CM | POA: Diagnosis not present

## 2015-01-08 DIAGNOSIS — J3081 Allergic rhinitis due to animal (cat) (dog) hair and dander: Secondary | ICD-10-CM | POA: Diagnosis not present

## 2015-01-08 DIAGNOSIS — J3089 Other allergic rhinitis: Secondary | ICD-10-CM | POA: Diagnosis not present

## 2015-01-15 DIAGNOSIS — J3089 Other allergic rhinitis: Secondary | ICD-10-CM | POA: Diagnosis not present

## 2015-01-15 DIAGNOSIS — J3081 Allergic rhinitis due to animal (cat) (dog) hair and dander: Secondary | ICD-10-CM | POA: Diagnosis not present

## 2015-01-15 DIAGNOSIS — J301 Allergic rhinitis due to pollen: Secondary | ICD-10-CM | POA: Diagnosis not present

## 2015-01-23 DIAGNOSIS — M9902 Segmental and somatic dysfunction of thoracic region: Secondary | ICD-10-CM | POA: Diagnosis not present

## 2015-01-23 DIAGNOSIS — J301 Allergic rhinitis due to pollen: Secondary | ICD-10-CM | POA: Diagnosis not present

## 2015-01-23 DIAGNOSIS — M9901 Segmental and somatic dysfunction of cervical region: Secondary | ICD-10-CM | POA: Diagnosis not present

## 2015-01-23 DIAGNOSIS — M9903 Segmental and somatic dysfunction of lumbar region: Secondary | ICD-10-CM | POA: Diagnosis not present

## 2015-01-23 DIAGNOSIS — J3081 Allergic rhinitis due to animal (cat) (dog) hair and dander: Secondary | ICD-10-CM | POA: Diagnosis not present

## 2015-01-23 DIAGNOSIS — M542 Cervicalgia: Secondary | ICD-10-CM | POA: Diagnosis not present

## 2015-01-23 DIAGNOSIS — J3089 Other allergic rhinitis: Secondary | ICD-10-CM | POA: Diagnosis not present

## 2015-01-29 DIAGNOSIS — J3081 Allergic rhinitis due to animal (cat) (dog) hair and dander: Secondary | ICD-10-CM | POA: Diagnosis not present

## 2015-01-29 DIAGNOSIS — J301 Allergic rhinitis due to pollen: Secondary | ICD-10-CM | POA: Diagnosis not present

## 2015-01-29 DIAGNOSIS — J3089 Other allergic rhinitis: Secondary | ICD-10-CM | POA: Diagnosis not present

## 2015-02-05 DIAGNOSIS — J301 Allergic rhinitis due to pollen: Secondary | ICD-10-CM | POA: Diagnosis not present

## 2015-02-05 DIAGNOSIS — J3089 Other allergic rhinitis: Secondary | ICD-10-CM | POA: Diagnosis not present

## 2015-02-05 DIAGNOSIS — J3081 Allergic rhinitis due to animal (cat) (dog) hair and dander: Secondary | ICD-10-CM | POA: Diagnosis not present

## 2015-02-12 DIAGNOSIS — J301 Allergic rhinitis due to pollen: Secondary | ICD-10-CM | POA: Diagnosis not present

## 2015-02-12 DIAGNOSIS — J3089 Other allergic rhinitis: Secondary | ICD-10-CM | POA: Diagnosis not present

## 2015-02-12 DIAGNOSIS — J3081 Allergic rhinitis due to animal (cat) (dog) hair and dander: Secondary | ICD-10-CM | POA: Diagnosis not present

## 2015-02-13 DIAGNOSIS — I10 Essential (primary) hypertension: Secondary | ICD-10-CM | POA: Diagnosis not present

## 2015-02-19 DIAGNOSIS — J301 Allergic rhinitis due to pollen: Secondary | ICD-10-CM | POA: Diagnosis not present

## 2015-02-19 DIAGNOSIS — J3089 Other allergic rhinitis: Secondary | ICD-10-CM | POA: Diagnosis not present

## 2015-02-19 DIAGNOSIS — J3081 Allergic rhinitis due to animal (cat) (dog) hair and dander: Secondary | ICD-10-CM | POA: Diagnosis not present

## 2015-02-20 DIAGNOSIS — M6281 Muscle weakness (generalized): Secondary | ICD-10-CM | POA: Diagnosis not present

## 2015-02-20 DIAGNOSIS — R262 Difficulty in walking, not elsewhere classified: Secondary | ICD-10-CM | POA: Diagnosis not present

## 2015-02-23 DIAGNOSIS — R262 Difficulty in walking, not elsewhere classified: Secondary | ICD-10-CM | POA: Diagnosis not present

## 2015-02-23 DIAGNOSIS — M6281 Muscle weakness (generalized): Secondary | ICD-10-CM | POA: Diagnosis not present

## 2015-02-27 DIAGNOSIS — J3089 Other allergic rhinitis: Secondary | ICD-10-CM | POA: Diagnosis not present

## 2015-02-27 DIAGNOSIS — M9901 Segmental and somatic dysfunction of cervical region: Secondary | ICD-10-CM | POA: Diagnosis not present

## 2015-02-27 DIAGNOSIS — M6281 Muscle weakness (generalized): Secondary | ICD-10-CM | POA: Diagnosis not present

## 2015-02-27 DIAGNOSIS — M9903 Segmental and somatic dysfunction of lumbar region: Secondary | ICD-10-CM | POA: Diagnosis not present

## 2015-02-27 DIAGNOSIS — R262 Difficulty in walking, not elsewhere classified: Secondary | ICD-10-CM | POA: Diagnosis not present

## 2015-02-27 DIAGNOSIS — J301 Allergic rhinitis due to pollen: Secondary | ICD-10-CM | POA: Diagnosis not present

## 2015-02-27 DIAGNOSIS — M9902 Segmental and somatic dysfunction of thoracic region: Secondary | ICD-10-CM | POA: Diagnosis not present

## 2015-02-27 DIAGNOSIS — M542 Cervicalgia: Secondary | ICD-10-CM | POA: Diagnosis not present

## 2015-02-27 DIAGNOSIS — J3081 Allergic rhinitis due to animal (cat) (dog) hair and dander: Secondary | ICD-10-CM | POA: Diagnosis not present

## 2015-03-02 DIAGNOSIS — M6281 Muscle weakness (generalized): Secondary | ICD-10-CM | POA: Diagnosis not present

## 2015-03-02 DIAGNOSIS — R262 Difficulty in walking, not elsewhere classified: Secondary | ICD-10-CM | POA: Diagnosis not present

## 2015-03-05 DIAGNOSIS — J3089 Other allergic rhinitis: Secondary | ICD-10-CM | POA: Diagnosis not present

## 2015-03-05 DIAGNOSIS — J301 Allergic rhinitis due to pollen: Secondary | ICD-10-CM | POA: Diagnosis not present

## 2015-03-06 DIAGNOSIS — R262 Difficulty in walking, not elsewhere classified: Secondary | ICD-10-CM | POA: Diagnosis not present

## 2015-03-06 DIAGNOSIS — M6281 Muscle weakness (generalized): Secondary | ICD-10-CM | POA: Diagnosis not present

## 2015-03-09 DIAGNOSIS — M6281 Muscle weakness (generalized): Secondary | ICD-10-CM | POA: Diagnosis not present

## 2015-03-09 DIAGNOSIS — R262 Difficulty in walking, not elsewhere classified: Secondary | ICD-10-CM | POA: Diagnosis not present

## 2015-03-12 DIAGNOSIS — J301 Allergic rhinitis due to pollen: Secondary | ICD-10-CM | POA: Diagnosis not present

## 2015-03-12 DIAGNOSIS — J3081 Allergic rhinitis due to animal (cat) (dog) hair and dander: Secondary | ICD-10-CM | POA: Diagnosis not present

## 2015-03-12 DIAGNOSIS — J3089 Other allergic rhinitis: Secondary | ICD-10-CM | POA: Diagnosis not present

## 2015-03-13 DIAGNOSIS — M6281 Muscle weakness (generalized): Secondary | ICD-10-CM | POA: Diagnosis not present

## 2015-03-13 DIAGNOSIS — R262 Difficulty in walking, not elsewhere classified: Secondary | ICD-10-CM | POA: Diagnosis not present

## 2015-03-22 DIAGNOSIS — J3089 Other allergic rhinitis: Secondary | ICD-10-CM | POA: Diagnosis not present

## 2015-03-22 DIAGNOSIS — J301 Allergic rhinitis due to pollen: Secondary | ICD-10-CM | POA: Diagnosis not present

## 2015-03-22 DIAGNOSIS — J3081 Allergic rhinitis due to animal (cat) (dog) hair and dander: Secondary | ICD-10-CM | POA: Diagnosis not present

## 2015-03-23 DIAGNOSIS — R262 Difficulty in walking, not elsewhere classified: Secondary | ICD-10-CM | POA: Diagnosis not present

## 2015-03-23 DIAGNOSIS — M6281 Muscle weakness (generalized): Secondary | ICD-10-CM | POA: Diagnosis not present

## 2015-03-27 DIAGNOSIS — J301 Allergic rhinitis due to pollen: Secondary | ICD-10-CM | POA: Diagnosis not present

## 2015-03-27 DIAGNOSIS — J3089 Other allergic rhinitis: Secondary | ICD-10-CM | POA: Diagnosis not present

## 2015-03-27 DIAGNOSIS — M9902 Segmental and somatic dysfunction of thoracic region: Secondary | ICD-10-CM | POA: Diagnosis not present

## 2015-03-27 DIAGNOSIS — M9901 Segmental and somatic dysfunction of cervical region: Secondary | ICD-10-CM | POA: Diagnosis not present

## 2015-03-27 DIAGNOSIS — M9903 Segmental and somatic dysfunction of lumbar region: Secondary | ICD-10-CM | POA: Diagnosis not present

## 2015-03-27 DIAGNOSIS — M542 Cervicalgia: Secondary | ICD-10-CM | POA: Diagnosis not present

## 2015-03-27 DIAGNOSIS — J3081 Allergic rhinitis due to animal (cat) (dog) hair and dander: Secondary | ICD-10-CM | POA: Diagnosis not present

## 2015-03-27 DIAGNOSIS — M6281 Muscle weakness (generalized): Secondary | ICD-10-CM | POA: Diagnosis not present

## 2015-03-27 DIAGNOSIS — R262 Difficulty in walking, not elsewhere classified: Secondary | ICD-10-CM | POA: Diagnosis not present

## 2015-03-30 DIAGNOSIS — J329 Chronic sinusitis, unspecified: Secondary | ICD-10-CM | POA: Diagnosis not present

## 2015-04-02 DIAGNOSIS — J301 Allergic rhinitis due to pollen: Secondary | ICD-10-CM | POA: Diagnosis not present

## 2015-04-02 DIAGNOSIS — J3089 Other allergic rhinitis: Secondary | ICD-10-CM | POA: Diagnosis not present

## 2015-04-02 DIAGNOSIS — J3081 Allergic rhinitis due to animal (cat) (dog) hair and dander: Secondary | ICD-10-CM | POA: Diagnosis not present

## 2015-04-02 DIAGNOSIS — L570 Actinic keratosis: Secondary | ICD-10-CM | POA: Diagnosis not present

## 2015-04-02 DIAGNOSIS — C44310 Basal cell carcinoma of skin of unspecified parts of face: Secondary | ICD-10-CM | POA: Diagnosis not present

## 2015-04-02 DIAGNOSIS — C44319 Basal cell carcinoma of skin of other parts of face: Secondary | ICD-10-CM | POA: Diagnosis not present

## 2015-04-05 DIAGNOSIS — J3089 Other allergic rhinitis: Secondary | ICD-10-CM | POA: Diagnosis not present

## 2015-04-05 DIAGNOSIS — J301 Allergic rhinitis due to pollen: Secondary | ICD-10-CM | POA: Diagnosis not present

## 2015-04-05 DIAGNOSIS — J3081 Allergic rhinitis due to animal (cat) (dog) hair and dander: Secondary | ICD-10-CM | POA: Diagnosis not present

## 2015-04-10 DIAGNOSIS — J301 Allergic rhinitis due to pollen: Secondary | ICD-10-CM | POA: Diagnosis not present

## 2015-04-10 DIAGNOSIS — J3081 Allergic rhinitis due to animal (cat) (dog) hair and dander: Secondary | ICD-10-CM | POA: Diagnosis not present

## 2015-04-10 DIAGNOSIS — J3089 Other allergic rhinitis: Secondary | ICD-10-CM | POA: Diagnosis not present

## 2015-04-16 DIAGNOSIS — J3081 Allergic rhinitis due to animal (cat) (dog) hair and dander: Secondary | ICD-10-CM | POA: Diagnosis not present

## 2015-04-16 DIAGNOSIS — J3089 Other allergic rhinitis: Secondary | ICD-10-CM | POA: Diagnosis not present

## 2015-04-16 DIAGNOSIS — J301 Allergic rhinitis due to pollen: Secondary | ICD-10-CM | POA: Diagnosis not present

## 2015-04-23 DIAGNOSIS — J301 Allergic rhinitis due to pollen: Secondary | ICD-10-CM | POA: Diagnosis not present

## 2015-04-23 DIAGNOSIS — J3089 Other allergic rhinitis: Secondary | ICD-10-CM | POA: Diagnosis not present

## 2015-04-23 DIAGNOSIS — J3081 Allergic rhinitis due to animal (cat) (dog) hair and dander: Secondary | ICD-10-CM | POA: Diagnosis not present

## 2015-04-26 DIAGNOSIS — C44319 Basal cell carcinoma of skin of other parts of face: Secondary | ICD-10-CM | POA: Diagnosis not present

## 2015-05-01 DIAGNOSIS — J3081 Allergic rhinitis due to animal (cat) (dog) hair and dander: Secondary | ICD-10-CM | POA: Diagnosis not present

## 2015-05-01 DIAGNOSIS — J301 Allergic rhinitis due to pollen: Secondary | ICD-10-CM | POA: Diagnosis not present

## 2015-05-01 DIAGNOSIS — J3089 Other allergic rhinitis: Secondary | ICD-10-CM | POA: Diagnosis not present

## 2015-05-01 DIAGNOSIS — M9901 Segmental and somatic dysfunction of cervical region: Secondary | ICD-10-CM | POA: Diagnosis not present

## 2015-05-01 DIAGNOSIS — M9902 Segmental and somatic dysfunction of thoracic region: Secondary | ICD-10-CM | POA: Diagnosis not present

## 2015-05-01 DIAGNOSIS — M542 Cervicalgia: Secondary | ICD-10-CM | POA: Diagnosis not present

## 2015-05-01 DIAGNOSIS — M9903 Segmental and somatic dysfunction of lumbar region: Secondary | ICD-10-CM | POA: Diagnosis not present

## 2015-05-03 DIAGNOSIS — Z4802 Encounter for removal of sutures: Secondary | ICD-10-CM | POA: Diagnosis not present

## 2015-05-07 DIAGNOSIS — J301 Allergic rhinitis due to pollen: Secondary | ICD-10-CM | POA: Diagnosis not present

## 2015-05-07 DIAGNOSIS — J3089 Other allergic rhinitis: Secondary | ICD-10-CM | POA: Diagnosis not present

## 2015-05-07 DIAGNOSIS — J3081 Allergic rhinitis due to animal (cat) (dog) hair and dander: Secondary | ICD-10-CM | POA: Diagnosis not present

## 2015-05-14 DIAGNOSIS — J301 Allergic rhinitis due to pollen: Secondary | ICD-10-CM | POA: Diagnosis not present

## 2015-05-14 DIAGNOSIS — J3081 Allergic rhinitis due to animal (cat) (dog) hair and dander: Secondary | ICD-10-CM | POA: Diagnosis not present

## 2015-05-14 DIAGNOSIS — J3089 Other allergic rhinitis: Secondary | ICD-10-CM | POA: Diagnosis not present

## 2015-05-21 DIAGNOSIS — J3089 Other allergic rhinitis: Secondary | ICD-10-CM | POA: Diagnosis not present

## 2015-05-21 DIAGNOSIS — J3081 Allergic rhinitis due to animal (cat) (dog) hair and dander: Secondary | ICD-10-CM | POA: Diagnosis not present

## 2015-05-21 DIAGNOSIS — J301 Allergic rhinitis due to pollen: Secondary | ICD-10-CM | POA: Diagnosis not present

## 2015-05-23 DIAGNOSIS — E785 Hyperlipidemia, unspecified: Secondary | ICD-10-CM | POA: Diagnosis not present

## 2015-05-23 DIAGNOSIS — E039 Hypothyroidism, unspecified: Secondary | ICD-10-CM | POA: Diagnosis not present

## 2015-05-23 DIAGNOSIS — R0982 Postnasal drip: Secondary | ICD-10-CM | POA: Diagnosis not present

## 2015-05-23 DIAGNOSIS — I10 Essential (primary) hypertension: Secondary | ICD-10-CM | POA: Diagnosis not present

## 2015-05-23 DIAGNOSIS — I679 Cerebrovascular disease, unspecified: Secondary | ICD-10-CM | POA: Diagnosis not present

## 2015-05-28 DIAGNOSIS — J3089 Other allergic rhinitis: Secondary | ICD-10-CM | POA: Diagnosis not present

## 2015-05-28 DIAGNOSIS — J301 Allergic rhinitis due to pollen: Secondary | ICD-10-CM | POA: Diagnosis not present

## 2015-05-28 DIAGNOSIS — J3081 Allergic rhinitis due to animal (cat) (dog) hair and dander: Secondary | ICD-10-CM | POA: Diagnosis not present

## 2015-05-29 DIAGNOSIS — M542 Cervicalgia: Secondary | ICD-10-CM | POA: Diagnosis not present

## 2015-05-29 DIAGNOSIS — M9903 Segmental and somatic dysfunction of lumbar region: Secondary | ICD-10-CM | POA: Diagnosis not present

## 2015-05-29 DIAGNOSIS — M9901 Segmental and somatic dysfunction of cervical region: Secondary | ICD-10-CM | POA: Diagnosis not present

## 2015-05-29 DIAGNOSIS — M9902 Segmental and somatic dysfunction of thoracic region: Secondary | ICD-10-CM | POA: Diagnosis not present

## 2015-06-04 DIAGNOSIS — J3081 Allergic rhinitis due to animal (cat) (dog) hair and dander: Secondary | ICD-10-CM | POA: Diagnosis not present

## 2015-06-04 DIAGNOSIS — J301 Allergic rhinitis due to pollen: Secondary | ICD-10-CM | POA: Diagnosis not present

## 2015-06-04 DIAGNOSIS — J3089 Other allergic rhinitis: Secondary | ICD-10-CM | POA: Diagnosis not present

## 2015-06-11 DIAGNOSIS — J3089 Other allergic rhinitis: Secondary | ICD-10-CM | POA: Diagnosis not present

## 2015-06-11 DIAGNOSIS — J3081 Allergic rhinitis due to animal (cat) (dog) hair and dander: Secondary | ICD-10-CM | POA: Diagnosis not present

## 2015-06-11 DIAGNOSIS — J301 Allergic rhinitis due to pollen: Secondary | ICD-10-CM | POA: Diagnosis not present

## 2015-06-18 DIAGNOSIS — J3081 Allergic rhinitis due to animal (cat) (dog) hair and dander: Secondary | ICD-10-CM | POA: Diagnosis not present

## 2015-06-18 DIAGNOSIS — J301 Allergic rhinitis due to pollen: Secondary | ICD-10-CM | POA: Diagnosis not present

## 2015-06-18 DIAGNOSIS — J3089 Other allergic rhinitis: Secondary | ICD-10-CM | POA: Diagnosis not present

## 2015-06-25 DIAGNOSIS — J3081 Allergic rhinitis due to animal (cat) (dog) hair and dander: Secondary | ICD-10-CM | POA: Diagnosis not present

## 2015-06-25 DIAGNOSIS — J3089 Other allergic rhinitis: Secondary | ICD-10-CM | POA: Diagnosis not present

## 2015-06-25 DIAGNOSIS — J301 Allergic rhinitis due to pollen: Secondary | ICD-10-CM | POA: Diagnosis not present

## 2015-06-26 DIAGNOSIS — M542 Cervicalgia: Secondary | ICD-10-CM | POA: Diagnosis not present

## 2015-06-26 DIAGNOSIS — M9902 Segmental and somatic dysfunction of thoracic region: Secondary | ICD-10-CM | POA: Diagnosis not present

## 2015-06-26 DIAGNOSIS — M9901 Segmental and somatic dysfunction of cervical region: Secondary | ICD-10-CM | POA: Diagnosis not present

## 2015-06-26 DIAGNOSIS — M9903 Segmental and somatic dysfunction of lumbar region: Secondary | ICD-10-CM | POA: Diagnosis not present

## 2015-07-04 DIAGNOSIS — Z8673 Personal history of transient ischemic attack (TIA), and cerebral infarction without residual deficits: Secondary | ICD-10-CM | POA: Diagnosis not present

## 2015-07-04 DIAGNOSIS — R05 Cough: Secondary | ICD-10-CM | POA: Diagnosis not present

## 2015-07-04 DIAGNOSIS — J3081 Allergic rhinitis due to animal (cat) (dog) hair and dander: Secondary | ICD-10-CM | POA: Diagnosis not present

## 2015-07-04 DIAGNOSIS — J301 Allergic rhinitis due to pollen: Secondary | ICD-10-CM | POA: Diagnosis not present

## 2015-07-04 DIAGNOSIS — K219 Gastro-esophageal reflux disease without esophagitis: Secondary | ICD-10-CM | POA: Diagnosis not present

## 2015-07-04 DIAGNOSIS — J3089 Other allergic rhinitis: Secondary | ICD-10-CM | POA: Diagnosis not present

## 2015-07-09 DIAGNOSIS — J3081 Allergic rhinitis due to animal (cat) (dog) hair and dander: Secondary | ICD-10-CM | POA: Diagnosis not present

## 2015-07-09 DIAGNOSIS — J3089 Other allergic rhinitis: Secondary | ICD-10-CM | POA: Diagnosis not present

## 2015-07-09 DIAGNOSIS — J301 Allergic rhinitis due to pollen: Secondary | ICD-10-CM | POA: Diagnosis not present

## 2015-07-12 DIAGNOSIS — J3089 Other allergic rhinitis: Secondary | ICD-10-CM | POA: Diagnosis not present

## 2015-07-12 DIAGNOSIS — J301 Allergic rhinitis due to pollen: Secondary | ICD-10-CM | POA: Diagnosis not present

## 2015-07-12 DIAGNOSIS — J3081 Allergic rhinitis due to animal (cat) (dog) hair and dander: Secondary | ICD-10-CM | POA: Diagnosis not present

## 2015-07-16 DIAGNOSIS — J3089 Other allergic rhinitis: Secondary | ICD-10-CM | POA: Diagnosis not present

## 2015-07-16 DIAGNOSIS — J3081 Allergic rhinitis due to animal (cat) (dog) hair and dander: Secondary | ICD-10-CM | POA: Diagnosis not present

## 2015-07-16 DIAGNOSIS — J301 Allergic rhinitis due to pollen: Secondary | ICD-10-CM | POA: Diagnosis not present

## 2015-07-23 DIAGNOSIS — J301 Allergic rhinitis due to pollen: Secondary | ICD-10-CM | POA: Diagnosis not present

## 2015-07-23 DIAGNOSIS — J3081 Allergic rhinitis due to animal (cat) (dog) hair and dander: Secondary | ICD-10-CM | POA: Diagnosis not present

## 2015-07-23 DIAGNOSIS — L570 Actinic keratosis: Secondary | ICD-10-CM | POA: Diagnosis not present

## 2015-07-23 DIAGNOSIS — J3089 Other allergic rhinitis: Secondary | ICD-10-CM | POA: Diagnosis not present

## 2015-07-24 DIAGNOSIS — M9902 Segmental and somatic dysfunction of thoracic region: Secondary | ICD-10-CM | POA: Diagnosis not present

## 2015-07-24 DIAGNOSIS — M9901 Segmental and somatic dysfunction of cervical region: Secondary | ICD-10-CM | POA: Diagnosis not present

## 2015-07-24 DIAGNOSIS — M9903 Segmental and somatic dysfunction of lumbar region: Secondary | ICD-10-CM | POA: Diagnosis not present

## 2015-07-24 DIAGNOSIS — M542 Cervicalgia: Secondary | ICD-10-CM | POA: Diagnosis not present

## 2015-07-30 DIAGNOSIS — J3081 Allergic rhinitis due to animal (cat) (dog) hair and dander: Secondary | ICD-10-CM | POA: Diagnosis not present

## 2015-07-30 DIAGNOSIS — J3089 Other allergic rhinitis: Secondary | ICD-10-CM | POA: Diagnosis not present

## 2015-07-30 DIAGNOSIS — J301 Allergic rhinitis due to pollen: Secondary | ICD-10-CM | POA: Diagnosis not present

## 2015-08-06 DIAGNOSIS — J3081 Allergic rhinitis due to animal (cat) (dog) hair and dander: Secondary | ICD-10-CM | POA: Diagnosis not present

## 2015-08-06 DIAGNOSIS — J3089 Other allergic rhinitis: Secondary | ICD-10-CM | POA: Diagnosis not present

## 2015-08-06 DIAGNOSIS — J301 Allergic rhinitis due to pollen: Secondary | ICD-10-CM | POA: Diagnosis not present

## 2015-08-07 DIAGNOSIS — K219 Gastro-esophageal reflux disease without esophagitis: Secondary | ICD-10-CM | POA: Diagnosis not present

## 2015-08-07 DIAGNOSIS — R05 Cough: Secondary | ICD-10-CM | POA: Diagnosis not present

## 2015-08-13 DIAGNOSIS — J3081 Allergic rhinitis due to animal (cat) (dog) hair and dander: Secondary | ICD-10-CM | POA: Diagnosis not present

## 2015-08-13 DIAGNOSIS — J3089 Other allergic rhinitis: Secondary | ICD-10-CM | POA: Diagnosis not present

## 2015-08-13 DIAGNOSIS — J301 Allergic rhinitis due to pollen: Secondary | ICD-10-CM | POA: Diagnosis not present

## 2015-08-16 DIAGNOSIS — J301 Allergic rhinitis due to pollen: Secondary | ICD-10-CM | POA: Diagnosis not present

## 2015-08-16 DIAGNOSIS — J3081 Allergic rhinitis due to animal (cat) (dog) hair and dander: Secondary | ICD-10-CM | POA: Diagnosis not present

## 2015-08-16 DIAGNOSIS — J3089 Other allergic rhinitis: Secondary | ICD-10-CM | POA: Diagnosis not present

## 2015-08-21 DIAGNOSIS — M542 Cervicalgia: Secondary | ICD-10-CM | POA: Diagnosis not present

## 2015-08-21 DIAGNOSIS — M9901 Segmental and somatic dysfunction of cervical region: Secondary | ICD-10-CM | POA: Diagnosis not present

## 2015-08-21 DIAGNOSIS — J301 Allergic rhinitis due to pollen: Secondary | ICD-10-CM | POA: Diagnosis not present

## 2015-08-21 DIAGNOSIS — M9902 Segmental and somatic dysfunction of thoracic region: Secondary | ICD-10-CM | POA: Diagnosis not present

## 2015-08-21 DIAGNOSIS — J3089 Other allergic rhinitis: Secondary | ICD-10-CM | POA: Diagnosis not present

## 2015-08-21 DIAGNOSIS — J3081 Allergic rhinitis due to animal (cat) (dog) hair and dander: Secondary | ICD-10-CM | POA: Diagnosis not present

## 2015-08-21 DIAGNOSIS — M9903 Segmental and somatic dysfunction of lumbar region: Secondary | ICD-10-CM | POA: Diagnosis not present

## 2015-08-22 DIAGNOSIS — E785 Hyperlipidemia, unspecified: Secondary | ICD-10-CM | POA: Diagnosis not present

## 2015-08-22 DIAGNOSIS — I679 Cerebrovascular disease, unspecified: Secondary | ICD-10-CM | POA: Diagnosis not present

## 2015-08-22 DIAGNOSIS — R05 Cough: Secondary | ICD-10-CM | POA: Diagnosis not present

## 2015-08-22 DIAGNOSIS — I10 Essential (primary) hypertension: Secondary | ICD-10-CM | POA: Diagnosis not present

## 2015-08-24 DIAGNOSIS — J3089 Other allergic rhinitis: Secondary | ICD-10-CM | POA: Diagnosis not present

## 2015-08-24 DIAGNOSIS — J301 Allergic rhinitis due to pollen: Secondary | ICD-10-CM | POA: Diagnosis not present

## 2015-08-24 DIAGNOSIS — J3081 Allergic rhinitis due to animal (cat) (dog) hair and dander: Secondary | ICD-10-CM | POA: Diagnosis not present

## 2015-08-27 DIAGNOSIS — J3089 Other allergic rhinitis: Secondary | ICD-10-CM | POA: Diagnosis not present

## 2015-08-27 DIAGNOSIS — J3081 Allergic rhinitis due to animal (cat) (dog) hair and dander: Secondary | ICD-10-CM | POA: Diagnosis not present

## 2015-08-27 DIAGNOSIS — J301 Allergic rhinitis due to pollen: Secondary | ICD-10-CM | POA: Diagnosis not present

## 2015-09-03 DIAGNOSIS — J3081 Allergic rhinitis due to animal (cat) (dog) hair and dander: Secondary | ICD-10-CM | POA: Diagnosis not present

## 2015-09-03 DIAGNOSIS — J301 Allergic rhinitis due to pollen: Secondary | ICD-10-CM | POA: Diagnosis not present

## 2015-09-03 DIAGNOSIS — J3089 Other allergic rhinitis: Secondary | ICD-10-CM | POA: Diagnosis not present

## 2015-09-10 DIAGNOSIS — J3081 Allergic rhinitis due to animal (cat) (dog) hair and dander: Secondary | ICD-10-CM | POA: Diagnosis not present

## 2015-09-10 DIAGNOSIS — J301 Allergic rhinitis due to pollen: Secondary | ICD-10-CM | POA: Diagnosis not present

## 2015-09-10 DIAGNOSIS — J3089 Other allergic rhinitis: Secondary | ICD-10-CM | POA: Diagnosis not present

## 2015-09-17 DIAGNOSIS — J3089 Other allergic rhinitis: Secondary | ICD-10-CM | POA: Diagnosis not present

## 2015-09-17 DIAGNOSIS — J3081 Allergic rhinitis due to animal (cat) (dog) hair and dander: Secondary | ICD-10-CM | POA: Diagnosis not present

## 2015-09-17 DIAGNOSIS — J301 Allergic rhinitis due to pollen: Secondary | ICD-10-CM | POA: Diagnosis not present

## 2015-09-26 DIAGNOSIS — M542 Cervicalgia: Secondary | ICD-10-CM | POA: Diagnosis not present

## 2015-09-26 DIAGNOSIS — J301 Allergic rhinitis due to pollen: Secondary | ICD-10-CM | POA: Diagnosis not present

## 2015-09-26 DIAGNOSIS — M9902 Segmental and somatic dysfunction of thoracic region: Secondary | ICD-10-CM | POA: Diagnosis not present

## 2015-09-26 DIAGNOSIS — M9901 Segmental and somatic dysfunction of cervical region: Secondary | ICD-10-CM | POA: Diagnosis not present

## 2015-09-26 DIAGNOSIS — J3089 Other allergic rhinitis: Secondary | ICD-10-CM | POA: Diagnosis not present

## 2015-09-26 DIAGNOSIS — M9903 Segmental and somatic dysfunction of lumbar region: Secondary | ICD-10-CM | POA: Diagnosis not present

## 2015-09-26 DIAGNOSIS — J3081 Allergic rhinitis due to animal (cat) (dog) hair and dander: Secondary | ICD-10-CM | POA: Diagnosis not present

## 2015-10-01 DIAGNOSIS — J301 Allergic rhinitis due to pollen: Secondary | ICD-10-CM | POA: Diagnosis not present

## 2015-10-01 DIAGNOSIS — J3081 Allergic rhinitis due to animal (cat) (dog) hair and dander: Secondary | ICD-10-CM | POA: Diagnosis not present

## 2015-10-01 DIAGNOSIS — J3089 Other allergic rhinitis: Secondary | ICD-10-CM | POA: Diagnosis not present

## 2015-10-08 DIAGNOSIS — J3081 Allergic rhinitis due to animal (cat) (dog) hair and dander: Secondary | ICD-10-CM | POA: Diagnosis not present

## 2015-10-08 DIAGNOSIS — J3089 Other allergic rhinitis: Secondary | ICD-10-CM | POA: Diagnosis not present

## 2015-10-08 DIAGNOSIS — J301 Allergic rhinitis due to pollen: Secondary | ICD-10-CM | POA: Diagnosis not present

## 2015-10-09 DIAGNOSIS — J3081 Allergic rhinitis due to animal (cat) (dog) hair and dander: Secondary | ICD-10-CM | POA: Diagnosis not present

## 2015-10-22 DIAGNOSIS — J3081 Allergic rhinitis due to animal (cat) (dog) hair and dander: Secondary | ICD-10-CM | POA: Diagnosis not present

## 2015-10-22 DIAGNOSIS — J3089 Other allergic rhinitis: Secondary | ICD-10-CM | POA: Diagnosis not present

## 2015-10-22 DIAGNOSIS — J301 Allergic rhinitis due to pollen: Secondary | ICD-10-CM | POA: Diagnosis not present

## 2015-10-29 DIAGNOSIS — J301 Allergic rhinitis due to pollen: Secondary | ICD-10-CM | POA: Diagnosis not present

## 2015-10-29 DIAGNOSIS — J3081 Allergic rhinitis due to animal (cat) (dog) hair and dander: Secondary | ICD-10-CM | POA: Diagnosis not present

## 2015-10-29 DIAGNOSIS — J3089 Other allergic rhinitis: Secondary | ICD-10-CM | POA: Diagnosis not present

## 2015-11-02 DIAGNOSIS — B9689 Other specified bacterial agents as the cause of diseases classified elsewhere: Secondary | ICD-10-CM | POA: Diagnosis not present

## 2015-11-02 DIAGNOSIS — J019 Acute sinusitis, unspecified: Secondary | ICD-10-CM | POA: Diagnosis not present

## 2015-11-05 DIAGNOSIS — M9903 Segmental and somatic dysfunction of lumbar region: Secondary | ICD-10-CM | POA: Diagnosis not present

## 2015-11-05 DIAGNOSIS — J3089 Other allergic rhinitis: Secondary | ICD-10-CM | POA: Diagnosis not present

## 2015-11-05 DIAGNOSIS — J3081 Allergic rhinitis due to animal (cat) (dog) hair and dander: Secondary | ICD-10-CM | POA: Diagnosis not present

## 2015-11-05 DIAGNOSIS — M62830 Muscle spasm of back: Secondary | ICD-10-CM | POA: Diagnosis not present

## 2015-11-05 DIAGNOSIS — M545 Low back pain: Secondary | ICD-10-CM | POA: Diagnosis not present

## 2015-11-05 DIAGNOSIS — J301 Allergic rhinitis due to pollen: Secondary | ICD-10-CM | POA: Diagnosis not present

## 2015-11-05 DIAGNOSIS — M5136 Other intervertebral disc degeneration, lumbar region: Secondary | ICD-10-CM | POA: Diagnosis not present

## 2015-11-07 DIAGNOSIS — M545 Low back pain: Secondary | ICD-10-CM | POA: Diagnosis not present

## 2015-11-07 DIAGNOSIS — M9903 Segmental and somatic dysfunction of lumbar region: Secondary | ICD-10-CM | POA: Diagnosis not present

## 2015-11-07 DIAGNOSIS — M62830 Muscle spasm of back: Secondary | ICD-10-CM | POA: Diagnosis not present

## 2015-11-07 DIAGNOSIS — M5136 Other intervertebral disc degeneration, lumbar region: Secondary | ICD-10-CM | POA: Diagnosis not present

## 2015-11-08 DIAGNOSIS — M5136 Other intervertebral disc degeneration, lumbar region: Secondary | ICD-10-CM | POA: Diagnosis not present

## 2015-11-08 DIAGNOSIS — M9903 Segmental and somatic dysfunction of lumbar region: Secondary | ICD-10-CM | POA: Diagnosis not present

## 2015-11-08 DIAGNOSIS — M545 Low back pain: Secondary | ICD-10-CM | POA: Diagnosis not present

## 2015-11-08 DIAGNOSIS — M62830 Muscle spasm of back: Secondary | ICD-10-CM | POA: Diagnosis not present

## 2015-11-12 DIAGNOSIS — J3081 Allergic rhinitis due to animal (cat) (dog) hair and dander: Secondary | ICD-10-CM | POA: Diagnosis not present

## 2015-11-12 DIAGNOSIS — M5136 Other intervertebral disc degeneration, lumbar region: Secondary | ICD-10-CM | POA: Diagnosis not present

## 2015-11-12 DIAGNOSIS — J3089 Other allergic rhinitis: Secondary | ICD-10-CM | POA: Diagnosis not present

## 2015-11-12 DIAGNOSIS — M545 Low back pain: Secondary | ICD-10-CM | POA: Diagnosis not present

## 2015-11-12 DIAGNOSIS — J301 Allergic rhinitis due to pollen: Secondary | ICD-10-CM | POA: Diagnosis not present

## 2015-11-12 DIAGNOSIS — M9903 Segmental and somatic dysfunction of lumbar region: Secondary | ICD-10-CM | POA: Diagnosis not present

## 2015-11-12 DIAGNOSIS — M62830 Muscle spasm of back: Secondary | ICD-10-CM | POA: Diagnosis not present

## 2015-11-15 DIAGNOSIS — M5136 Other intervertebral disc degeneration, lumbar region: Secondary | ICD-10-CM | POA: Diagnosis not present

## 2015-11-15 DIAGNOSIS — M545 Low back pain: Secondary | ICD-10-CM | POA: Diagnosis not present

## 2015-11-15 DIAGNOSIS — M9903 Segmental and somatic dysfunction of lumbar region: Secondary | ICD-10-CM | POA: Diagnosis not present

## 2015-11-15 DIAGNOSIS — M62830 Muscle spasm of back: Secondary | ICD-10-CM | POA: Diagnosis not present

## 2015-11-20 DIAGNOSIS — J3089 Other allergic rhinitis: Secondary | ICD-10-CM | POA: Diagnosis not present

## 2015-11-20 DIAGNOSIS — J301 Allergic rhinitis due to pollen: Secondary | ICD-10-CM | POA: Diagnosis not present

## 2015-11-20 DIAGNOSIS — M62830 Muscle spasm of back: Secondary | ICD-10-CM | POA: Diagnosis not present

## 2015-11-20 DIAGNOSIS — M5136 Other intervertebral disc degeneration, lumbar region: Secondary | ICD-10-CM | POA: Diagnosis not present

## 2015-11-20 DIAGNOSIS — J3081 Allergic rhinitis due to animal (cat) (dog) hair and dander: Secondary | ICD-10-CM | POA: Diagnosis not present

## 2015-11-20 DIAGNOSIS — M9903 Segmental and somatic dysfunction of lumbar region: Secondary | ICD-10-CM | POA: Diagnosis not present

## 2015-11-20 DIAGNOSIS — M545 Low back pain: Secondary | ICD-10-CM | POA: Diagnosis not present

## 2015-11-27 DIAGNOSIS — J3089 Other allergic rhinitis: Secondary | ICD-10-CM | POA: Diagnosis not present

## 2015-11-27 DIAGNOSIS — J3081 Allergic rhinitis due to animal (cat) (dog) hair and dander: Secondary | ICD-10-CM | POA: Diagnosis not present

## 2015-11-27 DIAGNOSIS — J301 Allergic rhinitis due to pollen: Secondary | ICD-10-CM | POA: Diagnosis not present

## 2015-12-03 DIAGNOSIS — M5136 Other intervertebral disc degeneration, lumbar region: Secondary | ICD-10-CM | POA: Diagnosis not present

## 2015-12-03 DIAGNOSIS — M545 Low back pain: Secondary | ICD-10-CM | POA: Diagnosis not present

## 2015-12-03 DIAGNOSIS — M9903 Segmental and somatic dysfunction of lumbar region: Secondary | ICD-10-CM | POA: Diagnosis not present

## 2015-12-03 DIAGNOSIS — M62830 Muscle spasm of back: Secondary | ICD-10-CM | POA: Diagnosis not present

## 2015-12-03 DIAGNOSIS — J301 Allergic rhinitis due to pollen: Secondary | ICD-10-CM | POA: Diagnosis not present

## 2015-12-03 DIAGNOSIS — J3081 Allergic rhinitis due to animal (cat) (dog) hair and dander: Secondary | ICD-10-CM | POA: Diagnosis not present

## 2015-12-03 DIAGNOSIS — J3089 Other allergic rhinitis: Secondary | ICD-10-CM | POA: Diagnosis not present

## 2015-12-06 DIAGNOSIS — M9903 Segmental and somatic dysfunction of lumbar region: Secondary | ICD-10-CM | POA: Diagnosis not present

## 2015-12-06 DIAGNOSIS — M62830 Muscle spasm of back: Secondary | ICD-10-CM | POA: Diagnosis not present

## 2015-12-06 DIAGNOSIS — M545 Low back pain: Secondary | ICD-10-CM | POA: Diagnosis not present

## 2015-12-06 DIAGNOSIS — M5136 Other intervertebral disc degeneration, lumbar region: Secondary | ICD-10-CM | POA: Diagnosis not present

## 2015-12-07 DIAGNOSIS — Z23 Encounter for immunization: Secondary | ICD-10-CM | POA: Diagnosis not present

## 2015-12-10 DIAGNOSIS — J3081 Allergic rhinitis due to animal (cat) (dog) hair and dander: Secondary | ICD-10-CM | POA: Diagnosis not present

## 2015-12-10 DIAGNOSIS — J301 Allergic rhinitis due to pollen: Secondary | ICD-10-CM | POA: Diagnosis not present

## 2015-12-10 DIAGNOSIS — H1045 Other chronic allergic conjunctivitis: Secondary | ICD-10-CM | POA: Diagnosis not present

## 2015-12-10 DIAGNOSIS — J3089 Other allergic rhinitis: Secondary | ICD-10-CM | POA: Diagnosis not present

## 2015-12-11 DIAGNOSIS — M5136 Other intervertebral disc degeneration, lumbar region: Secondary | ICD-10-CM | POA: Diagnosis not present

## 2015-12-11 DIAGNOSIS — M9903 Segmental and somatic dysfunction of lumbar region: Secondary | ICD-10-CM | POA: Diagnosis not present

## 2015-12-11 DIAGNOSIS — M545 Low back pain: Secondary | ICD-10-CM | POA: Diagnosis not present

## 2015-12-11 DIAGNOSIS — M62830 Muscle spasm of back: Secondary | ICD-10-CM | POA: Diagnosis not present

## 2015-12-13 DIAGNOSIS — M62830 Muscle spasm of back: Secondary | ICD-10-CM | POA: Diagnosis not present

## 2015-12-13 DIAGNOSIS — M5136 Other intervertebral disc degeneration, lumbar region: Secondary | ICD-10-CM | POA: Diagnosis not present

## 2015-12-13 DIAGNOSIS — M9903 Segmental and somatic dysfunction of lumbar region: Secondary | ICD-10-CM | POA: Diagnosis not present

## 2015-12-13 DIAGNOSIS — M545 Low back pain: Secondary | ICD-10-CM | POA: Diagnosis not present

## 2015-12-14 DIAGNOSIS — J301 Allergic rhinitis due to pollen: Secondary | ICD-10-CM | POA: Diagnosis not present

## 2015-12-14 DIAGNOSIS — J3089 Other allergic rhinitis: Secondary | ICD-10-CM | POA: Diagnosis not present

## 2015-12-14 DIAGNOSIS — J3081 Allergic rhinitis due to animal (cat) (dog) hair and dander: Secondary | ICD-10-CM | POA: Diagnosis not present

## 2015-12-17 DIAGNOSIS — J301 Allergic rhinitis due to pollen: Secondary | ICD-10-CM | POA: Diagnosis not present

## 2015-12-17 DIAGNOSIS — J3081 Allergic rhinitis due to animal (cat) (dog) hair and dander: Secondary | ICD-10-CM | POA: Diagnosis not present

## 2015-12-17 DIAGNOSIS — J3089 Other allergic rhinitis: Secondary | ICD-10-CM | POA: Diagnosis not present

## 2015-12-20 DIAGNOSIS — M5136 Other intervertebral disc degeneration, lumbar region: Secondary | ICD-10-CM | POA: Diagnosis not present

## 2015-12-20 DIAGNOSIS — M545 Low back pain: Secondary | ICD-10-CM | POA: Diagnosis not present

## 2015-12-20 DIAGNOSIS — M9903 Segmental and somatic dysfunction of lumbar region: Secondary | ICD-10-CM | POA: Diagnosis not present

## 2015-12-20 DIAGNOSIS — M62830 Muscle spasm of back: Secondary | ICD-10-CM | POA: Diagnosis not present

## 2015-12-24 DIAGNOSIS — J3089 Other allergic rhinitis: Secondary | ICD-10-CM | POA: Diagnosis not present

## 2015-12-24 DIAGNOSIS — J3081 Allergic rhinitis due to animal (cat) (dog) hair and dander: Secondary | ICD-10-CM | POA: Diagnosis not present

## 2015-12-24 DIAGNOSIS — J301 Allergic rhinitis due to pollen: Secondary | ICD-10-CM | POA: Diagnosis not present

## 2015-12-27 DIAGNOSIS — M62830 Muscle spasm of back: Secondary | ICD-10-CM | POA: Diagnosis not present

## 2015-12-27 DIAGNOSIS — M9903 Segmental and somatic dysfunction of lumbar region: Secondary | ICD-10-CM | POA: Diagnosis not present

## 2015-12-27 DIAGNOSIS — M545 Low back pain: Secondary | ICD-10-CM | POA: Diagnosis not present

## 2015-12-27 DIAGNOSIS — M5136 Other intervertebral disc degeneration, lumbar region: Secondary | ICD-10-CM | POA: Diagnosis not present

## 2016-01-01 DIAGNOSIS — J3089 Other allergic rhinitis: Secondary | ICD-10-CM | POA: Diagnosis not present

## 2016-01-01 DIAGNOSIS — J3081 Allergic rhinitis due to animal (cat) (dog) hair and dander: Secondary | ICD-10-CM | POA: Diagnosis not present

## 2016-01-01 DIAGNOSIS — J301 Allergic rhinitis due to pollen: Secondary | ICD-10-CM | POA: Diagnosis not present

## 2016-01-03 DIAGNOSIS — M9903 Segmental and somatic dysfunction of lumbar region: Secondary | ICD-10-CM | POA: Diagnosis not present

## 2016-01-03 DIAGNOSIS — M545 Low back pain: Secondary | ICD-10-CM | POA: Diagnosis not present

## 2016-01-03 DIAGNOSIS — M62830 Muscle spasm of back: Secondary | ICD-10-CM | POA: Diagnosis not present

## 2016-01-03 DIAGNOSIS — M5136 Other intervertebral disc degeneration, lumbar region: Secondary | ICD-10-CM | POA: Diagnosis not present

## 2016-01-07 DIAGNOSIS — J3081 Allergic rhinitis due to animal (cat) (dog) hair and dander: Secondary | ICD-10-CM | POA: Diagnosis not present

## 2016-01-07 DIAGNOSIS — J301 Allergic rhinitis due to pollen: Secondary | ICD-10-CM | POA: Diagnosis not present

## 2016-01-07 DIAGNOSIS — H2513 Age-related nuclear cataract, bilateral: Secondary | ICD-10-CM | POA: Diagnosis not present

## 2016-01-07 DIAGNOSIS — J3089 Other allergic rhinitis: Secondary | ICD-10-CM | POA: Diagnosis not present

## 2016-01-07 DIAGNOSIS — H5703 Miosis: Secondary | ICD-10-CM | POA: Diagnosis not present

## 2016-01-11 DIAGNOSIS — J3089 Other allergic rhinitis: Secondary | ICD-10-CM | POA: Diagnosis not present

## 2016-01-11 DIAGNOSIS — J301 Allergic rhinitis due to pollen: Secondary | ICD-10-CM | POA: Diagnosis not present

## 2016-01-11 DIAGNOSIS — J3081 Allergic rhinitis due to animal (cat) (dog) hair and dander: Secondary | ICD-10-CM | POA: Diagnosis not present

## 2016-01-14 DIAGNOSIS — J3081 Allergic rhinitis due to animal (cat) (dog) hair and dander: Secondary | ICD-10-CM | POA: Diagnosis not present

## 2016-01-14 DIAGNOSIS — J301 Allergic rhinitis due to pollen: Secondary | ICD-10-CM | POA: Diagnosis not present

## 2016-01-14 DIAGNOSIS — J3089 Other allergic rhinitis: Secondary | ICD-10-CM | POA: Diagnosis not present

## 2016-01-15 DIAGNOSIS — I10 Essential (primary) hypertension: Secondary | ICD-10-CM | POA: Diagnosis not present

## 2016-01-15 DIAGNOSIS — I209 Angina pectoris, unspecified: Secondary | ICD-10-CM | POA: Diagnosis not present

## 2016-01-15 DIAGNOSIS — E7800 Pure hypercholesterolemia, unspecified: Secondary | ICD-10-CM | POA: Diagnosis not present

## 2016-01-15 DIAGNOSIS — R0602 Shortness of breath: Secondary | ICD-10-CM | POA: Diagnosis not present

## 2016-01-15 DIAGNOSIS — Z8673 Personal history of transient ischemic attack (TIA), and cerebral infarction without residual deficits: Secondary | ICD-10-CM | POA: Diagnosis not present

## 2016-01-15 DIAGNOSIS — R079 Chest pain, unspecified: Secondary | ICD-10-CM | POA: Diagnosis not present

## 2016-01-17 DIAGNOSIS — J3081 Allergic rhinitis due to animal (cat) (dog) hair and dander: Secondary | ICD-10-CM | POA: Diagnosis not present

## 2016-01-17 DIAGNOSIS — M9903 Segmental and somatic dysfunction of lumbar region: Secondary | ICD-10-CM | POA: Diagnosis not present

## 2016-01-17 DIAGNOSIS — M62830 Muscle spasm of back: Secondary | ICD-10-CM | POA: Diagnosis not present

## 2016-01-17 DIAGNOSIS — J3089 Other allergic rhinitis: Secondary | ICD-10-CM | POA: Diagnosis not present

## 2016-01-17 DIAGNOSIS — M545 Low back pain: Secondary | ICD-10-CM | POA: Diagnosis not present

## 2016-01-17 DIAGNOSIS — J301 Allergic rhinitis due to pollen: Secondary | ICD-10-CM | POA: Diagnosis not present

## 2016-01-17 DIAGNOSIS — M5136 Other intervertebral disc degeneration, lumbar region: Secondary | ICD-10-CM | POA: Diagnosis not present

## 2016-01-21 DIAGNOSIS — J3089 Other allergic rhinitis: Secondary | ICD-10-CM | POA: Diagnosis not present

## 2016-01-21 DIAGNOSIS — J3081 Allergic rhinitis due to animal (cat) (dog) hair and dander: Secondary | ICD-10-CM | POA: Diagnosis not present

## 2016-01-21 DIAGNOSIS — J301 Allergic rhinitis due to pollen: Secondary | ICD-10-CM | POA: Diagnosis not present

## 2016-01-25 DIAGNOSIS — I10 Essential (primary) hypertension: Secondary | ICD-10-CM | POA: Diagnosis not present

## 2016-01-25 DIAGNOSIS — I200 Unstable angina: Secondary | ICD-10-CM | POA: Diagnosis not present

## 2016-01-25 DIAGNOSIS — E7800 Pure hypercholesterolemia, unspecified: Secondary | ICD-10-CM | POA: Diagnosis not present

## 2016-01-25 DIAGNOSIS — Z8673 Personal history of transient ischemic attack (TIA), and cerebral infarction without residual deficits: Secondary | ICD-10-CM | POA: Diagnosis not present

## 2016-01-28 DIAGNOSIS — J3089 Other allergic rhinitis: Secondary | ICD-10-CM | POA: Diagnosis not present

## 2016-01-28 DIAGNOSIS — J301 Allergic rhinitis due to pollen: Secondary | ICD-10-CM | POA: Diagnosis not present

## 2016-01-28 DIAGNOSIS — J3081 Allergic rhinitis due to animal (cat) (dog) hair and dander: Secondary | ICD-10-CM | POA: Diagnosis not present

## 2016-01-31 DIAGNOSIS — Z8673 Personal history of transient ischemic attack (TIA), and cerebral infarction without residual deficits: Secondary | ICD-10-CM | POA: Diagnosis not present

## 2016-01-31 DIAGNOSIS — I10 Essential (primary) hypertension: Secondary | ICD-10-CM | POA: Diagnosis not present

## 2016-01-31 DIAGNOSIS — I209 Angina pectoris, unspecified: Secondary | ICD-10-CM | POA: Diagnosis not present

## 2016-02-01 DIAGNOSIS — Z8673 Personal history of transient ischemic attack (TIA), and cerebral infarction without residual deficits: Secondary | ICD-10-CM | POA: Diagnosis not present

## 2016-02-01 DIAGNOSIS — E7800 Pure hypercholesterolemia, unspecified: Secondary | ICD-10-CM | POA: Diagnosis not present

## 2016-02-01 DIAGNOSIS — I209 Angina pectoris, unspecified: Secondary | ICD-10-CM | POA: Diagnosis not present

## 2016-02-01 DIAGNOSIS — I10 Essential (primary) hypertension: Secondary | ICD-10-CM | POA: Diagnosis not present

## 2016-02-02 DIAGNOSIS — I209 Angina pectoris, unspecified: Secondary | ICD-10-CM | POA: Diagnosis present

## 2016-02-02 NOTE — H&P (Signed)
OFFICE VISIT NOTES COPIED TO EPIC FOR DOCUMENTATION  . History of Present Illness Laverda Page MD; 02/02/2016 11:24 AM) Patient words: Last O/V 01/15/2016; F/U Nuc, Echo results.  The patient is a 70 year old male who presents for a Follow-up for Angina. Patient was seen by me 3 weeks ago with symptoms suggestive of angina pectoris, he underwent stress testing on 01/25/2016 and also echocardiogram on 01/31/2016, revealing inferior lateral ischemia and mid to distal anterolateral ischemia.  I started him on metoprolol and isosorbide mononitrate which is tolerating. He has started to feel well. He still afraid to push himself due to recurrence of angina pectoris. He has not used any sublingual nitroglycerin.  Notes occasional swelling in right foot. Denies diaphoresis, paroxysmal nocturnal dysnea, current chest pain or pressure. He does not smoke. His factors include hypertension, hyperlipidemia and known cerebral atherosclerosis and male is greater than 65 years.  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 Has residual sensory defect with inability to appreciate hot or cold on right side and also has slight limp on right when tired. He was seen by Dr. Trula Slade on 07/12/2014 for left vertebral artery stenosis noted during CVA workup who recommended medical therapy with f/u carotid duplex to be scheduled in April 2017 with neurology, however, was last seen by neurology in June 2016 and never returned for follow up.   Additional reasons for visit:  Follow-up for Shortness of breath    Problem List/Past Medical (April Garrison; 02/23/2016 9:55 AM) Worthy Keeler  05/23/2015: Glucose 109, creatinine 1.04, potassium 4.3, CMP otherwise normal, TSH 0.75, free T4 normal, total cholesterol 132, triglycerides 105 and HDL 39, LDL 72 Angina pectoris (I20.9)  Exercise myoview stress 01/25/2016: 1. The resting electrocardiogram  demonstrated normal sinus rhythm, normal resting conduction, no resting arrhythmias and normal rest repolarization. The stress electrocardiogram revealed 3.5 mm of horizontal ST depression in lead (s):II, III, aVF, V4, V5, V6 consistent with myocardial ischemia. Patient exercised on Bruce protocol for 7:51 minutes and achieved 9.9 METS. Stress test terminated due to dyspnea and 86% MPHR achieved (Target HR >85%). 2. The perfusion imaging study demonstrates very subtle inferolateral ischemia, small duct most moderate-sized. In addition there is very subtle small sized anteroseptal ischemia extending in the distal left ventricle. Left ventricular systolic function calculated by QGS was mildly depressed at 44%. This is a high risk study, consider further cardiac work-up. Essential hypertension (I10)  Hyperlipidemia, group A (E78.00)  H/O: CVA (cerebrovascular accident) (Z86.73)  Echocardiogram 07/03/2014: LVEF 55-60%, left atrium mildly dilated. CTA neck 07/03/2014: Gradual occlusion of the distal left vertebral artery in the upper neck to the C1 level. This more resembles sequelae of atherosclerotic disease and a focal dissection. High-grade stenosis of the left vertebral artery origin due to soft plaque. Mild right vertebral artery origin stenosis. Bilateral carotid bifurcation plaque without hemodynamically significant cervical carotid stenosis. MRA head 07/02/2014: Left lateral mid-to suspicious for possible small acute ischemic infarct. Remote lacunar infarcts involving the bilateral thalami. Age-related atrophy. Shortness of breath (R06.02)   Allergies (April Garrison; 02-23-16 9:55 AM) No Known Drug Allergies 01/15/2016  Family History (April Garrison; 23-Feb-2016 9:55 AM) Father  Deceased. at age 37; Hx of HTN, no heart attack or strokes, 1 stent but no other cardiovascular conditions Mother  Deceased. at age 64 from cerebral hemorrhage; Hx of HTN, CAD but no overt attacks or strokes Brother 2   1-Younger, has COPD; 1-Older  Social History (April Louretta Shorten; 15-Feb-2016 9:55 AM) Current tobacco use  Never smoker. Alcohol Use  Moderate alcohol use. Marital status  Widowed. Number of Children  2. Living Situation  Lives alone.  Past Surgical History (April Louretta Shorten; February 15, 2016 9:55 AM) Deviated Nose Septum Surgery 2007  Medication History (April Garrison; February 15, 2016 10:10 AM) Nitrostat (0.4MG Tab Sublingual, 1 (one) Tablet Sublingual every 5 minutes as needed for chest pain., Taken starting 01/15/2016) Active. Isosorbide Mononitrate ER (60MG Tablet ER 24HR, 1 (one) Tablet Oral daily, Taken starting 01/15/2016) Active. Metoprolol Succinate ER (25MG Tablet ER 24HR, 1 (one) Tablet Oral daily, Taken starting 01/15/2016) Active. Azelastine HCl (0.1% Solution, 1 puff each nostril Nasal two times daily) Active. Fluticasone Propionate (50MCG/ACT Suspension, 1 spray each nostril Nasal daily) Active. Levocetirizine Dihydrochloride (5MG Tablet, 1 Oral daily) Active. FLUoxetine HCl (20MG Capsule, 1 Oral daily) Active. Atorvastatin Calcium (40MG Tablet, 1 Oral daily) Active. Pantoprazole Sodium (20MG Tablet DR, 2 Oral daily) Active. Levothyroxine Sodium (112MCG Tablet, 1 Oral daily) Active. Lisinopril (5MG Tablet, 1 Oral daily) Active. Clopidogrel Bisulfate (75MG Tablet, 1 Oral daily) Active. CV[facility] Aspirin Low Dose (81MG Tablet DR, 1 Oral daily) Active. Vitamin C (1000MG Tablet, 1 Oral daily) Active. Multiple Vitamin (1 (one) Oral daily) Active. Medications Reconciled (verbally with pt/ list present)  Diagnostic Studies History (April Louretta Shorten; 2016/02/15 9:58 AM) Endoscopy 1997 Colonoscopy 2011 MRA Head 07/02/2014 Left lateral mid-to suspicious for possible small acute ischemic infarct. Remote lacunar infarcts involving the bilateral thalami. Age-related atrophy. CTA neck 07/03/2014 Gradual occlusion of the distal left vertebral artery in the upper neck to the C1  level. This more resembles sequelae of atherosclerotic disease and a focal dissection. High-grade stenosis of the left vertebral artery origin due to soft plaque. Mild right vertebral artery origin stenosis. Bilateral carotid bifurcation plaque without hemodynamically significant cervical carotid stenosis. Echocardiogram 07/03/2014 LVEF 55-60%, left atrium mildly dilated. Nuclear stress test 01/25/2016 1. The resting electrocardiogram demonstrated normal sinus rhythm, normal resting conduction, no resting arrhythmias and normal rest repolarization. The stress electrocardiogram revealed 3.5 mm of horizontal ST depression in lead (s):II, III, aVF, V4, V5, V6 consistent with myocardial ischemia. Patient exercised on Bruce protocol for 7:51 minutes and achieved 9.9 METS. Stress test terminated due to dyspnea and 86% MPHR achieved (Target HR >85%). 2. The perfusion imaging study demonstrates very subtle inferolateral ischemia, small duct most moderate-sized. In addition there is very subtle small sized anteroseptal ischemia extending in the distal left ventricle. Left ventricular systolic function calculated by QGS was mildly depressed at 44%. This is a high risk study, consider further cardiac work-up.    Review of Systems Laverda Page, MD; 02/02/2016 11:35 AM) General Not Present- Anorexia, Fatigue and Fever. Respiratory Present- Difficulty Breathing on Exertion. Not Present- Hemoptysis, Wakes up from Sleep Wheezing or Short of Breath and Wheezing. Cardiovascular Present- Chest Pain. Not Present- Claudications, Edema, Orthopnea and Paroxysmal Nocturnal Dyspnea. Gastrointestinal Not Present- Black, Tarry Stool, Change in Bowel Habits and Nausea. Neurological Present- Dysesthesia (Right side of the body). Not Present- Focal Neurological Symptoms and Syncope. Endocrine Not Present- Cold Intolerance, Excessive Sweating, Heat Intolerance and Thyroid Problems. Hematology Not Present- Anemia, Easy  Bruising, Petechiae and Prolonged Bleeding.  Vitals (April Garrison; 02/15/16 10:12 AM) 02-15-16 10:04 AM Weight: 185.44 lb Height: 66in Body Surface Area: 1.94 m Body Mass Index: 29.93 kg/m  Pulse: 66 (Regular)  P.OX: 98% (Room air) BP: 128/62 (Sitting, Left Arm, Standard)       Physical Exam Laverda Page, MD; 02/02/2016 11:35  AM) General Mental Status-Alert. General Appearance-Cooperative and Appears stated age. Build & Nutrition-Well built and Well nourished(overweight).  Head and Neck Thyroid Gland Characteristics - normal size and consistency and no palpable nodules.  Chest and Lung Exam Chest and lung exam reveals -quiet, even and easy respiratory effort with no use of accessory muscles, non-tender and on auscultation, normal breath sounds, no adventitious sounds.  Cardiovascular Cardiovascular examination reveals -normal heart sounds, regular rate and rhythm with no murmurs, carotid auscultation reveals no bruits, abdominal aorta auscultation reveals no bruits and no prominent pulsation, femoral artery auscultation bilaterally reveals normal pulses, no bruits, no thrills, normal pedal pulses bilaterally and no digital clubbing, cyanosis, edema, increased warmth or tenderness.  Abdomen Palpation/Percussion Normal exam - Non Tender and No hepatosplenomegaly.  Neurologic Neurologic evaluation reveals -alert and oriented x 3 with no impairment of recent or remote memory. Motor-Grossly intact without any focal deficits.  Musculoskeletal Global Assessment Left Lower Extremity - no deformities, masses or tenderness, no known fractures. Right Lower Extremity - no deformities, masses or tenderness, no known fractures.   Results Laverda Page MD; 02/02/2016 11:35 AM) Labs  Name Value Range Date METABOLIC PANEL, BASIC ([id number])   Collected: 02/01/2016 11:23 AM Glucose, Serum 105 mg/dL 65-99 mg/dL Collected:  02/01/2016 11:23 AM BUN 17 mg/dL 8-27 mg/dL Collected: 02/01/2016 11:23 AM Creatinine, Serum 1.23 mg/dL 0.76-1.27 mg/dL Collected: 02/01/2016 11:23 AM eGFR If NonAfricn Am 60 mL/min/1.73 >59 mL/min/1.73 Collected: 02/01/2016 11:23 AM eGFR If Africn Am 70 mL/min/1.73 >59 mL/min/1.73 Collected: 02/01/2016 11:23 AM BUN/Creatinine Ratio 14 10-24 Collected: 02/01/2016 11:23 AM Sodium, Serum 136 mmol/L 134-144 mmol/L Collected: 02/01/2016 11:23 AM Potassium, Serum 4.9 mmol/L 3.5-5.2 mmol/L Collected: 02/01/2016 11:23 AM Chloride, Serum 100 mmol/L 96-106 mmol/L Collected: 02/01/2016 11:23 AM Carbon Dioxide, Total 31 mmol/L 18-29 mmol/L Collected: 02/01/2016 11:23 AM Calcium, Serum 9.2 mg/dL 8.6-10.2 mg/dL Collected: 02/01/2016 11:23 AM CBC & PLATELETS (AUTO) ([id number])   Collected: 02/01/2016 11:23 AM WBC 7.7 x10E3/uL 3.4-10.8 x10E3/uL Collected: 02/01/2016 11:23 AM RBC 4.16 x10E6/uL 4.14-5.80 x10E6/uL Collected: 02/01/2016 11:23 AM Hemoglobin 13.6 g/dL 12.6-17.7 g/dL Collected: 02/01/2016 11:23 AM Hematocrit 38.8 % 37.5-51.0 % Collected: 02/01/2016 11:23 AM MCV 93 fL 79-97 fL Collected: 02/01/2016 11:23 AM MCH 32.7 pg 26.6-33.0 pg Collected: 02/01/2016 11:23 AM MCHC 35.1 g/dL 31.5-35.7 g/dL Collected: 02/01/2016 11:23 AM RDW 13.0 % 12.3-15.4 % Collected: 02/01/2016 11:23 AM Platelets 266 x10E3/uL 150-379 x10E3/uL Collected: 02/01/2016 11:23 AM Neutrophils 55 % Not Estab. % Collected: 02/01/2016 11:23 AM Lymphs 29 % Not Estab. % Collected: 02/01/2016 11:23 AM Monocytes 8 % Not Estab. % Collected: 02/01/2016 11:23 AM Eos 7 % Not Estab. % Collected: 02/01/2016 11:23 AM Basos 1 % Not Estab. % Collected: 02/01/2016 11:23 AM Immature Cells   Collected: 02/01/2016 11:23 AM Neutrophils (Absolute) 4.3  x10E3/uL 1.4-7.0 x10E3/uL Collected: 02/01/2016 11:23 AM Lymphs (Absolute) 2.2 x10E3/uL 0.7-3.1 x10E3/uL Collected: 02/01/2016 11:23 AM Monocytes(Absolute) 0.6 x10E3/uL 0.1-0.9 x10E3/uL Collected: 02/01/2016 11:23 AM Eos (Absolute) 0.5 x10E3/uL 0.0-0.4 x10E3/uL Collected: 02/01/2016 11:23 AM Baso (Absolute) 0.1 x10E3/uL 0.0-0.2 x10E3/uL Collected: 02/01/2016 11:23 AM Hematology Comments:   Collected: 02/01/2016 11:23 AM PT (PROTHROMBIN TIME) ([id number])   Collected: 02/01/2016 11:23 AM INR 0.9 0.8-1.2 Collected: 02/01/2016 11:23 AM Prothrombin Time 9.9 sec 9.1-12.0 sec Collected: 02/01/2016 11:23 AM  Procedures Name Value Date Myocardial perfusion imaging, tomographic (SPECT) (including attenuation correction, qualitative or quantitative wall motion, ejection fraction by first pass or gated technique, additional quantification, when performed); multiple studies, Comments: Exercise myoview stress 01/25/2016: 1. The resting electrocardiogram demonstrated normal  sinus rhythm, normal resting conduction, no resting arrhythmias and normal rest repolarization. The stress electrocardiogram revealed 3.5 mm of horizontal ST depression in lead (s):II, III, aVF, V4, V5, V6 consistent with myocardial ischemia. Patient exercised on Bruce protocol for 7:51 minutes and achieved 9.9 METS. Stress test terminated due to dyspnea and 86% MPHR achieved (Target HR >85%). 2. The perfusion imaging study demonstrates very subtle inferolateral ischemia, small duct most moderate-sized. In addition there is very subtle small sized anteroseptal ischemia extending in the distal left ventricle. Left ventricular systolic function calculated by QGS was mildly depressed at 44%. This is a high risk study, consider further cardiac work-up.  Performed: 01/27/2016 5:20 PM Echocardiography, transthoracic, real-time with image documentation (2D), includes M-mode recording,  when performed, complete, with spectral Doppler echocardiography, and with color flow Doppler echocardiography ([id number]) Comments: Echocardiogram 01/31/2016: Left ventricle cavity is normal in size. Borderline decrease in global wall motion. Visual EF is 45-50%. Left atrial cavity is mildly dilated at 4.3 cm. Mild to moderate mitral regurgitation. Mild tricuspid regurgitation. No evidence of pulmonary hypertension.  Performed: 02/01/2016 10:33 AM    Assessment & Plan Laverda Page MD; 02/02/2016 11:35 AM) Angina pectoris (I20.9) Story: Exercise myoview stress 01/25/2016: 1. The resting electrocardiogram demonstrated normal sinus rhythm, normal resting conduction, no resting arrhythmias and normal rest repolarization. The stress electrocardiogram revealed 3.5 mm of horizontal ST depression in lead (s):II, III, aVF, V4, V5, V6 consistent with myocardial ischemia. Patient exercised on Bruce protocol for 7:51 minutes and achieved 9.9 METS. Stress test terminated due to dyspnea and 86% MPHR achieved (Target HR >85%). 2. The perfusion imaging study demonstrates very subtle inferolateral ischemia, small duct most moderate-sized. In addition there is very subtle small sized anteroseptal ischemia extending in the distal left ventricle. Left ventricular systolic function calculated by QGS was mildly depressed at 44%. This is a high risk study, consider further cardiac work-up. Impression: EKG 01/15/2016: Normal sinus rhythm at rate of 66 bpm, normal axis. No evidence of ischemia, normal EKG.  Essential hypertension (I10) Story: Echocardiogram 01/31/2016: Left ventricle cavity is normal in size. Borderline decrease in global wall motion. Visual EF is 45-50%. Left atrial cavity is mildly dilated at 4.3 cm. Mild to moderate mitral regurgitation. Mild tricuspid regurgitation. No evidence of pulmonary hypertension. Hyperlipidemia, group A (E78.00) Labwork Story: Labs 02/01/2016: Serum glucose 105 mg,  nonfasting, BUN 17, serum creatinine 1.3, eGFR 60 mL, CBC normal, pro time 9.9, normal.  05/23/2015: Glucose 109, creatinine 1.04, potassium 4.3, CMP otherwise normal, TSH 0.75, free T4 normal, total cholesterol 132, triglycerides 105 and HDL 39, LDL 72 H/O: CVA (cerebrovascular accident) (Z86.73) CTA neck 07/03/2014: Gradual occlusion of the distal left vertebral artery in the upper neck to the C1 level. This more resembles sequelae of atherosclerotic disease and a focal dissection. High-grade stenosis of the left vertebral artery origin due to soft plaque. Mild right vertebral artery origin stenosis. Bilateral carotid bifurcation plaque without hemodynamically significant cervical carotid stenosis.  MRA head 07/02/2014: Left lateral mid-to suspicious for possible small acute ischemic infarct. Remote lacunar infarcts involving the bilateral thalami. Age-related atrophy.  Current Plans Mechanism of underlying disease process and action of medications discussed with the patient. I discussed primary/secondary prevention and also dietary counceling was done. Patient with high risk nuclear stress test with chest pain that is still present, I recommended that he proceed with coronary angiography. He is on appropriate medical therapy. You of the nuclear stress test, there is also suspicion of mild poststress LV dilatation  which may indicate multivessel disease.  Schedule for cardiac catheterization, and possible angioplasty. We discussed regarding risks, benefits, alternatives to this including stress testing, CTA and continued medical therapy. Patient wants to proceed. Understands <1-2% risk of death, stroke, MI, urgent CABG, bleeding, infection, renal failure but not limited to these. Video recording of the procedure shown to the patient.  CC: Aura Dials, MD  Signed by Laverda Page, MD (02/02/2016 11:36 AM)

## 2016-02-04 DIAGNOSIS — J3081 Allergic rhinitis due to animal (cat) (dog) hair and dander: Secondary | ICD-10-CM | POA: Diagnosis not present

## 2016-02-04 DIAGNOSIS — J3089 Other allergic rhinitis: Secondary | ICD-10-CM | POA: Diagnosis not present

## 2016-02-04 DIAGNOSIS — J301 Allergic rhinitis due to pollen: Secondary | ICD-10-CM | POA: Diagnosis not present

## 2016-02-05 ENCOUNTER — Encounter (HOSPITAL_COMMUNITY): Payer: Self-pay | Admitting: *Deleted

## 2016-02-05 ENCOUNTER — Ambulatory Visit (HOSPITAL_COMMUNITY)
Admission: RE | Admit: 2016-02-05 | Discharge: 2016-02-06 | Disposition: A | Discharge status: Home / Self Care | Payer: Medicare Other | Source: Ambulatory Visit | Attending: Cardiology | Admitting: Cardiology

## 2016-02-05 ENCOUNTER — Encounter (HOSPITAL_COMMUNITY)
Admission: RE | Disposition: A | Discharge status: Home / Self Care | Payer: Self-pay | Source: Ambulatory Visit | Attending: Cardiology

## 2016-02-05 DIAGNOSIS — I081 Rheumatic disorders of both mitral and tricuspid valves: Secondary | ICD-10-CM | POA: Diagnosis not present

## 2016-02-05 DIAGNOSIS — E785 Hyperlipidemia, unspecified: Secondary | ICD-10-CM | POA: Diagnosis not present

## 2016-02-05 DIAGNOSIS — Z8673 Personal history of transient ischemic attack (TIA), and cerebral infarction without residual deficits: Secondary | ICD-10-CM | POA: Diagnosis not present

## 2016-02-05 DIAGNOSIS — Z7902 Long term (current) use of antithrombotics/antiplatelets: Secondary | ICD-10-CM | POA: Diagnosis not present

## 2016-02-05 DIAGNOSIS — I672 Cerebral atherosclerosis: Secondary | ICD-10-CM | POA: Diagnosis not present

## 2016-02-05 DIAGNOSIS — Z9861 Coronary angioplasty status: Secondary | ICD-10-CM

## 2016-02-05 DIAGNOSIS — Z79899 Other long term (current) drug therapy: Secondary | ICD-10-CM | POA: Insufficient documentation

## 2016-02-05 DIAGNOSIS — I209 Angina pectoris, unspecified: Secondary | ICD-10-CM | POA: Diagnosis not present

## 2016-02-05 DIAGNOSIS — Z955 Presence of coronary angioplasty implant and graft: Secondary | ICD-10-CM

## 2016-02-05 DIAGNOSIS — Z7982 Long term (current) use of aspirin: Secondary | ICD-10-CM | POA: Diagnosis not present

## 2016-02-05 DIAGNOSIS — Z8249 Family history of ischemic heart disease and other diseases of the circulatory system: Secondary | ICD-10-CM | POA: Insufficient documentation

## 2016-02-05 DIAGNOSIS — I10 Essential (primary) hypertension: Secondary | ICD-10-CM | POA: Insufficient documentation

## 2016-02-05 DIAGNOSIS — I2584 Coronary atherosclerosis due to calcified coronary lesion: Secondary | ICD-10-CM | POA: Diagnosis not present

## 2016-02-05 DIAGNOSIS — I25119 Atherosclerotic heart disease of native coronary artery with unspecified angina pectoris: Secondary | ICD-10-CM | POA: Insufficient documentation

## 2016-02-05 HISTORY — PX: CARDIAC CATHETERIZATION: SHX172

## 2016-02-05 HISTORY — DX: Atherosclerotic heart disease of native coronary artery without angina pectoris: I25.10

## 2016-02-05 HISTORY — DX: Anxiety disorder, unspecified: F41.9

## 2016-02-05 HISTORY — DX: Cerebral infarction, unspecified: I63.9

## 2016-02-05 HISTORY — DX: Gastro-esophageal reflux disease without esophagitis: K21.9

## 2016-02-05 LAB — POCT ACTIVATED CLOTTING TIME: Activated Clotting Time: 274 seconds

## 2016-02-05 SURGERY — LEFT HEART CATH AND CORONARY ANGIOGRAPHY
Anesthesia: LOCAL

## 2016-02-05 MED ORDER — ASPIRIN EC 81 MG PO TBEC
81.0000 mg | DELAYED_RELEASE_TABLET | Freq: Every day | ORAL | Status: DC
  Administered 2016-02-06: 81 mg via ORAL
  Filled 2016-02-05: qty 1

## 2016-02-05 MED ORDER — NITROGLYCERIN 1 MG/10 ML FOR IR/CATH LAB
INTRA_ARTERIAL | Status: AC
  Filled 2016-02-05: qty 10

## 2016-02-05 MED ORDER — HEPARIN (PORCINE) IN NACL 2-0.9 UNIT/ML-% IJ SOLN
INTRAMUSCULAR | Status: AC
  Filled 2016-02-05: qty 1000

## 2016-02-05 MED ORDER — LIDOCAINE HCL (PF) 1 % IJ SOLN
INTRAMUSCULAR | Status: DC | PRN
  Administered 2016-02-05: 2 mL

## 2016-02-05 MED ORDER — HYDRALAZINE HCL 20 MG/ML IJ SOLN
10.0000 mg | INTRAMUSCULAR | Status: DC | PRN
  Administered 2016-02-05 – 2016-02-06 (×3): 10 mg via INTRAVENOUS
  Filled 2016-02-05 (×4): qty 1

## 2016-02-05 MED ORDER — SODIUM CHLORIDE 0.9% FLUSH
3.0000 mL | Freq: Two times a day (BID) | INTRAVENOUS | Status: DC
  Administered 2016-02-05: 18:00:00 3 mL via INTRAVENOUS

## 2016-02-05 MED ORDER — ASPIRIN 81 MG PO CHEW
81.0000 mg | CHEWABLE_TABLET | ORAL | Status: AC
  Administered 2016-02-05: 81 mg via ORAL

## 2016-02-05 MED ORDER — MIDAZOLAM HCL 2 MG/2ML IJ SOLN
INTRAMUSCULAR | Status: DC | PRN
  Administered 2016-02-05: 2 mg via INTRAVENOUS

## 2016-02-05 MED ORDER — VITAMIN C 500 MG PO TABS
1000.0000 mg | ORAL_TABLET | Freq: Every day | ORAL | Status: DC
  Filled 2016-02-05: qty 2

## 2016-02-05 MED ORDER — HYDROMORPHONE HCL 1 MG/ML IJ SOLN
INTRAMUSCULAR | Status: DC | PRN
  Administered 2016-02-05: 0.5 mg via INTRAVENOUS

## 2016-02-05 MED ORDER — ATORVASTATIN CALCIUM 40 MG PO TABS
40.0000 mg | ORAL_TABLET | Freq: Every day | ORAL | Status: DC
  Administered 2016-02-05: 18:00:00 40 mg via ORAL
  Filled 2016-02-05: qty 1

## 2016-02-05 MED ORDER — SODIUM CHLORIDE 0.9 % WEIGHT BASED INFUSION
3.0000 mL/kg/h | INTRAVENOUS | Status: DC
  Administered 2016-02-05: 3 mL/kg/h via INTRAVENOUS

## 2016-02-05 MED ORDER — HYDROMORPHONE HCL 1 MG/ML IJ SOLN
INTRAMUSCULAR | Status: AC
  Filled 2016-02-05: qty 1

## 2016-02-05 MED ORDER — SODIUM CHLORIDE 0.9% FLUSH: 3.0000 mL | Freq: Two times a day (BID) | INTRAVENOUS | Status: DC

## 2016-02-05 MED ORDER — TICAGRELOR 90 MG PO TABS
ORAL_TABLET | ORAL | Status: AC
  Filled 2016-02-05: qty 1

## 2016-02-05 MED ORDER — ALBUTEROL SULFATE (2.5 MG/3ML) 0.083% IN NEBU: 2.5000 mg | INHALATION_SOLUTION | Freq: Four times a day (QID) | RESPIRATORY_TRACT | Status: DC | PRN

## 2016-02-05 MED ORDER — VERAPAMIL HCL 2.5 MG/ML IV SOLN
INTRAVENOUS | Status: AC
  Filled 2016-02-05: qty 2

## 2016-02-05 MED ORDER — SODIUM CHLORIDE 0.9 % IV SOLN: 250.0000 mL | INTRAVENOUS | Status: DC | PRN

## 2016-02-05 MED ORDER — TICAGRELOR 90 MG PO TABS
ORAL_TABLET | ORAL | Status: DC | PRN
  Administered 2016-02-05: 180 mg via ORAL

## 2016-02-05 MED ORDER — AZELASTINE HCL 0.1 % NA SOLN
2.0000 | Freq: Two times a day (BID) | NASAL | Status: DC
  Administered 2016-02-05 – 2016-02-06 (×2): 2 via NASAL
  Filled 2016-02-05: qty 30

## 2016-02-05 MED ORDER — SODIUM CHLORIDE 0.9% FLUSH: 3.0000 mL | INTRAVENOUS | Status: DC | PRN

## 2016-02-05 MED ORDER — MIDAZOLAM HCL 2 MG/2ML IJ SOLN
INTRAMUSCULAR | Status: AC
  Filled 2016-02-05: qty 2

## 2016-02-05 MED ORDER — ONDANSETRON HCL 4 MG/2ML IJ SOLN
4.0000 mg | Freq: Four times a day (QID) | INTRAMUSCULAR | Status: DC | PRN
  Administered 2016-02-06: 06:00:00 4 mg via INTRAVENOUS
  Filled 2016-02-05: qty 2

## 2016-02-05 MED ORDER — HEPARIN SODIUM (PORCINE) 1000 UNIT/ML IJ SOLN
INTRAMUSCULAR | Status: AC
  Filled 2016-02-05: qty 1

## 2016-02-05 MED ORDER — METOPROLOL SUCCINATE ER 25 MG PO TB24
25.0000 mg | ORAL_TABLET | Freq: Every day | ORAL | Status: DC
  Administered 2016-02-06: 10:00:00 25 mg via ORAL
  Filled 2016-02-05: qty 1

## 2016-02-05 MED ORDER — ACETAMINOPHEN 325 MG PO TABS
650.0000 mg | ORAL_TABLET | ORAL | Status: DC | PRN
  Administered 2016-02-06: 650 mg via ORAL
  Filled 2016-02-05 (×3): qty 2

## 2016-02-05 MED ORDER — FLUTICASONE PROPIONATE 50 MCG/ACT NA SUSP
1.0000 | Freq: Every day | NASAL | Status: DC
  Filled 2016-02-05: qty 16

## 2016-02-05 MED ORDER — HEPARIN SODIUM (PORCINE) 1000 UNIT/ML IJ SOLN
INTRAMUSCULAR | Status: DC | PRN
  Administered 2016-02-05: 3000 [IU] via INTRAVENOUS
  Administered 2016-02-05: 1500 [IU] via INTRAVENOUS
  Administered 2016-02-05: 5000 [IU] via INTRAVENOUS

## 2016-02-05 MED ORDER — LEVOTHYROXINE SODIUM 112 MCG PO TABS
112.0000 ug | ORAL_TABLET | Freq: Every day | ORAL | Status: DC
Start: 2016-02-06 — End: 2016-02-06
  Administered 2016-02-06: 112 ug via ORAL
  Filled 2016-02-05: qty 1

## 2016-02-05 MED ORDER — AMLODIPINE BESYLATE 5 MG PO TABS
5.0000 mg | ORAL_TABLET | Freq: Every day | ORAL | Status: DC
  Administered 2016-02-05: 5 mg via ORAL
  Filled 2016-02-05: qty 1

## 2016-02-05 MED ORDER — ISOSORBIDE MONONITRATE ER 60 MG PO TB24
60.0000 mg | ORAL_TABLET | Freq: Every day | ORAL | Status: DC
  Administered 2016-02-06: 60 mg via ORAL
  Filled 2016-02-05: qty 1

## 2016-02-05 MED ORDER — HEPARIN (PORCINE) IN NACL 2-0.9 UNIT/ML-% IJ SOLN
INTRAMUSCULAR | Status: DC | PRN
  Administered 2016-02-05: 1000 mL

## 2016-02-05 MED ORDER — LISINOPRIL 5 MG PO TABS
5.0000 mg | ORAL_TABLET | Freq: Every day | ORAL | Status: DC
  Administered 2016-02-06: 5 mg via ORAL
  Filled 2016-02-05: qty 1

## 2016-02-05 MED ORDER — ASPIRIN 81 MG PO CHEW
CHEWABLE_TABLET | ORAL | Status: AC
  Administered 2016-02-05: 81 mg via ORAL
  Filled 2016-02-05: qty 1

## 2016-02-05 MED ORDER — VERAPAMIL HCL 2.5 MG/ML IV SOLN
INTRA_ARTERIAL | Status: DC | PRN
  Administered 2016-02-05: 15 mL via INTRA_ARTERIAL

## 2016-02-05 MED ORDER — SODIUM CHLORIDE 0.9 % WEIGHT BASED INFUSION
3.0000 mL/kg/h | INTRAVENOUS | Status: AC
  Administered 2016-02-05: 3 mL/kg/h via INTRAVENOUS

## 2016-02-05 MED ORDER — TICAGRELOR 90 MG PO TABS
90.0000 mg | ORAL_TABLET | Freq: Two times a day (BID) | ORAL | Status: DC
  Administered 2016-02-05: 21:00:00 90 mg via ORAL
  Filled 2016-02-05: qty 1

## 2016-02-05 MED ORDER — CETIRIZINE HCL 10 MG PO TABS
10.0000 mg | ORAL_TABLET | Freq: Every day | ORAL | Status: DC
Start: 2016-02-06 — End: 2016-02-06
  Administered 2016-02-06: 10 mg via ORAL
  Filled 2016-02-05: qty 1

## 2016-02-05 MED ORDER — PANTOPRAZOLE SODIUM 20 MG PO TBEC
20.0000 mg | DELAYED_RELEASE_TABLET | Freq: Two times a day (BID) | ORAL | Status: DC
  Administered 2016-02-05 – 2016-02-06 (×2): 20 mg via ORAL
  Filled 2016-02-05 (×2): qty 1

## 2016-02-05 MED ORDER — ADULT MULTIVITAMIN W/MINERALS CH
1.0000 | ORAL_TABLET | Freq: Every day | ORAL | Status: DC
  Filled 2016-02-05: qty 1

## 2016-02-05 MED ORDER — NITROGLYCERIN 0.4 MG SL SUBL: 0.4000 mg | SUBLINGUAL_TABLET | SUBLINGUAL | Status: DC | PRN

## 2016-02-05 MED ORDER — NITROGLYCERIN 1 MG/10 ML FOR IR/CATH LAB
INTRA_ARTERIAL | Status: DC | PRN
  Administered 2016-02-05: 200 ug via INTRACORONARY

## 2016-02-05 MED ORDER — LIDOCAINE HCL (PF) 1 % IJ SOLN
INTRAMUSCULAR | Status: AC
  Filled 2016-02-05: qty 30

## 2016-02-05 MED ORDER — FLUOXETINE HCL 20 MG PO CAPS
20.0000 mg | ORAL_CAPSULE | Freq: Every day | ORAL | Status: DC
  Administered 2016-02-06: 20 mg via ORAL
  Filled 2016-02-05: qty 1

## 2016-02-05 MED ORDER — SODIUM CHLORIDE 0.9 % WEIGHT BASED INFUSION
1.0000 mL/kg/h | INTRAVENOUS | Status: DC
Start: 2016-02-06 — End: 2016-02-05

## 2016-02-05 SURGICAL SUPPLY — 18 items
BALLN ANGIOSCULPT RX 2.5X6 (BALLOONS) ×2
BALLN MOZEC 2.50X14 (BALLOONS) ×2
BALLOON ANGIOSCULPT RX 2.5X6 (BALLOONS) ×1 IMPLANT
BALLOON MOZEC 2.50X14 (BALLOONS) ×1 IMPLANT
CATH HEARTRAIL IKARI 6F IR1.0 (CATHETERS) ×2 IMPLANT
CATH OPTITORQUE TIG 4.0 5F (CATHETERS) ×2 IMPLANT
DEVICE RAD COMP TR BAND LRG (VASCULAR PRODUCTS) ×2 IMPLANT
GLIDESHEATH SLEND A-KIT 6F 20G (SHEATH) ×2 IMPLANT
GUIDEWIRE INQWIRE 1.5J.035X260 (WIRE) ×1 IMPLANT
INQWIRE 1.5J .035X260CM (WIRE) ×2
KIT ENCORE 26 ADVANTAGE (KITS) ×2 IMPLANT
KIT HEART LEFT (KITS) ×2 IMPLANT
PACK CARDIAC CATHETERIZATION (CUSTOM PROCEDURE TRAY) ×2 IMPLANT
STENT XIENCE ALPINE RX 2.5X15 (Permanent Stent) ×2 IMPLANT
STENT XIENCE ALPINE RX 3.0X18 (Permanent Stent) ×2 IMPLANT
TRANSDUCER W/STOPCOCK (MISCELLANEOUS) ×2 IMPLANT
TUBING CIL FLEX 10 FLL-RA (TUBING) ×2 IMPLANT
WIRE COUGAR XT STRL 190CM (WIRE) ×2 IMPLANT

## 2016-02-05 NOTE — Interval H&P Note (Signed)
History and Physical Interval Note:  02/05/2016 9:39 AM  Daniel Quinn  has presented today for surgery, with the diagnosis of abnormal stress test  The various methods of treatment have been discussed with the patient and family. After consideration of risks, benefits and other options for treatment, the patient has consented to  Procedure(s): Left Heart Cath and Coronary Angiography (N/A) and possible angioplasty as a surgical intervention .  The patient's history has been reviewed, patient examined, no change in status, stable for surgery.  I have reviewed the patient's chart and labs.  Questions were answered to the patient's satisfaction.   Ischemic Symptoms? CCS II (Slight limitation of ordinary activity) Anti-ischemic Medical Therapy? Maximal Medical Therapy (2 or more classes of medications) Non-invasive Test Results? High-risk stress test findings: cardiac mortality >3%/yr Prior CABG? No Previous CABG   Patient Information:   1-2V CAD, no prox LAD  A (8)  Indication: 19; Score: 8   Patient Information:   CTO of 1 vessel, no other CAD  A (7)  Indication: 29; Score: 7   Patient Information:   1V CAD with prox LAD  A (9)  Indication: 35; Score: 9   Patient Information:   2V-CAD with prox LAD  A (9)  Indication: 41; Score: 9   Patient Information:   3V-CAD without LMCA  A (9)  Indication: 47; Score: 9   Patient Information:   3V-CAD without LMCA With Abnormal LV systolic function  A (9)  Indication: 48; Score: 9   Patient Information:   LMCA-CAD  A (9)  Indication: 49; Score: 9   Patient Information:   2V-CAD with prox LAD PCI  A (7)  Indication: 62; Score: 7   Patient Information:   2V-CAD with prox LAD CABG  A (8)  Indication: 62; Score: 8   Patient Information:   3V-CAD without LMCA With Low CAD burden(i.e., 3 focal stenoses, low SYNTAX score) PCI  A (7)  Indication: 63; Score: 7   Patient Information:   3V-CAD without  LMCA With Low CAD burden(i.e., 3 focal stenoses, low SYNTAX score) CABG  A (9)  Indication: 63; Score: 9   Patient Information:   3V-CAD without LMCA E06c - Intermediate-high CAD burden (i.e., multiple diffuse lesions, presence of CTO, or high SYNTAX score) PCI  U (4)  Indication: 64; Score: 4   Patient Information:   3V-CAD without LMCA E06c - Intermediate-high CAD burden (i.e., multiple diffuse lesions, presence of CTO, or high SYNTAX score) CABG  A (9)  Indication: 64; Score: 9   Patient Information:   LMCA-CAD With Isolated LMCA stenosis  PCI  U (6)  Indication: 65; Score: 6   Patient Information:   LMCA-CAD With Isolated LMCA stenosis  CABG  A (9)  Indication: 65; Score: 9   Patient Information:   LMCA-CAD Additional CAD, low CAD burden (i.e., 1- to 2-vessel additional involvement, low SYNTAX score) PCI  U (5)  Indication: 66; Score: 5   Patient Information:   LMCA-CAD Additional CAD, low CAD burden (i.e., 1- to 2-vessel additional involvement, low SYNTAX score) CABG  A (9)  Indication: 66; Score: 9   Patient Information:   LMCA-CAD Additional CAD, intermediate-high CAD burden (i.e., 3-vessel involvement, presence of CTO, or high SYNTAX score) PCI  I (3)  Indication: 67; Score: 3   Patient Information:   LMCA-CAD Additional CAD, intermediate-high CAD burden (i.e., 3-vessel involvement, presence of CTO, or high SYNTAX score) CABG  A (9)  Indication: 67; Score: 9  Adrian Prows

## 2016-02-05 NOTE — Progress Notes (Signed)
S/W JACKIE @ [facility] # [phone]   BRILINTA  90 MG BID  30/60   COVER- YES  CO-PAY- $ 67.62  PRIOR APPROVAL- NO  PHARMACY: [facility]

## 2016-02-05 NOTE — Progress Notes (Signed)
TR BAND REMOVAL  LOCATION:  right radial  DEFLATED PER PROTOCOL:  Yes.    TIME BAND OFF / DRESSING APPLIED:   1600   SITE UPON ARRIVAL:   Level 2  SITE AFTER BAND REMOVAL:  Level 2  CIRCULATION SENSATION AND MOVEMENT:  Within Normal Limits  Yes.    COMMENTS:  Hematoma 4 x 4 cm upon arrival from cath lab.  Manual pressure held X 15 minutes; coban applied.  Subsequently elastoplast applied.  Right arm maintained elevated with ice pack.  Arm stable throughout shift (1900); good pleth, oxygen sats wnl; capillary refill wnl.  Patient denies pain or numbness of RUE.

## 2016-02-05 NOTE — Care Management Note (Signed)
Case Management Note  Patient Details  Name: Daniel Quinn MRN: [id number] Date of Birth: 07-21-1945  Subjective/Objective:    S/p  Coronary stent intervention, pta indep.  NCM gave patient the 30 day savings card, [facility] [address] , [facility] do have in stock.  Patient has pcp , he has medication coverage and transportation at dc.  NCM will cont to follow for dc needs.                Action/Plan:   Expected Discharge Date:                  Expected Discharge Plan:  Home/Self Care  In-House Referral:     Discharge planning Services  CM Consult  Post Acute Care Choice:    Choice offered to:     DME Arranged:    DME Agency:     HH Arranged:    HH Agency:     Status of Service:  Completed, signed off  If discussed at [employer] of Stay Meetings, dates discussed:    Additional Comments:  [name], RN 02/05/2016, 3:58 PM

## 2016-02-06 DIAGNOSIS — I209 Angina pectoris, unspecified: Secondary | ICD-10-CM | POA: Diagnosis not present

## 2016-02-06 DIAGNOSIS — Z8673 Personal history of transient ischemic attack (TIA), and cerebral infarction without residual deficits: Secondary | ICD-10-CM | POA: Diagnosis not present

## 2016-02-06 DIAGNOSIS — Z8249 Family history of ischemic heart disease and other diseases of the circulatory system: Secondary | ICD-10-CM | POA: Diagnosis not present

## 2016-02-06 DIAGNOSIS — I672 Cerebral atherosclerosis: Secondary | ICD-10-CM | POA: Diagnosis not present

## 2016-02-06 DIAGNOSIS — I2584 Coronary atherosclerosis due to calcified coronary lesion: Secondary | ICD-10-CM | POA: Diagnosis not present

## 2016-02-06 DIAGNOSIS — Z7982 Long term (current) use of aspirin: Secondary | ICD-10-CM | POA: Diagnosis not present

## 2016-02-06 DIAGNOSIS — I10 Essential (primary) hypertension: Secondary | ICD-10-CM | POA: Diagnosis not present

## 2016-02-06 DIAGNOSIS — E785 Hyperlipidemia, unspecified: Secondary | ICD-10-CM | POA: Diagnosis not present

## 2016-02-06 DIAGNOSIS — Z79899 Other long term (current) drug therapy: Secondary | ICD-10-CM | POA: Diagnosis not present

## 2016-02-06 DIAGNOSIS — I081 Rheumatic disorders of both mitral and tricuspid valves: Secondary | ICD-10-CM | POA: Diagnosis not present

## 2016-02-06 DIAGNOSIS — Z7902 Long term (current) use of antithrombotics/antiplatelets: Secondary | ICD-10-CM | POA: Diagnosis not present

## 2016-02-06 DIAGNOSIS — I25119 Atherosclerotic heart disease of native coronary artery with unspecified angina pectoris: Secondary | ICD-10-CM | POA: Diagnosis not present

## 2016-02-06 LAB — BASIC METABOLIC PANEL
Anion gap: 10 (ref 5–15)
BUN: 16 mg/dL (ref 6–20)
CO2: 24 mmol/L (ref 22–32)
CREATININE: 1.15 mg/dL (ref 0.61–1.24)
Calcium: 8.9 mg/dL (ref 8.9–10.3)
Chloride: 104 mmol/L (ref 101–111)
Glucose, Bld: 122 mg/dL — ABNORMAL HIGH (ref 65–99)
Potassium: 3.6 mmol/L (ref 3.5–5.1)
SODIUM: 138 mmol/L (ref 135–145)

## 2016-02-06 LAB — CBC
HCT: 37.5 % — ABNORMAL LOW (ref 39.0–52.0)
Hemoglobin: 13.2 g/dL (ref 13.0–17.0)
MCH: 32.7 pg (ref 26.0–34.0)
MCHC: 35.2 g/dL (ref 30.0–36.0)
MCV: 92.8 fL (ref 78.0–100.0)
PLATELETS: 214 10*3/uL (ref 150–400)
RBC: 4.04 MIL/uL — AB (ref 4.22–5.81)
RDW: 13.1 % (ref 11.5–15.5)
WBC: 11 10*3/uL — AB (ref 4.0–10.5)

## 2016-02-06 LAB — GLUCOSE, CAPILLARY: GLUCOSE-CAPILLARY: 136 mg/dL — AB (ref 65–99)

## 2016-02-06 MED ORDER — AMLODIPINE BESYLATE 10 MG PO TABS: 10.0000 mg | ORAL_TABLET | Freq: Every day | ORAL | 1 refills | Status: DC

## 2016-02-06 MED ORDER — TICAGRELOR 90 MG PO TABS: 90.0000 mg | ORAL_TABLET | Freq: Two times a day (BID) | ORAL | 0 refills | Status: DC

## 2016-02-06 MED ORDER — AMLODIPINE BESYLATE 5 MG PO TABS: 5.0000 mg | ORAL_TABLET | Freq: Every day | ORAL | 1 refills | Status: DC

## 2016-02-06 MED ORDER — AMLODIPINE BESYLATE 10 MG PO TABS
10.0000 mg | ORAL_TABLET | Freq: Every day | ORAL | Status: DC
  Administered 2016-02-06 (×2): 10 mg via ORAL
  Filled 2016-02-06 (×2): qty 1

## 2016-02-06 MED ORDER — LISINOPRIL 20 MG PO TABS: 20.0000 mg | ORAL_TABLET | Freq: Every day | ORAL | 1 refills | Status: DC

## 2016-02-06 MED ORDER — ANGIOPLASTY BOOK
Freq: Once | Status: AC
  Administered 2016-02-06: 01:00:00 1
  Filled 2016-02-06: qty 1

## 2016-02-06 NOTE — Progress Notes (Addendum)
CARDIAC REHAB PHASE I   PRE:  Rate/Rhythm: 107 ST  BP:  Sitting: 150/78        SaO2: 98 RA  MODE:  Ambulation: 800 ft   POST:  Rate/Rhythm: 113 ST  BP:  Sitting: 164/81         SaO2: 98 RA  Pt ambulated 800 ft on RA, handheld assist, steady gait, tolerated well with no complaints. Completed PCI/stent education.  Reviewed risk factors, PCI book, anti-platelet therapy, stent card, activity restrictions, ntg, exercise, heart healthy diet, and phase 2 cardiac rehab. Pt verbalized understanding. Pt agrees to phase 2 cardiac rehab referral, will send to [facility] per pt request. Pt to return for staged PCI. Pt to edge of bed per pt request after walk, call bell within reach.   [place], RN, BSN 02/06/2016 9:18 AM

## 2016-02-06 NOTE — Progress Notes (Signed)
Pt awakened with c/o HA lightheadedness nausea and tremors of both hands CBG obtained =136 B/p 187/81 Apresoline 10mg  administered iv slowly. Cold compress applied to forehead.Call placed to Dr Einar Gip for pt status update.

## 2016-02-06 NOTE — Discharge Summary (Signed)
Physician Discharge Summary  Patient ID: Daniel Quinn MRN: [id number] DOB/AGE: [birth year] 70 y.o.  Admit date: 02/05/2016 Discharge date: 02/06/2016  Primary Discharge Diagnosis Angina Pectoris Class 3 CAD native vessel. High-grade proximal RCA 95% stenosis and a distal RCA 95% stenosis, S/P 3.0 x 18 mm and a 2.5 x 15 mm Xience Alpine DES, stenosis reduced to 0%. Has residual Proximal LAD 90% calcific stenosis, needs elective PTCA.  Secondary Discharge Diagnosis Hypertension Hyperlipidemia  Significant Diagnostic Studies: 02/05/16: 1. Normal LV systolic function, EF [id number]. 2. High-grade proximal RCA 95% stenosis and a distal RCA 95% stenosis, S/P 3.0 x 18 mm and a 2.5 x 15 mm Xience Alpine DES, stenosis reduced to 0%. 3. Significant coronary calcification involving the left coronaries and the ascending aorta evident. Left main is normal. Proximal LAD has a 90% calcific stenosis. Mild disease in the distal LAD. 4. Circumflex coronary artery proximal segment has a calcific focal 50% stenosis. Distal segment looks smooth and has mild disease.  Hospital Course: Daniel Quinn is a 70 y.o. male Patient presenting with chest pain suggestive of angina pectoris, outpatient stress test had revealed inferolateral ischemia and also anterolateral ischemia considered to be high risk. In spite of continued medical therapy, he still had angina pectoris. Hence brought to the car to His lab to evaluate his coronary anatomy. He has history of hypertension, hyperlipidemia and prior history of stroke.  Admitted to the hospital for elective angioplasty, successful angioplasty right coronary artery, the following morning patient was stable for discharge with plans on performing elective angioplasty to the LAD.  Recommendations on discharge: He was started on amlodipine for uncontrolled hypertension, discontinue Plavix and switched him to [facility] however he could not tolerate this due to marked dyspnea and  nausea and hence discontinued and plavix restarted. Lisinopril increased to 20 mg daily. Elective staged pci will be set up.   Discharge Exam: Blood pressure (!) 174/79, pulse 73, temperature 97.4 F (36.3 C), temperature source Oral, resp. rate 20, height 5\' 6"  (1.676 m), weight 82.1 kg (181 lb), SpO2 99 %.    General appearance: alert, cooperative, appears stated age and no distress Resp: clear to auscultation bilaterally Cardio: regular rate and rhythm, S1, S2 normal, no murmur, click, rub or gallop GI: soft, non-tender; bowel sounds normal; no masses,  no organomegaly Pulses: 2+ and symmetric right radial access stable. Small forearm hematoma un changed and of no consequence. Neurologic: Grossly normal Labs:   Lab Results  Component Value Date   WBC 11.0 (H) 02/06/2016   HGB 13.2 02/06/2016   HCT 37.5 (L) 02/06/2016   MCV 92.8 02/06/2016   PLT 214 02/06/2016    Recent Labs Lab 02/06/16 0251  NA 138  K 3.6  CL 104  CO2 24  BUN 16  CREATININE 1.15  CALCIUM 8.9  GLUCOSE 122*    Lipid Panel     Component Value Date/Time   CHOL 210 (H) 07/02/2014 0658   TRIG 160 (H) 07/02/2014 0658   HDL 44 07/02/2014 0658   CHOLHDL 4.8 07/02/2014 0658   VLDL 32 07/02/2014 0658   LDLCALC 134 (H) 07/02/2014 0658   HEMOGLOBIN A1C Lab Results  Component Value Date   HGBA1C 6.0 (H) 07/02/2014   MPG 126 07/02/2014   EKG: normal EKG, normal sinus rhythm, unchanged from previous tracings.   FOLLOW UP PLANS AND APPOINTMENTS    Medication List    TAKE these medications   amLODipine 10 MG tablet Commonly known as:  NORVASC Take 1  tablet (10 mg total) by mouth daily.   aspirin 81 MG EC tablet Take 1 tablet (81 mg total) by mouth daily. Stop Asprin in 3 months   atorvastatin 40 MG tablet Commonly known as:  LIPITOR Take 1 tablet (40 mg total) by mouth daily at 6 PM.   azelastine 0.1 % nasal spray Commonly known as:  ASTELIN Place 2 sprays into both nostrils 2 (two) times  daily. Use in each nostril as directed   clopidogrel 75 MG tablet Commonly known as:  PLAVIX Take 1 tablet (75 mg total) by mouth daily.   FLUoxetine 20 MG capsule Commonly known as:  PROZAC Take 20 mg by mouth daily.   fluticasone 50 MCG/ACT nasal spray Commonly known as:  FLONASE Place 1 spray into both nostrils daily.   isosorbide mononitrate 60 MG 24 hr tablet Commonly known as:  IMDUR Take 60 mg by mouth daily.   levocetirizine 5 MG tablet Commonly known as:  XYZAL Take 5 mg by mouth daily.   levothyroxine 112 MCG tablet Commonly known as:  SYNTHROID, LEVOTHROID Take 112 mcg by mouth daily before breakfast.   lisinopril 20 MG tablet Commonly known as:  PRINIVIL,ZESTRIL Take 1 tablet (20 mg total) by mouth daily. What changed:  medication strength  how much to take   metoprolol succinate 25 MG 24 hr tablet Commonly known as:  TOPROL-XL Take 25 mg by mouth daily.   multivitamin with minerals Tabs tablet Take 1 tablet by mouth daily.   nitroGLYCERIN 0.4 MG SL tablet Commonly known as:  NITROSTAT Place 0.4 mg under the tongue every 5 (five) minutes as needed for chest pain.   pantoprazole 20 MG tablet Commonly known as:  PROTONIX Take 20 mg by mouth 2 (two) times daily.   PROVENTIL HFA 108 (90 Base) MCG/ACT inhaler Generic drug:  albuterol Inhale 1-2 puffs into the lungs every 4 (four) hours as needed.   vitamin C 1000 MG tablet Take 1,000 mg by mouth daily.      Follow-up Information    Adrian Prows, MD Follow up.   Specialty:  Cardiology Why:  Keep previous appointment provided. We will set up out patient angioplasty for you. Contact information: [address] [place]  [address] [phone]            Adrian Prows, MD 02/06/2016, 5:59 AM  Pager: [phone] Office: [phone] If no answer: [phone]

## 2016-02-06 NOTE — Progress Notes (Signed)
D/C instructions and radial precautions reviewed w/ pt, handouts given, questions answered.  Medicated with tylenol for HA, "feels better" per pt down to 5 from 7.   Brilinta not given due to d/c'd by Dr. Einar Gip and changed to plavix. Pt states he already has Plavix at home and voices understanding that he needs to take Plavix at home this am.  IV/Tele removed by NT Barb, to front door per w/c with volunteer.

## 2016-02-12 DIAGNOSIS — M5136 Other intervertebral disc degeneration, lumbar region: Secondary | ICD-10-CM | POA: Diagnosis not present

## 2016-02-12 DIAGNOSIS — M9903 Segmental and somatic dysfunction of lumbar region: Secondary | ICD-10-CM | POA: Diagnosis not present

## 2016-02-12 DIAGNOSIS — J3089 Other allergic rhinitis: Secondary | ICD-10-CM | POA: Diagnosis not present

## 2016-02-12 DIAGNOSIS — M545 Low back pain: Secondary | ICD-10-CM | POA: Diagnosis not present

## 2016-02-12 DIAGNOSIS — J301 Allergic rhinitis due to pollen: Secondary | ICD-10-CM | POA: Diagnosis not present

## 2016-02-12 DIAGNOSIS — M62830 Muscle spasm of back: Secondary | ICD-10-CM | POA: Diagnosis not present

## 2016-02-12 DIAGNOSIS — J3081 Allergic rhinitis due to animal (cat) (dog) hair and dander: Secondary | ICD-10-CM | POA: Diagnosis not present

## 2016-02-18 DIAGNOSIS — J3081 Allergic rhinitis due to animal (cat) (dog) hair and dander: Secondary | ICD-10-CM | POA: Diagnosis not present

## 2016-02-18 DIAGNOSIS — J301 Allergic rhinitis due to pollen: Secondary | ICD-10-CM | POA: Diagnosis not present

## 2016-02-18 DIAGNOSIS — J3089 Other allergic rhinitis: Secondary | ICD-10-CM | POA: Diagnosis not present

## 2016-02-20 DIAGNOSIS — I209 Angina pectoris, unspecified: Secondary | ICD-10-CM | POA: Diagnosis not present

## 2016-02-25 DIAGNOSIS — J301 Allergic rhinitis due to pollen: Secondary | ICD-10-CM | POA: Diagnosis not present

## 2016-02-25 DIAGNOSIS — J3081 Allergic rhinitis due to animal (cat) (dog) hair and dander: Secondary | ICD-10-CM | POA: Diagnosis not present

## 2016-02-25 DIAGNOSIS — J3089 Other allergic rhinitis: Secondary | ICD-10-CM | POA: Diagnosis not present

## 2016-02-26 ENCOUNTER — Encounter (HOSPITAL_COMMUNITY): Payer: Self-pay | Admitting: General Practice

## 2016-02-26 ENCOUNTER — Encounter (HOSPITAL_COMMUNITY)
Admission: RE | Disposition: A | Discharge status: Home / Self Care | Payer: Self-pay | Source: Ambulatory Visit | Attending: Cardiology

## 2016-02-26 ENCOUNTER — Ambulatory Visit (HOSPITAL_COMMUNITY)
Admission: RE | Admit: 2016-02-26 | Discharge: 2016-02-27 | Disposition: A | Discharge status: Home / Self Care | Payer: Medicare Other | Source: Ambulatory Visit | Attending: Cardiology | Admitting: Cardiology

## 2016-02-26 DIAGNOSIS — I6502 Occlusion and stenosis of left vertebral artery: Secondary | ICD-10-CM | POA: Insufficient documentation

## 2016-02-26 DIAGNOSIS — Z8673 Personal history of transient ischemic attack (TIA), and cerebral infarction without residual deficits: Secondary | ICD-10-CM | POA: Diagnosis not present

## 2016-02-26 DIAGNOSIS — Z955 Presence of coronary angioplasty implant and graft: Secondary | ICD-10-CM | POA: Diagnosis not present

## 2016-02-26 DIAGNOSIS — I081 Rheumatic disorders of both mitral and tricuspid valves: Secondary | ICD-10-CM | POA: Insufficient documentation

## 2016-02-26 DIAGNOSIS — I97630 Postprocedural hematoma of a circulatory system organ or structure following a cardiac catheterization: Secondary | ICD-10-CM | POA: Insufficient documentation

## 2016-02-26 DIAGNOSIS — I209 Angina pectoris, unspecified: Secondary | ICD-10-CM | POA: Diagnosis present

## 2016-02-26 DIAGNOSIS — Z8249 Family history of ischemic heart disease and other diseases of the circulatory system: Secondary | ICD-10-CM | POA: Insufficient documentation

## 2016-02-26 DIAGNOSIS — I10 Essential (primary) hypertension: Secondary | ICD-10-CM | POA: Diagnosis not present

## 2016-02-26 DIAGNOSIS — Z9861 Coronary angioplasty status: Secondary | ICD-10-CM

## 2016-02-26 DIAGNOSIS — I25110 Atherosclerotic heart disease of native coronary artery with unstable angina pectoris: Secondary | ICD-10-CM | POA: Diagnosis not present

## 2016-02-26 DIAGNOSIS — I25119 Atherosclerotic heart disease of native coronary artery with unspecified angina pectoris: Secondary | ICD-10-CM | POA: Insufficient documentation

## 2016-02-26 DIAGNOSIS — Y840 Cardiac catheterization as the cause of abnormal reaction of the patient, or of later complication, without mention of misadventure at the time of the procedure: Secondary | ICD-10-CM | POA: Insufficient documentation

## 2016-02-26 DIAGNOSIS — E785 Hyperlipidemia, unspecified: Secondary | ICD-10-CM | POA: Diagnosis not present

## 2016-02-26 DIAGNOSIS — Z823 Family history of stroke: Secondary | ICD-10-CM | POA: Insufficient documentation

## 2016-02-26 HISTORY — PX: CORONARY STENT PLACEMENT: SHX1402

## 2016-02-26 HISTORY — PX: CARDIAC CATHETERIZATION: SHX172

## 2016-02-26 LAB — POCT ACTIVATED CLOTTING TIME
ACTIVATED CLOTTING TIME: 224 s
ACTIVATED CLOTTING TIME: 296 s
Activated Clotting Time: 235 seconds
Activated Clotting Time: 268 seconds

## 2016-02-26 LAB — PLATELET INHIBITION P2Y12: Platelet Function  P2Y12: 307 [PRU] (ref 194–418)

## 2016-02-26 SURGERY — CORONARY STENT INTERVENTION
Anesthesia: LOCAL

## 2016-02-26 MED ORDER — ASPIRIN 81 MG PO CHEW: 81.0000 mg | CHEWABLE_TABLET | ORAL | Status: DC

## 2016-02-26 MED ORDER — SODIUM CHLORIDE 0.9 % IV SOLN: 250.0000 mL | INTRAVENOUS | Status: DC | PRN

## 2016-02-26 MED ORDER — HEPARIN SODIUM (PORCINE) 1000 UNIT/ML IJ SOLN
INTRAMUSCULAR | Status: AC
  Filled 2016-02-26: qty 1

## 2016-02-26 MED ORDER — FLUOXETINE HCL 20 MG PO CAPS
20.0000 mg | ORAL_CAPSULE | Freq: Every day | ORAL | Status: DC
  Filled 2016-02-26 (×2): qty 1

## 2016-02-26 MED ORDER — LIDOCAINE HCL (PF) 1 % IJ SOLN
INTRAMUSCULAR | Status: AC
  Filled 2016-02-26: qty 30

## 2016-02-26 MED ORDER — VERAPAMIL HCL 2.5 MG/ML IV SOLN
INTRAVENOUS | Status: AC
  Filled 2016-02-26: qty 2

## 2016-02-26 MED ORDER — HEPARIN SODIUM (PORCINE) 1000 UNIT/ML IJ SOLN
INTRAMUSCULAR | Status: DC | PRN
  Administered 2016-02-26: 3000 [IU] via INTRAVENOUS
  Administered 2016-02-26: 1000 [IU] via INTRAVENOUS
  Administered 2016-02-26: 6000 [IU] via INTRAVENOUS

## 2016-02-26 MED ORDER — NITROGLYCERIN 0.4 MG SL SUBL: 0.4000 mg | SUBLINGUAL_TABLET | SUBLINGUAL | Status: DC | PRN

## 2016-02-26 MED ORDER — NITROGLYCERIN 1 MG/10 ML FOR IR/CATH LAB
INTRA_ARTERIAL | Status: AC
  Filled 2016-02-26: qty 10

## 2016-02-26 MED ORDER — ACETAMINOPHEN 325 MG PO TABS: 650.0000 mg | ORAL_TABLET | Freq: Four times a day (QID) | ORAL | Status: DC | PRN

## 2016-02-26 MED ORDER — IOPAMIDOL (ISOVUE-370) INJECTION 76%
INTRAVENOUS | Status: AC
  Filled 2016-02-26: qty 125

## 2016-02-26 MED ORDER — HEPARIN (PORCINE) IN NACL 2-0.9 UNIT/ML-% IJ SOLN
INTRAMUSCULAR | Status: DC | PRN
  Administered 2016-02-26: 1000 mL

## 2016-02-26 MED ORDER — ATORVASTATIN CALCIUM 40 MG PO TABS
40.0000 mg | ORAL_TABLET | Freq: Every day | ORAL | Status: DC
  Administered 2016-02-26: 19:00:00 40 mg via ORAL
  Filled 2016-02-26: qty 1

## 2016-02-26 MED ORDER — VITAMIN C 500 MG PO TABS
1000.0000 mg | ORAL_TABLET | Freq: Every day | ORAL | Status: DC
  Filled 2016-02-26: qty 2

## 2016-02-26 MED ORDER — LABETALOL HCL 5 MG/ML IV SOLN: 10.0000 mg | INTRAVENOUS | Status: AC | PRN

## 2016-02-26 MED ORDER — SODIUM CHLORIDE 0.9 % WEIGHT BASED INFUSION: 1.0000 mL/kg/h | INTRAVENOUS | Status: DC

## 2016-02-26 MED ORDER — SODIUM CHLORIDE 0.9% FLUSH: 3.0000 mL | Freq: Two times a day (BID) | INTRAVENOUS | Status: DC

## 2016-02-26 MED ORDER — LISINOPRIL 20 MG PO TABS
20.0000 mg | ORAL_TABLET | Freq: Every day | ORAL | Status: DC
  Filled 2016-02-26: qty 2
  Filled 2016-02-26: qty 1

## 2016-02-26 MED ORDER — METOPROLOL SUCCINATE ER 25 MG PO TB24
25.0000 mg | ORAL_TABLET | Freq: Every day | ORAL | Status: DC
  Filled 2016-02-26: qty 1

## 2016-02-26 MED ORDER — MIDAZOLAM HCL 2 MG/2ML IJ SOLN
INTRAMUSCULAR | Status: AC
  Filled 2016-02-26: qty 2

## 2016-02-26 MED ORDER — TICAGRELOR 90 MG PO TABS: 90.0000 mg | ORAL_TABLET | ORAL | Status: DC

## 2016-02-26 MED ORDER — HYDROMORPHONE HCL 1 MG/ML IJ SOLN
INTRAMUSCULAR | Status: DC | PRN
  Administered 2016-02-26: 0.5 mg via INTRAVENOUS

## 2016-02-26 MED ORDER — AZELASTINE HCL 0.1 % NA SOLN
2.0000 | Freq: Two times a day (BID) | NASAL | Status: DC
Start: 2016-02-26 — End: 2016-02-27
  Administered 2016-02-26: 2 via NASAL
  Filled 2016-02-26: qty 30

## 2016-02-26 MED ORDER — ONDANSETRON HCL 4 MG/2ML IJ SOLN: 4.0000 mg | Freq: Four times a day (QID) | INTRAMUSCULAR | Status: DC | PRN

## 2016-02-26 MED ORDER — HYDRALAZINE HCL 20 MG/ML IJ SOLN: 5.0000 mg | INTRAMUSCULAR | Status: AC | PRN

## 2016-02-26 MED ORDER — HEPARIN (PORCINE) IN NACL 2-0.9 UNIT/ML-% IJ SOLN
INTRAMUSCULAR | Status: AC
  Filled 2016-02-26: qty 1000

## 2016-02-26 MED ORDER — ASPIRIN EC 81 MG PO TBEC
81.0000 mg | DELAYED_RELEASE_TABLET | Freq: Every day | ORAL | Status: DC
Start: 2016-02-26 — End: 2016-02-27
  Filled 2016-02-26: qty 1

## 2016-02-26 MED ORDER — FLUTICASONE PROPIONATE 50 MCG/ACT NA SUSP
1.0000 | Freq: Every day | NASAL | Status: DC
Start: 2016-02-26 — End: 2016-02-27
  Filled 2016-02-26: qty 16

## 2016-02-26 MED ORDER — SODIUM CHLORIDE 0.9% FLUSH: 3.0000 mL | INTRAVENOUS | Status: DC | PRN

## 2016-02-26 MED ORDER — IOPAMIDOL (ISOVUE-370) INJECTION 76%
INTRAVENOUS | Status: AC
  Filled 2016-02-26: qty 50

## 2016-02-26 MED ORDER — NITROGLYCERIN 1 MG/10 ML FOR IR/CATH LAB
INTRA_ARTERIAL | Status: DC | PRN
  Administered 2016-02-26: 200 ug

## 2016-02-26 MED ORDER — HYDROMORPHONE HCL 1 MG/ML IJ SOLN
INTRAMUSCULAR | Status: AC
  Filled 2016-02-26: qty 1

## 2016-02-26 MED ORDER — SODIUM CHLORIDE 0.9 % WEIGHT BASED INFUSION
3.0000 mL/kg/h | INTRAVENOUS | Status: DC
  Administered 2016-02-26: 3 mL/kg/h via INTRAVENOUS

## 2016-02-26 MED ORDER — LORATADINE 10 MG PO TABS
10.0000 mg | ORAL_TABLET | Freq: Every day | ORAL | Status: DC
Start: 2016-02-26 — End: 2016-02-27
  Filled 2016-02-26: qty 1

## 2016-02-26 MED ORDER — ADULT MULTIVITAMIN W/MINERALS CH
1.0000 | ORAL_TABLET | Freq: Every day | ORAL | Status: DC
  Filled 2016-02-26: qty 1

## 2016-02-26 MED ORDER — PANTOPRAZOLE SODIUM 20 MG PO TBEC
20.0000 mg | DELAYED_RELEASE_TABLET | Freq: Two times a day (BID) | ORAL | Status: DC
  Administered 2016-02-26: 22:00:00 20 mg via ORAL
  Filled 2016-02-26 (×2): qty 1

## 2016-02-26 MED ORDER — ISOSORBIDE MONONITRATE ER 60 MG PO TB24
60.0000 mg | ORAL_TABLET | Freq: Every day | ORAL | Status: DC
  Filled 2016-02-26: qty 1

## 2016-02-26 MED ORDER — LIDOCAINE HCL (PF) 1 % IJ SOLN
INTRAMUSCULAR | Status: DC | PRN
  Administered 2016-02-26: 7.5 mL via INTRA_ARTERIAL

## 2016-02-26 MED ORDER — LEVOTHYROXINE SODIUM 112 MCG PO TABS
112.0000 ug | ORAL_TABLET | Freq: Every day | ORAL | Status: DC
  Administered 2016-02-27: 08:00:00 112 ug via ORAL
  Filled 2016-02-26: qty 1

## 2016-02-26 MED ORDER — MIDAZOLAM HCL 2 MG/2ML IJ SOLN
INTRAMUSCULAR | Status: DC | PRN
  Administered 2016-02-26: 2 mg via INTRAVENOUS
  Administered 2016-02-26: 1 mg via INTRAVENOUS

## 2016-02-26 MED ORDER — SODIUM CHLORIDE 0.9 % WEIGHT BASED INFUSION: 1.0000 mL/kg/h | INTRAVENOUS | Status: AC

## 2016-02-26 MED ORDER — LIDOCAINE HCL (PF) 1 % IJ SOLN
INTRAMUSCULAR | Status: DC | PRN
  Administered 2016-02-26: 2 mL

## 2016-02-26 MED ORDER — AMLODIPINE BESYLATE 5 MG PO TABS
10.0000 mg | ORAL_TABLET | Freq: Every day | ORAL | Status: DC
  Filled 2016-02-26: qty 2

## 2016-02-26 MED ORDER — CLOPIDOGREL BISULFATE 75 MG PO TABS
75.0000 mg | ORAL_TABLET | Freq: Every day | ORAL | Status: DC
Start: 2016-02-26 — End: 2016-02-27
  Filled 2016-02-26: qty 1

## 2016-02-26 MED ORDER — IOPAMIDOL (ISOVUE-370) INJECTION 76%
INTRAVENOUS | Status: AC
  Filled 2016-02-26: qty 100

## 2016-02-26 MED ORDER — CLOPIDOGREL BISULFATE 300 MG PO TABS
ORAL_TABLET | ORAL | Status: AC
  Filled 2016-02-26: qty 1

## 2016-02-26 MED ORDER — ALBUTEROL SULFATE (2.5 MG/3ML) 0.083% IN NEBU: 2.5000 mg | INHALATION_SOLUTION | RESPIRATORY_TRACT | Status: DC | PRN

## 2016-02-26 SURGICAL SUPPLY — 21 items
BALLN EUPHORA RX 2.5X15 (BALLOONS) ×2
BALLN MINITREK OTW 1.20X6 (BALLOONS) ×2
BALLOON EUPHORA RX 2.5X15 (BALLOONS) ×1 IMPLANT
BALLOON MINITREK OTW 1.20X6 (BALLOONS) ×1 IMPLANT
CATH VISTA GUIDE 6FR XB3.5 (CATHETERS) ×2 IMPLANT
CROWN DIAMONDBACK CLASSIC 1.25 (BURR) ×2 IMPLANT
DEVICE RAD COMP TR BAND LRG (VASCULAR PRODUCTS) ×2 IMPLANT
GLIDESHEATH SLEND A-KIT 6F 20G (SHEATH) ×2 IMPLANT
GUIDEWIRE INQWIRE 1.5J.035X260 (WIRE) ×1 IMPLANT
INQWIRE 1.5J .035X260CM (WIRE) ×2
KIT ENCORE 26 ADVANTAGE (KITS) ×2 IMPLANT
KIT HEART LEFT (KITS) ×2 IMPLANT
LUBRICANT VIPERSLIDE CORONARY (MISCELLANEOUS) ×2 IMPLANT
PACK CARDIAC CATHETERIZATION (CUSTOM PROCEDURE TRAY) ×2 IMPLANT
STENT RESOLUTE ONYX 3.0X12 (Permanent Stent) ×2 IMPLANT
STENT RESOLUTE ONYX 3.0X18 (Permanent Stent) ×2 IMPLANT
STENT XIENCE ALPINE RX 3.0X15 (Permanent Stent) ×2 IMPLANT
TRANSDUCER W/STOPCOCK (MISCELLANEOUS) ×2 IMPLANT
TUBING CIL FLEX 10 FLL-RA (TUBING) ×2 IMPLANT
WIRE COUGAR XT STRL 190CM (WIRE) ×2 IMPLANT
WIRE VIPER ADVANCE COR .012TIP (WIRE) ×2 IMPLANT

## 2016-02-26 NOTE — Interval H&P Note (Signed)
History and Physical Interval Note:  02/26/2016 10:09 AM  Daniel Quinn  has presented today for surgery, with the diagnosis of CAD  The various methods of treatment have been discussed with the patient and family. After consideration of risks, benefits and other options for treatment, the patient has consented to  Procedure(s): Coronary Stent Intervention (N/A) as a surgical intervention .  The patient's history has been reviewed, patient examined, no change in status, stable for surgery.  I have reviewed the patient's chart and labs.  Questions were answered to the patient's satisfaction.   Please see previous SCAII criteria documentation  Adrian Prows

## 2016-02-26 NOTE — Progress Notes (Signed)
Pt is in cath lab holding awaiting 6 central to receive.

## 2016-02-26 NOTE — H&P (View-Only) (Signed)
OFFICE VISIT NOTES COPIED TO EPIC FOR DOCUMENTATION  . History of Present Illness Laverda Page MD; 02/02/2016 11:24 AM) Patient words: Last O/V 01/15/2016; F/U Nuc, Echo results.  The patient is a 70 year old male who presents for a Follow-up for Angina. Patient was seen by me 3 weeks ago with symptoms suggestive of angina pectoris, he underwent stress testing on 01/25/2016 and also echocardiogram on 01/31/2016, revealing inferior lateral ischemia and mid to distal anterolateral ischemia.  I started him on metoprolol and isosorbide mononitrate which is tolerating. He has started to feel well. He still afraid to push himself due to recurrence of angina pectoris. He has not used any sublingual nitroglycerin.  Notes occasional swelling in right foot. Denies diaphoresis, paroxysmal nocturnal dysnea, current chest pain or pressure. He does not smoke. His factors include hypertension, hyperlipidemia and known cerebral atherosclerosis and male is greater than 65 years.  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 Has residual sensory defect with inability to appreciate hot or cold on right side and also has slight limp on right when tired. He was seen by Dr. Trula Slade on 07/12/2014 for left vertebral artery stenosis noted during CVA workup who recommended medical therapy with f/u carotid duplex to be scheduled in April 2017 with neurology, however, was last seen by neurology in June 2016 and never returned for follow up.   Additional reasons for visit:  Follow-up for Shortness of breath    Problem List/Past Medical (April Garrison; 02/26/16 9:55 AM) Worthy Keeler  05/23/2015: Glucose 109, creatinine 1.04, potassium 4.3, CMP otherwise normal, TSH 0.75, free T4 normal, total cholesterol 132, triglycerides 105 and HDL 39, LDL 72 Angina pectoris (I20.9)  Exercise myoview stress 01/25/2016: 1. The resting electrocardiogram  demonstrated normal sinus rhythm, normal resting conduction, no resting arrhythmias and normal rest repolarization. The stress electrocardiogram revealed 3.5 mm of horizontal ST depression in lead (s):II, III, aVF, V4, V5, V6 consistent with myocardial ischemia. Patient exercised on Bruce protocol for 7:51 minutes and achieved 9.9 METS. Stress test terminated due to dyspnea and 86% MPHR achieved (Target HR >85%). 2. The perfusion imaging study demonstrates very subtle inferolateral ischemia, small duct most moderate-sized. In addition there is very subtle small sized anteroseptal ischemia extending in the distal left ventricle. Left ventricular systolic function calculated by QGS was mildly depressed at 44%. This is a high risk study, consider further cardiac work-up. Essential hypertension (I10)  Hyperlipidemia, group A (E78.00)  H/O: CVA (cerebrovascular accident) (Z86.73)  Echocardiogram 07/03/2014: LVEF 55-60%, left atrium mildly dilated. CTA neck 07/03/2014: Gradual occlusion of the distal left vertebral artery in the upper neck to the C1 level. This more resembles sequelae of atherosclerotic disease and a focal dissection. High-grade stenosis of the left vertebral artery origin due to soft plaque. Mild right vertebral artery origin stenosis. Bilateral carotid bifurcation plaque without hemodynamically significant cervical carotid stenosis. MRA head 07/02/2014: Left lateral mid-to suspicious for possible small acute ischemic infarct. Remote lacunar infarcts involving the bilateral thalami. Age-related atrophy. Shortness of breath (R06.02)   Allergies (April Garrison; 02/26/16 9:55 AM) No Known Drug Allergies 01/15/2016  Family History (April Garrison; 2016-02-26 9:55 AM) Father  Deceased. at age 90; Hx of HTN, no heart attack or strokes, 1 stent but no other cardiovascular conditions Mother  Deceased. at age 24 from cerebral hemorrhage; Hx of HTN, CAD but no overt attacks or strokes Brother 2   1-Younger, has COPD; 1-Older  Social History (April Louretta Shorten; Feb 10, 2016 9:55 AM) Current tobacco use  Never smoker. Alcohol Use  Moderate alcohol use. Marital status  Widowed. Number of Children  2. Living Situation  Lives alone.  Past Surgical History (April Louretta Shorten; February 10, 2016 9:55 AM) Deviated Nose Septum Surgery 2007  Medication History (April Garrison; 2016-02-10 10:10 AM) Nitrostat (0.4MG Tab Sublingual, 1 (one) Tablet Sublingual every 5 minutes as needed for chest pain., Taken starting 01/15/2016) Active. Isosorbide Mononitrate ER (60MG Tablet ER 24HR, 1 (one) Tablet Oral daily, Taken starting 01/15/2016) Active. Metoprolol Succinate ER (25MG Tablet ER 24HR, 1 (one) Tablet Oral daily, Taken starting 01/15/2016) Active. Azelastine HCl (0.1% Solution, 1 puff each nostril Nasal two times daily) Active. Fluticasone Propionate (50MCG/ACT Suspension, 1 spray each nostril Nasal daily) Active. Levocetirizine Dihydrochloride (5MG Tablet, 1 Oral daily) Active. FLUoxetine HCl (20MG Capsule, 1 Oral daily) Active. Atorvastatin Calcium (40MG Tablet, 1 Oral daily) Active. Pantoprazole Sodium (20MG Tablet DR, 2 Oral daily) Active. Levothyroxine Sodium (112MCG Tablet, 1 Oral daily) Active. Lisinopril (5MG Tablet, 1 Oral daily) Active. Clopidogrel Bisulfate (75MG Tablet, 1 Oral daily) Active. [facility] Aspirin Low Dose (81MG Tablet DR, 1 Oral daily) Active. Vitamin C (1000MG Tablet, 1 Oral daily) Active. Multiple Vitamin (1 (one) Oral daily) Active. Medications Reconciled (verbally with pt/ list present)  Diagnostic Studies History (April Louretta Shorten; 02/10/16 9:58 AM) Endoscopy 1997 Colonoscopy 2011 MRA Head 07/02/2014 Left lateral mid-to suspicious for possible small acute ischemic infarct. Remote lacunar infarcts involving the bilateral thalami. Age-related atrophy. CTA neck 07/03/2014 Gradual occlusion of the distal left vertebral artery in the upper neck to the C1  level. This more resembles sequelae of atherosclerotic disease and a focal dissection. High-grade stenosis of the left vertebral artery origin due to soft plaque. Mild right vertebral artery origin stenosis. Bilateral carotid bifurcation plaque without hemodynamically significant cervical carotid stenosis. Echocardiogram 07/03/2014 LVEF 55-60%, left atrium mildly dilated. Nuclear stress test 01/25/2016 1. The resting electrocardiogram demonstrated normal sinus rhythm, normal resting conduction, no resting arrhythmias and normal rest repolarization. The stress electrocardiogram revealed 3.5 mm of horizontal ST depression in lead (s):II, III, aVF, V4, V5, V6 consistent with myocardial ischemia. Patient exercised on Bruce protocol for 7:51 minutes and achieved 9.9 METS. Stress test terminated due to dyspnea and 86% MPHR achieved (Target HR >85%). 2. The perfusion imaging study demonstrates very subtle inferolateral ischemia, small duct most moderate-sized. In addition there is very subtle small sized anteroseptal ischemia extending in the distal left ventricle. Left ventricular systolic function calculated by QGS was mildly depressed at 44%. This is a high risk study, consider further cardiac work-up.    Review of Systems Laverda Page, MD; 02/02/2016 11:35 AM) General Not Present- Anorexia, Fatigue and Fever. Respiratory Present- Difficulty Breathing on Exertion. Not Present- Hemoptysis, Wakes up from Sleep Wheezing or Short of Breath and Wheezing. Cardiovascular Present- Chest Pain. Not Present- Claudications, Edema, Orthopnea and Paroxysmal Nocturnal Dyspnea. Gastrointestinal Not Present- Black, Tarry Stool, Change in Bowel Habits and Nausea. Neurological Present- Dysesthesia (Right side of the body). Not Present- Focal Neurological Symptoms and Syncope. Endocrine Not Present- Cold Intolerance, Excessive Sweating, Heat Intolerance and Thyroid Problems. Hematology Not Present- Anemia, Easy  Bruising, Petechiae and Prolonged Bleeding.  Vitals (April Garrison; 2016-02-10 10:12 AM) 10-Feb-2016 10:04 AM Weight: 185.44 lb Height: 66in Body Surface Area: 1.94 m Body Mass Index: 29.93 kg/m  Pulse: 66 (Regular)  P.OX: 98% (Room air) BP: 128/62 (Sitting, Left Arm, Standard)       Physical Exam Laverda Page, MD; 02/02/2016 11:35  AM) General Mental Status-Alert. General Appearance-Cooperative and Appears stated age. Build & Nutrition-Well built and Well nourished(overweight).  Head and Neck Thyroid Gland Characteristics - normal size and consistency and no palpable nodules.  Chest and Lung Exam Chest and lung exam reveals -quiet, even and easy respiratory effort with no use of accessory muscles, non-tender and on auscultation, normal breath sounds, no adventitious sounds.  Cardiovascular Cardiovascular examination reveals -normal heart sounds, regular rate and rhythm with no murmurs, carotid auscultation reveals no bruits, abdominal aorta auscultation reveals no bruits and no prominent pulsation, femoral artery auscultation bilaterally reveals normal pulses, no bruits, no thrills, normal pedal pulses bilaterally and no digital clubbing, cyanosis, edema, increased warmth or tenderness.  Abdomen Palpation/Percussion Normal exam - Non Tender and No hepatosplenomegaly.  Neurologic Neurologic evaluation reveals -alert and oriented x 3 with no impairment of recent or remote memory. Motor-Grossly intact without any focal deficits.  Musculoskeletal Global Assessment Left Lower Extremity - no deformities, masses or tenderness, no known fractures. Right Lower Extremity - no deformities, masses or tenderness, no known fractures.   Results Laverda Page MD; 02/02/2016 11:35 AM) Labs  Name Value Range Date METABOLIC PANEL, BASIC (70340)   Collected: 02/01/2016 11:23 AM Glucose, Serum 105 mg/dL 65-99 mg/dL Collected:  02/01/2016 11:23 AM BUN 17 mg/dL 8-27 mg/dL Collected: 02/01/2016 11:23 AM Creatinine, Serum 1.23 mg/dL 0.76-1.27 mg/dL Collected: 02/01/2016 11:23 AM eGFR If NonAfricn Am 60 mL/min/1.73 >59 mL/min/1.73 Collected: 02/01/2016 11:23 AM eGFR If Africn Am 70 mL/min/1.73 >59 mL/min/1.73 Collected: 02/01/2016 11:23 AM BUN/Creatinine Ratio 14 10-24 Collected: 02/01/2016 11:23 AM Sodium, Serum 136 mmol/L 134-144 mmol/L Collected: 02/01/2016 11:23 AM Potassium, Serum 4.9 mmol/L 3.5-5.2 mmol/L Collected: 02/01/2016 11:23 AM Chloride, Serum 100 mmol/L 96-106 mmol/L Collected: 02/01/2016 11:23 AM Carbon Dioxide, Total 31 mmol/L 18-29 mmol/L Collected: 02/01/2016 11:23 AM Calcium, Serum 9.2 mg/dL 8.6-10.2 mg/dL Collected: 02/01/2016 11:23 AM CBC & PLATELETS (AUTO) ([id number])   Collected: 02/01/2016 11:23 AM WBC 7.7 x10E3/uL 3.4-10.8 x10E3/uL Collected: 02/01/2016 11:23 AM RBC 4.16 x10E6/uL 4.14-5.80 x10E6/uL Collected: 02/01/2016 11:23 AM Hemoglobin 13.6 g/dL 12.6-17.7 g/dL Collected: 02/01/2016 11:23 AM Hematocrit 38.8 % 37.5-51.0 % Collected: 02/01/2016 11:23 AM MCV 93 fL 79-97 fL Collected: 02/01/2016 11:23 AM MCH 32.7 pg 26.6-33.0 pg Collected: 02/01/2016 11:23 AM MCHC 35.1 g/dL 31.5-35.7 g/dL Collected: 02/01/2016 11:23 AM RDW 13.0 % 12.3-15.4 % Collected: 02/01/2016 11:23 AM Platelets 266 x10E3/uL 150-379 x10E3/uL Collected: 02/01/2016 11:23 AM Neutrophils 55 % Not Estab. % Collected: 02/01/2016 11:23 AM Lymphs 29 % Not Estab. % Collected: 02/01/2016 11:23 AM Monocytes 8 % Not Estab. % Collected: 02/01/2016 11:23 AM Eos 7 % Not Estab. % Collected: 02/01/2016 11:23 AM Basos 1 % Not Estab. % Collected: 02/01/2016 11:23 AM Immature Cells   Collected: 02/01/2016 11:23 AM Neutrophils (Absolute) 4.3  x10E3/uL 1.4-7.0 x10E3/uL Collected: 02/01/2016 11:23 AM Lymphs (Absolute) 2.2 x10E3/uL 0.7-3.1 x10E3/uL Collected: 02/01/2016 11:23 AM Monocytes(Absolute) 0.6 x10E3/uL 0.1-0.9 x10E3/uL Collected: 02/01/2016 11:23 AM Eos (Absolute) 0.5 x10E3/uL 0.0-0.4 x10E3/uL Collected: 02/01/2016 11:23 AM Baso (Absolute) 0.1 x10E3/uL 0.0-0.2 x10E3/uL Collected: 02/01/2016 11:23 AM Hematology Comments:   Collected: 02/01/2016 11:23 AM PT (PROTHROMBIN TIME) ([id number])   Collected: 02/01/2016 11:23 AM INR 0.9 0.8-1.2 Collected: 02/01/2016 11:23 AM Prothrombin Time 9.9 sec 9.1-12.0 sec Collected: 02/01/2016 11:23 AM  Procedures Name Value Date Myocardial perfusion imaging, tomographic (SPECT) (including attenuation correction, qualitative or quantitative wall motion, ejection fraction by first pass or gated technique, additional quantification, when performed); multiple studies, Comments: Exercise myoview stress 01/25/2016: 1. The resting electrocardiogram demonstrated normal  sinus rhythm, normal resting conduction, no resting arrhythmias and normal rest repolarization. The stress electrocardiogram revealed 3.5 mm of horizontal ST depression in lead (s):II, III, aVF, V4, V5, V6 consistent with myocardial ischemia. Patient exercised on Bruce protocol for 7:51 minutes and achieved 9.9 METS. Stress test terminated due to dyspnea and 86% MPHR achieved (Target HR >85%). 2. The perfusion imaging study demonstrates very subtle inferolateral ischemia, small duct most moderate-sized. In addition there is very subtle small sized anteroseptal ischemia extending in the distal left ventricle. Left ventricular systolic function calculated by QGS was mildly depressed at 44%. This is a high risk study, consider further cardiac work-up.  Performed: 01/27/2016 5:20 PM Echocardiography, transthoracic, real-time with image documentation (2D), includes M-mode recording,  when performed, complete, with spectral Doppler echocardiography, and with color flow Doppler echocardiography ([id number]) Comments: Echocardiogram 01/31/2016: Left ventricle cavity is normal in size. Borderline decrease in global wall motion. Visual EF is 45-50%. Left atrial cavity is mildly dilated at 4.3 cm. Mild to moderate mitral regurgitation. Mild tricuspid regurgitation. No evidence of pulmonary hypertension.  Performed: 02/01/2016 10:33 AM    Assessment & Plan Laverda Page MD; 02/02/2016 11:35 AM) Angina pectoris (I20.9) Story: Exercise myoview stress 01/25/2016: 1. The resting electrocardiogram demonstrated normal sinus rhythm, normal resting conduction, no resting arrhythmias and normal rest repolarization. The stress electrocardiogram revealed 3.5 mm of horizontal ST depression in lead (s):II, III, aVF, V4, V5, V6 consistent with myocardial ischemia. Patient exercised on Bruce protocol for 7:51 minutes and achieved 9.9 METS. Stress test terminated due to dyspnea and 86% MPHR achieved (Target HR >85%). 2. The perfusion imaging study demonstrates very subtle inferolateral ischemia, small duct most moderate-sized. In addition there is very subtle small sized anteroseptal ischemia extending in the distal left ventricle. Left ventricular systolic function calculated by QGS was mildly depressed at 44%. This is a high risk study, consider further cardiac work-up. Impression: EKG 01/15/2016: Normal sinus rhythm at rate of 66 bpm, normal axis. No evidence of ischemia, normal EKG.  Essential hypertension (I10) Story: Echocardiogram 01/31/2016: Left ventricle cavity is normal in size. Borderline decrease in global wall motion. Visual EF is 45-50%. Left atrial cavity is mildly dilated at 4.3 cm. Mild to moderate mitral regurgitation. Mild tricuspid regurgitation. No evidence of pulmonary hypertension. Hyperlipidemia, group A (E78.00) Labwork Story: Labs 02/01/2016: Serum glucose 105 mg,  nonfasting, BUN 17, serum creatinine 1.3, eGFR 60 mL, CBC normal, pro time 9.9, normal.  05/23/2015: Glucose 109, creatinine 1.04, potassium 4.3, CMP otherwise normal, TSH 0.75, free T4 normal, total cholesterol 132, triglycerides 105 and HDL 39, LDL 72 H/O: CVA (cerebrovascular accident) (Z86.73) CTA neck 07/03/2014: Gradual occlusion of the distal left vertebral artery in the upper neck to the C1 level. This more resembles sequelae of atherosclerotic disease and a focal dissection. High-grade stenosis of the left vertebral artery origin due to soft plaque. Mild right vertebral artery origin stenosis. Bilateral carotid bifurcation plaque without hemodynamically significant cervical carotid stenosis.  MRA head 07/02/2014: Left lateral mid-to suspicious for possible small acute ischemic infarct. Remote lacunar infarcts involving the bilateral thalami. Age-related atrophy.  Current Plans Mechanism of underlying disease process and action of medications discussed with the patient. I discussed primary/secondary prevention and also dietary counceling was done. Patient with high risk nuclear stress test with chest pain that is still present, I recommended that he proceed with coronary angiography. He is on appropriate medical therapy. You of the nuclear stress test, there is also suspicion of mild poststress LV dilatation  which may indicate multivessel disease.  Schedule for cardiac catheterization, and possible angioplasty. We discussed regarding risks, benefits, alternatives to this including stress testing, CTA and continued medical therapy. Patient wants to proceed. Understands <1-2% risk of death, stroke, MI, urgent CABG, bleeding, infection, renal failure but not limited to these. Video recording of the procedure shown to the patient.  CC: Aura Dials, MD  Signed by Laverda Page, MD (02/02/2016 11:36 AM)

## 2016-02-26 NOTE — Progress Notes (Signed)
TR BAND REMOVAL  LOCATION:    right radial  DEFLATED PER PROTOCOL:    Yes.    TIME BAND OFF / DRESSING APPLIED:    1645   SITE UPON ARRIVAL:    Level 0  SITE AFTER BAND REMOVAL:    Level 0  CIRCULATION SENSATION AND MOVEMENT:    Within Normal Limits   Yes.    COMMENTS:   Tolerated deflations well. No bleeding no hematoma. Dressing applied C/D/I. Educated on post removal restriction.

## 2016-02-27 ENCOUNTER — Encounter (HOSPITAL_COMMUNITY): Payer: Self-pay | Admitting: Cardiology

## 2016-02-27 DIAGNOSIS — I209 Angina pectoris, unspecified: Secondary | ICD-10-CM | POA: Diagnosis not present

## 2016-02-27 DIAGNOSIS — Z955 Presence of coronary angioplasty implant and graft: Secondary | ICD-10-CM | POA: Diagnosis not present

## 2016-02-27 DIAGNOSIS — I25110 Atherosclerotic heart disease of native coronary artery with unstable angina pectoris: Secondary | ICD-10-CM | POA: Diagnosis not present

## 2016-02-27 DIAGNOSIS — Z823 Family history of stroke: Secondary | ICD-10-CM | POA: Diagnosis not present

## 2016-02-27 DIAGNOSIS — Z8249 Family history of ischemic heart disease and other diseases of the circulatory system: Secondary | ICD-10-CM | POA: Diagnosis not present

## 2016-02-27 DIAGNOSIS — Z8673 Personal history of transient ischemic attack (TIA), and cerebral infarction without residual deficits: Secondary | ICD-10-CM | POA: Diagnosis not present

## 2016-02-27 DIAGNOSIS — I97630 Postprocedural hematoma of a circulatory system organ or structure following a cardiac catheterization: Secondary | ICD-10-CM | POA: Diagnosis not present

## 2016-02-27 DIAGNOSIS — I10 Essential (primary) hypertension: Secondary | ICD-10-CM | POA: Diagnosis not present

## 2016-02-27 DIAGNOSIS — I6502 Occlusion and stenosis of left vertebral artery: Secondary | ICD-10-CM | POA: Diagnosis not present

## 2016-02-27 DIAGNOSIS — E785 Hyperlipidemia, unspecified: Secondary | ICD-10-CM | POA: Diagnosis not present

## 2016-02-27 DIAGNOSIS — I25119 Atherosclerotic heart disease of native coronary artery with unspecified angina pectoris: Secondary | ICD-10-CM | POA: Diagnosis not present

## 2016-02-27 DIAGNOSIS — I081 Rheumatic disorders of both mitral and tricuspid valves: Secondary | ICD-10-CM | POA: Diagnosis not present

## 2016-02-27 LAB — BASIC METABOLIC PANEL
Anion gap: 8 (ref 5–15)
BUN: 23 mg/dL — AB (ref 6–20)
CALCIUM: 8.6 mg/dL — AB (ref 8.9–10.3)
CO2: 24 mmol/L (ref 22–32)
CREATININE: 1.26 mg/dL — AB (ref 0.61–1.24)
Chloride: 106 mmol/L (ref 101–111)
GFR calc Af Amer: >60 mL/min (ref >60)
GFR, EST NON AFRICAN AMERICAN: 57 mL/min — AB (ref >60)
GLUCOSE: 107 mg/dL — AB (ref 65–99)
Potassium: 4.1 mmol/L (ref 3.5–5.1)
Sodium: 138 mmol/L (ref 135–145)

## 2016-02-27 LAB — CBC
HCT: 34.8 % — ABNORMAL LOW (ref 39.0–52.0)
HEMOGLOBIN: 11.8 g/dL — AB (ref 13.0–17.0)
MCH: 32.2 pg (ref 26.0–34.0)
MCHC: 33.9 g/dL (ref 30.0–36.0)
MCV: 94.8 fL (ref 78.0–100.0)
Platelets: 194 10*3/uL (ref 150–400)
RBC: 3.67 MIL/uL — ABNORMAL LOW (ref 4.22–5.81)
RDW: 13.2 % (ref 11.5–15.5)
WBC: 8.2 10*3/uL (ref 4.0–10.5)

## 2016-02-27 NOTE — Care Management Note (Signed)
Case Management Note  Patient Details  Name: Daniel Quinn MRN: [id number] Date of Birth: 1945-08-25  Subjective/Objective:   S/p stent intervention, will be on plavix, pta indep, no needs.                 Action/Plan:   Expected Discharge Date:                  Expected Discharge Plan:  Home/Self Care  In-House Referral:     Discharge planning Services  CM Consult  Post Acute Care Choice:    Choice offered to:     DME Arranged:    DME Agency:     HH Arranged:    HH Agency:     Status of Service:  Completed, signed off  If discussed at [employer] of Stay Meetings, dates discussed:    Additional Comments:  [name], RN 02/27/2016, 11:25 AM

## 2016-02-27 NOTE — Progress Notes (Signed)
CARDIAC REHAB PHASE I   PRE:  Rate/Rhythm: 100 SR  BP:  Sitting: 132/73        SaO2: 99 RA  MODE:  Ambulation: 800 ft   POST:  Rate/Rhythm: 72 SR  BP:  Sitting: 125/61         SaO2: 98 RA  Pt was last seen by cardiac rehab 02/06/16. Pt ambulated 800 ft on RA, independent, steady gait, tolerated well with no complaints. Completed PCI/stent education.  Reviewed anti-platelet therapy, stent card, activity restrictions, ntg, exercise, heart healthy diet, and phase 2 cardiac rehab. Pt verbalized understanding, able to perform teach back. Pt agrees to phase 2 cardiac rehab referral, will send updated referral to [facility] per pt request. Pt up ad lib in room, eager for discharge.   [id number] Lenna Sciara, RN, BSN 02/27/2016 9:16 AM

## 2016-02-27 NOTE — Discharge Summary (Signed)
Physician Discharge Summary  Patient ID: Daniel Quinn MRN: [id number] DOB/AGE: [birth year] 68 y.o.  Admit date: 02/26/2016 Discharge date: 02/27/2016  Primary Discharge Diagnosis: 1. CAD 2. Hyperlipidemia 3. Essential HTN  Significant Diagnostic Studies: Coronary Angiogram 02/26/2016: Successful atherectomy with CSI diamondback catheter followed by stenting with 3.0 x 18 mm onyx in the proximal LAD, stenosis reduced from 90% to 0%.  Successful stenting of the proximal circumflex and mid circumflex coronary artery with overlapping 3.0 x 12 mm onyx and 3.0 x 15 mm Xience Alpine DES, stenosis reduced from 90% in the midsegment to 0% and 80% stenosis in the proximal segment to 0% with maintenance of TIMI-3 to TIMI-3 flow.  Hospital Course:  Daniel Quinn is a 70 y.o. male presenting with chest pain suggestive of angina pectoris, outpatient stress test had revealed inferolateral ischemia and also anterolateral ischemia considered to be high risk. In spite of continued medical therapy, he still had angina pectoris. He has history of hypertension, hyperlipidemia and prior history of stroke.  He was admitted to the hospital for elective angioplasty, underwent successful angioplasty of right coronary artery on 02/06/2016 with 3.0 x 18 and 2.5 x 15 mm Xience Alpine DES, with plans on performing elective angioplasty to the LAD.   He underwent successful atherectomy with CSI diamondback catheter followed by stenting with 3.0 x 18 mm onyx in the proximal LAD ad successful stenting of the proximal circumflex and mid circumflex coronary artery with overlapping 3.0 x 12 mm onyx and 3.0 x 15 mm Xience Alpine DES.  Recommendations on discharge: Follow up outpatient as schedule. He'll be continued with dual antiplatelet therapy.  Discharge Exam: Blood pressure 138/60, pulse 63, temperature 97.7 F (36.5 C), temperature source Oral, resp. rate 14, height 5\' 7"  (1.702 m), weight 83 kg (182 lb 15.7 oz),  SpO2 97 %.    General appearance: alert, cooperative, appears stated age and no distress Resp: clear to auscultation bilaterally Cardio: regular rate and rhythm, S1, S2 normal, no murmur, click, rub or gallop GI: soft, non-tender; bowel sounds normal; no masses,  no organomegaly Pulses: 2+ and symmetric right radial access stable. Small forearm hematoma un changed and of no consequence. Neurologic: Grossly normal  Labs:   Lab Results  Component Value Date   WBC 8.2 02/27/2016   HGB 11.8 (L) 02/27/2016   HCT 34.8 (L) 02/27/2016   MCV 94.8 02/27/2016   PLT 194 02/27/2016    Recent Labs Lab 02/27/16 0222  NA 138  K 4.1  CL 106  CO2 24  BUN 23*  CREATININE 1.26*  CALCIUM 8.6*  GLUCOSE 107*    Lipid Panel     Component Value Date/Time   CHOL 210 (H) 07/02/2014 0658   TRIG 160 (H) 07/02/2014 0658   HDL 44 07/02/2014 0658   CHOLHDL 4.8 07/02/2014 0658   VLDL 32 07/02/2014 0658   LDLCALC 134 (H) 07/02/2014 0658    BNP (last 3 results) No results for input(s): BNP in the last 8760 hours.  HEMOGLOBIN A1C Lab Results  Component Value Date   HGBA1C 6.0 (H) 07/02/2014   MPG 126 07/02/2014    Cardiac Panel (last 3 results) No results for input(s): CKTOTAL, CKMB, TROPONINI, RELINDX in the last 8760 hours.  No results found for: CKTOTAL, CKMB, CKMBINDEX, TROPONINI   TSH No results for input(s): TSH in the last 8760 hours.  EKG 02/27/2016: sinus rhythm at a rate of 60 bpm, no evidence of ischemia.   Radiology: No results found.  FOLLOW  UP PLANS AND APPOINTMENTS Discharge Instructions    Amb Referral to Cardiac Rehabilitation    Complete by:  As directed    Diagnosis:  Coronary Stents       Medication List    TAKE these medications   acetaminophen 325 MG tablet Commonly known as:  TYLENOL Take 650 mg by mouth every 6 (six) hours as needed for mild pain or headache.   amLODipine 10 MG tablet Commonly known as:  NORVASC Take 1 tablet (10 mg total) by  mouth daily.   aspirin 81 MG EC tablet Take 1 tablet (81 mg total) by mouth daily. Stop Asprin in 3 months   atorvastatin 40 MG tablet Commonly known as:  LIPITOR Take 1 tablet (40 mg total) by mouth daily at 6 PM.   azelastine 0.1 % nasal spray Commonly known as:  ASTELIN Place 2 sprays into both nostrils 2 (two) times daily. Use in each nostril as directed   clopidogrel 75 MG tablet Commonly known as:  PLAVIX Take 1 tablet (75 mg total) by mouth daily.   FLUoxetine 20 MG capsule Commonly known as:  PROZAC Take 20 mg by mouth daily.   fluticasone 50 MCG/ACT nasal spray Commonly known as:  FLONASE Place 1 spray into both nostrils daily.   isosorbide mononitrate 60 MG 24 hr tablet Commonly known as:  IMDUR Take 60 mg by mouth daily.   levocetirizine 5 MG tablet Commonly known as:  XYZAL Take 5 mg by mouth daily.   levothyroxine 112 MCG tablet Commonly known as:  SYNTHROID, LEVOTHROID Take 112 mcg by mouth daily before breakfast.   lisinopril 20 MG tablet Commonly known as:  PRINIVIL,ZESTRIL Take 1 tablet (20 mg total) by mouth daily.   metoprolol succinate 25 MG 24 hr tablet Commonly known as:  TOPROL-XL Take 25 mg by mouth daily.   multivitamin with minerals Tabs tablet Take 1 tablet by mouth daily.   nitroGLYCERIN 0.4 MG SL tablet Commonly known as:  NITROSTAT Place 0.4 mg under the tongue every 5 (five) minutes as needed for chest pain.   pantoprazole 20 MG tablet Commonly known as:  PROTONIX Take 20 mg by mouth 2 (two) times daily.   PROVENTIL HFA 108 (90 Base) MCG/ACT inhaler Generic drug:  albuterol Inhale 1-2 puffs into the lungs every 4 (four) hours as needed for wheezing or shortness of breath.   vitamin C 1000 MG tablet Take 1,000 mg by mouth daily.      Follow-up Information    Adrian Prows, MD Follow up on 03/10/2016.   Specialty:  Cardiology Why:  at 1:30pm Contact information: [address] [place]  [address] [phone]            Rachel Bo, NP-C 02/27/2016, 10:36 PM  Pager: [phone] Office: [phone] If no answer: [phone]

## 2016-02-28 MED FILL — Clopidogrel Bisulfate Tab 300 MG (Base Equiv): ORAL | Qty: 1 | Status: AC

## 2016-03-03 DIAGNOSIS — J3081 Allergic rhinitis due to animal (cat) (dog) hair and dander: Secondary | ICD-10-CM | POA: Diagnosis not present

## 2016-03-03 DIAGNOSIS — J3089 Other allergic rhinitis: Secondary | ICD-10-CM | POA: Diagnosis not present

## 2016-03-03 DIAGNOSIS — J301 Allergic rhinitis due to pollen: Secondary | ICD-10-CM | POA: Diagnosis not present

## 2016-03-10 DIAGNOSIS — I2510 Atherosclerotic heart disease of native coronary artery without angina pectoris: Secondary | ICD-10-CM | POA: Diagnosis not present

## 2016-03-10 DIAGNOSIS — I10 Essential (primary) hypertension: Secondary | ICD-10-CM | POA: Diagnosis not present

## 2016-03-10 DIAGNOSIS — I209 Angina pectoris, unspecified: Secondary | ICD-10-CM | POA: Diagnosis not present

## 2016-03-10 DIAGNOSIS — E7800 Pure hypercholesterolemia, unspecified: Secondary | ICD-10-CM | POA: Diagnosis not present

## 2016-03-11 DIAGNOSIS — M9903 Segmental and somatic dysfunction of lumbar region: Secondary | ICD-10-CM | POA: Diagnosis not present

## 2016-03-11 DIAGNOSIS — M545 Low back pain: Secondary | ICD-10-CM | POA: Diagnosis not present

## 2016-03-11 DIAGNOSIS — M62830 Muscle spasm of back: Secondary | ICD-10-CM | POA: Diagnosis not present

## 2016-03-11 DIAGNOSIS — M5136 Other intervertebral disc degeneration, lumbar region: Secondary | ICD-10-CM | POA: Diagnosis not present

## 2016-03-11 DIAGNOSIS — J3081 Allergic rhinitis due to animal (cat) (dog) hair and dander: Secondary | ICD-10-CM | POA: Diagnosis not present

## 2016-03-11 DIAGNOSIS — J3089 Other allergic rhinitis: Secondary | ICD-10-CM | POA: Diagnosis not present

## 2016-03-11 DIAGNOSIS — J301 Allergic rhinitis due to pollen: Secondary | ICD-10-CM | POA: Diagnosis not present

## 2016-03-19 DIAGNOSIS — J3081 Allergic rhinitis due to animal (cat) (dog) hair and dander: Secondary | ICD-10-CM | POA: Diagnosis not present

## 2016-03-19 DIAGNOSIS — J301 Allergic rhinitis due to pollen: Secondary | ICD-10-CM | POA: Diagnosis not present

## 2016-03-19 DIAGNOSIS — J3089 Other allergic rhinitis: Secondary | ICD-10-CM | POA: Diagnosis not present

## 2016-03-25 DIAGNOSIS — J0100 Acute maxillary sinusitis, unspecified: Secondary | ICD-10-CM | POA: Diagnosis not present

## 2016-03-26 DIAGNOSIS — J3081 Allergic rhinitis due to animal (cat) (dog) hair and dander: Secondary | ICD-10-CM | POA: Diagnosis not present

## 2016-03-26 DIAGNOSIS — J301 Allergic rhinitis due to pollen: Secondary | ICD-10-CM | POA: Diagnosis not present

## 2016-03-26 DIAGNOSIS — J3089 Other allergic rhinitis: Secondary | ICD-10-CM | POA: Diagnosis not present

## 2016-03-31 DIAGNOSIS — J3089 Other allergic rhinitis: Secondary | ICD-10-CM | POA: Diagnosis not present

## 2016-03-31 DIAGNOSIS — J3081 Allergic rhinitis due to animal (cat) (dog) hair and dander: Secondary | ICD-10-CM | POA: Diagnosis not present

## 2016-03-31 DIAGNOSIS — J301 Allergic rhinitis due to pollen: Secondary | ICD-10-CM | POA: Diagnosis not present

## 2016-04-01 DIAGNOSIS — M9903 Segmental and somatic dysfunction of lumbar region: Secondary | ICD-10-CM | POA: Diagnosis not present

## 2016-04-01 DIAGNOSIS — M62830 Muscle spasm of back: Secondary | ICD-10-CM | POA: Diagnosis not present

## 2016-04-01 DIAGNOSIS — M5136 Other intervertebral disc degeneration, lumbar region: Secondary | ICD-10-CM | POA: Diagnosis not present

## 2016-04-01 DIAGNOSIS — M545 Low back pain: Secondary | ICD-10-CM | POA: Diagnosis not present

## 2016-04-07 DIAGNOSIS — J301 Allergic rhinitis due to pollen: Secondary | ICD-10-CM | POA: Diagnosis not present

## 2016-04-07 DIAGNOSIS — J3081 Allergic rhinitis due to animal (cat) (dog) hair and dander: Secondary | ICD-10-CM | POA: Diagnosis not present

## 2016-04-07 DIAGNOSIS — J3089 Other allergic rhinitis: Secondary | ICD-10-CM | POA: Diagnosis not present

## 2016-04-14 DIAGNOSIS — J3089 Other allergic rhinitis: Secondary | ICD-10-CM | POA: Diagnosis not present

## 2016-04-14 DIAGNOSIS — J301 Allergic rhinitis due to pollen: Secondary | ICD-10-CM | POA: Diagnosis not present

## 2016-04-14 DIAGNOSIS — J3081 Allergic rhinitis due to animal (cat) (dog) hair and dander: Secondary | ICD-10-CM | POA: Diagnosis not present

## 2016-04-17 DIAGNOSIS — J3081 Allergic rhinitis due to animal (cat) (dog) hair and dander: Secondary | ICD-10-CM | POA: Diagnosis not present

## 2016-04-17 DIAGNOSIS — J3089 Other allergic rhinitis: Secondary | ICD-10-CM | POA: Diagnosis not present

## 2016-04-17 DIAGNOSIS — J301 Allergic rhinitis due to pollen: Secondary | ICD-10-CM | POA: Diagnosis not present

## 2016-04-21 DIAGNOSIS — J3089 Other allergic rhinitis: Secondary | ICD-10-CM | POA: Diagnosis not present

## 2016-04-21 DIAGNOSIS — J301 Allergic rhinitis due to pollen: Secondary | ICD-10-CM | POA: Diagnosis not present

## 2016-04-21 DIAGNOSIS — J3081 Allergic rhinitis due to animal (cat) (dog) hair and dander: Secondary | ICD-10-CM | POA: Diagnosis not present

## 2016-04-28 DIAGNOSIS — J3081 Allergic rhinitis due to animal (cat) (dog) hair and dander: Secondary | ICD-10-CM | POA: Diagnosis not present

## 2016-04-28 DIAGNOSIS — J3089 Other allergic rhinitis: Secondary | ICD-10-CM | POA: Diagnosis not present

## 2016-04-28 DIAGNOSIS — J301 Allergic rhinitis due to pollen: Secondary | ICD-10-CM | POA: Diagnosis not present

## 2016-04-29 DIAGNOSIS — M545 Low back pain: Secondary | ICD-10-CM | POA: Diagnosis not present

## 2016-04-29 DIAGNOSIS — M62830 Muscle spasm of back: Secondary | ICD-10-CM | POA: Diagnosis not present

## 2016-04-29 DIAGNOSIS — M5136 Other intervertebral disc degeneration, lumbar region: Secondary | ICD-10-CM | POA: Diagnosis not present

## 2016-04-29 DIAGNOSIS — M9903 Segmental and somatic dysfunction of lumbar region: Secondary | ICD-10-CM | POA: Diagnosis not present

## 2016-05-05 DIAGNOSIS — J3081 Allergic rhinitis due to animal (cat) (dog) hair and dander: Secondary | ICD-10-CM | POA: Diagnosis not present

## 2016-05-05 DIAGNOSIS — J301 Allergic rhinitis due to pollen: Secondary | ICD-10-CM | POA: Diagnosis not present

## 2016-05-05 DIAGNOSIS — J3089 Other allergic rhinitis: Secondary | ICD-10-CM | POA: Diagnosis not present

## 2016-05-13 DIAGNOSIS — J3081 Allergic rhinitis due to animal (cat) (dog) hair and dander: Secondary | ICD-10-CM | POA: Diagnosis not present

## 2016-05-13 DIAGNOSIS — J3089 Other allergic rhinitis: Secondary | ICD-10-CM | POA: Diagnosis not present

## 2016-05-13 DIAGNOSIS — J301 Allergic rhinitis due to pollen: Secondary | ICD-10-CM | POA: Diagnosis not present

## 2016-05-16 ENCOUNTER — Encounter (HOSPITAL_COMMUNITY): Payer: Self-pay | Admitting: Family Medicine

## 2016-05-16 NOTE — Progress Notes (Signed)
Mailed patient letter with information about Cardiac Rehab program. MW °

## 2016-05-19 DIAGNOSIS — J3081 Allergic rhinitis due to animal (cat) (dog) hair and dander: Secondary | ICD-10-CM | POA: Diagnosis not present

## 2016-05-19 DIAGNOSIS — J3089 Other allergic rhinitis: Secondary | ICD-10-CM | POA: Diagnosis not present

## 2016-05-19 DIAGNOSIS — J301 Allergic rhinitis due to pollen: Secondary | ICD-10-CM | POA: Diagnosis not present

## 2016-05-26 DIAGNOSIS — J301 Allergic rhinitis due to pollen: Secondary | ICD-10-CM | POA: Diagnosis not present

## 2016-05-26 DIAGNOSIS — J3089 Other allergic rhinitis: Secondary | ICD-10-CM | POA: Diagnosis not present

## 2016-05-26 DIAGNOSIS — J3081 Allergic rhinitis due to animal (cat) (dog) hair and dander: Secondary | ICD-10-CM | POA: Diagnosis not present

## 2016-05-27 DIAGNOSIS — M9903 Segmental and somatic dysfunction of lumbar region: Secondary | ICD-10-CM | POA: Diagnosis not present

## 2016-05-27 DIAGNOSIS — M545 Low back pain: Secondary | ICD-10-CM | POA: Diagnosis not present

## 2016-05-27 DIAGNOSIS — M5136 Other intervertebral disc degeneration, lumbar region: Secondary | ICD-10-CM | POA: Diagnosis not present

## 2016-05-27 DIAGNOSIS — M62830 Muscle spasm of back: Secondary | ICD-10-CM | POA: Diagnosis not present

## 2016-06-03 DIAGNOSIS — J3089 Other allergic rhinitis: Secondary | ICD-10-CM | POA: Diagnosis not present

## 2016-06-03 DIAGNOSIS — J3081 Allergic rhinitis due to animal (cat) (dog) hair and dander: Secondary | ICD-10-CM | POA: Diagnosis not present

## 2016-06-03 DIAGNOSIS — J301 Allergic rhinitis due to pollen: Secondary | ICD-10-CM | POA: Diagnosis not present

## 2016-06-09 DIAGNOSIS — C44519 Basal cell carcinoma of skin of other part of trunk: Secondary | ICD-10-CM | POA: Diagnosis not present

## 2016-06-09 DIAGNOSIS — J3081 Allergic rhinitis due to animal (cat) (dog) hair and dander: Secondary | ICD-10-CM | POA: Diagnosis not present

## 2016-06-09 DIAGNOSIS — J3089 Other allergic rhinitis: Secondary | ICD-10-CM | POA: Diagnosis not present

## 2016-06-09 DIAGNOSIS — L570 Actinic keratosis: Secondary | ICD-10-CM | POA: Diagnosis not present

## 2016-06-09 DIAGNOSIS — J301 Allergic rhinitis due to pollen: Secondary | ICD-10-CM | POA: Diagnosis not present

## 2016-06-12 DIAGNOSIS — J301 Allergic rhinitis due to pollen: Secondary | ICD-10-CM | POA: Diagnosis not present

## 2016-06-12 DIAGNOSIS — J3089 Other allergic rhinitis: Secondary | ICD-10-CM | POA: Diagnosis not present

## 2016-06-12 DIAGNOSIS — J3081 Allergic rhinitis due to animal (cat) (dog) hair and dander: Secondary | ICD-10-CM | POA: Diagnosis not present

## 2016-06-13 DIAGNOSIS — L570 Actinic keratosis: Secondary | ICD-10-CM | POA: Diagnosis not present

## 2016-06-16 DIAGNOSIS — J3081 Allergic rhinitis due to animal (cat) (dog) hair and dander: Secondary | ICD-10-CM | POA: Diagnosis not present

## 2016-06-16 DIAGNOSIS — J301 Allergic rhinitis due to pollen: Secondary | ICD-10-CM | POA: Diagnosis not present

## 2016-06-16 DIAGNOSIS — J3089 Other allergic rhinitis: Secondary | ICD-10-CM | POA: Diagnosis not present

## 2016-06-23 DIAGNOSIS — J3081 Allergic rhinitis due to animal (cat) (dog) hair and dander: Secondary | ICD-10-CM | POA: Diagnosis not present

## 2016-06-23 DIAGNOSIS — J301 Allergic rhinitis due to pollen: Secondary | ICD-10-CM | POA: Diagnosis not present

## 2016-06-23 DIAGNOSIS — J3089 Other allergic rhinitis: Secondary | ICD-10-CM | POA: Diagnosis not present

## 2016-07-01 DIAGNOSIS — J3089 Other allergic rhinitis: Secondary | ICD-10-CM | POA: Diagnosis not present

## 2016-07-01 DIAGNOSIS — M62830 Muscle spasm of back: Secondary | ICD-10-CM | POA: Diagnosis not present

## 2016-07-01 DIAGNOSIS — M545 Low back pain: Secondary | ICD-10-CM | POA: Diagnosis not present

## 2016-07-01 DIAGNOSIS — M5136 Other intervertebral disc degeneration, lumbar region: Secondary | ICD-10-CM | POA: Diagnosis not present

## 2016-07-01 DIAGNOSIS — M9903 Segmental and somatic dysfunction of lumbar region: Secondary | ICD-10-CM | POA: Diagnosis not present

## 2016-07-01 DIAGNOSIS — J301 Allergic rhinitis due to pollen: Secondary | ICD-10-CM | POA: Diagnosis not present

## 2016-07-01 DIAGNOSIS — J3081 Allergic rhinitis due to animal (cat) (dog) hair and dander: Secondary | ICD-10-CM | POA: Diagnosis not present

## 2016-07-07 DIAGNOSIS — J3081 Allergic rhinitis due to animal (cat) (dog) hair and dander: Secondary | ICD-10-CM | POA: Diagnosis not present

## 2016-07-07 DIAGNOSIS — J301 Allergic rhinitis due to pollen: Secondary | ICD-10-CM | POA: Diagnosis not present

## 2016-07-07 DIAGNOSIS — J3089 Other allergic rhinitis: Secondary | ICD-10-CM | POA: Diagnosis not present

## 2016-07-09 DIAGNOSIS — R600 Localized edema: Secondary | ICD-10-CM | POA: Diagnosis not present

## 2016-07-10 DIAGNOSIS — C44519 Basal cell carcinoma of skin of other part of trunk: Secondary | ICD-10-CM | POA: Diagnosis not present

## 2016-07-10 DIAGNOSIS — D0439 Carcinoma in situ of skin of other parts of face: Secondary | ICD-10-CM | POA: Diagnosis not present

## 2016-07-14 DIAGNOSIS — J3089 Other allergic rhinitis: Secondary | ICD-10-CM | POA: Diagnosis not present

## 2016-07-14 DIAGNOSIS — J301 Allergic rhinitis due to pollen: Secondary | ICD-10-CM | POA: Diagnosis not present

## 2016-07-14 DIAGNOSIS — J3081 Allergic rhinitis due to animal (cat) (dog) hair and dander: Secondary | ICD-10-CM | POA: Diagnosis not present

## 2016-07-21 DIAGNOSIS — J3081 Allergic rhinitis due to animal (cat) (dog) hair and dander: Secondary | ICD-10-CM | POA: Diagnosis not present

## 2016-07-21 DIAGNOSIS — J301 Allergic rhinitis due to pollen: Secondary | ICD-10-CM | POA: Diagnosis not present

## 2016-07-21 DIAGNOSIS — J3089 Other allergic rhinitis: Secondary | ICD-10-CM | POA: Diagnosis not present

## 2016-07-29 DIAGNOSIS — J3089 Other allergic rhinitis: Secondary | ICD-10-CM | POA: Diagnosis not present

## 2016-07-29 DIAGNOSIS — J301 Allergic rhinitis due to pollen: Secondary | ICD-10-CM | POA: Diagnosis not present

## 2016-07-29 DIAGNOSIS — M62830 Muscle spasm of back: Secondary | ICD-10-CM | POA: Diagnosis not present

## 2016-07-29 DIAGNOSIS — M545 Low back pain: Secondary | ICD-10-CM | POA: Diagnosis not present

## 2016-07-29 DIAGNOSIS — M9903 Segmental and somatic dysfunction of lumbar region: Secondary | ICD-10-CM | POA: Diagnosis not present

## 2016-07-29 DIAGNOSIS — M5136 Other intervertebral disc degeneration, lumbar region: Secondary | ICD-10-CM | POA: Diagnosis not present

## 2016-07-29 DIAGNOSIS — J3081 Allergic rhinitis due to animal (cat) (dog) hair and dander: Secondary | ICD-10-CM | POA: Diagnosis not present

## 2016-08-04 DIAGNOSIS — J3089 Other allergic rhinitis: Secondary | ICD-10-CM | POA: Diagnosis not present

## 2016-08-04 DIAGNOSIS — J3081 Allergic rhinitis due to animal (cat) (dog) hair and dander: Secondary | ICD-10-CM | POA: Diagnosis not present

## 2016-08-04 DIAGNOSIS — J301 Allergic rhinitis due to pollen: Secondary | ICD-10-CM | POA: Diagnosis not present

## 2016-08-06 DIAGNOSIS — I10 Essential (primary) hypertension: Secondary | ICD-10-CM | POA: Diagnosis not present

## 2016-08-07 DIAGNOSIS — L570 Actinic keratosis: Secondary | ICD-10-CM | POA: Diagnosis not present

## 2016-08-07 DIAGNOSIS — D0439 Carcinoma in situ of skin of other parts of face: Secondary | ICD-10-CM | POA: Diagnosis not present

## 2016-08-11 DIAGNOSIS — J3081 Allergic rhinitis due to animal (cat) (dog) hair and dander: Secondary | ICD-10-CM | POA: Diagnosis not present

## 2016-08-11 DIAGNOSIS — J3089 Other allergic rhinitis: Secondary | ICD-10-CM | POA: Diagnosis not present

## 2016-08-11 DIAGNOSIS — J301 Allergic rhinitis due to pollen: Secondary | ICD-10-CM | POA: Diagnosis not present

## 2016-08-20 DIAGNOSIS — J3089 Other allergic rhinitis: Secondary | ICD-10-CM | POA: Diagnosis not present

## 2016-08-20 DIAGNOSIS — J3081 Allergic rhinitis due to animal (cat) (dog) hair and dander: Secondary | ICD-10-CM | POA: Diagnosis not present

## 2016-08-20 DIAGNOSIS — J301 Allergic rhinitis due to pollen: Secondary | ICD-10-CM | POA: Diagnosis not present

## 2016-08-23 IMAGING — CT CT ANGIO NECK
2 of 8 series · 8 of 33 positions shown · IV contrast (OMNI 350)
Comparison: Brain MRI/ MRA 07/02/2014.

ADDENDUM:
Vertebral artery findings discussed by telephone with Dr. GRADSKI RICCI
on 07/03/2014 at 0812 hrs.
CLINICAL DATA: 68-year-old male with right side weakness and this
aphasia. Left medullary brainstem infarct. Abnormal left vertebral
artery on brain MRI and MRA.

EXAM:
CT ANGIOGRAPHY NECK
TECHNIQUE: Multidetector CT imaging of the neck was performed using the
standard protocol during bolus administration of intravenous
contrast. Multiplanar CT image reconstructions and MIPs were
obtained to evaluate the vascular anatomy. Carotid stenosis
measurements (when applicable) are obtained utilizing NASCET
criteria, using the distal internal carotid diameter as the
denominator.
CONTRAST:  80mL OMNIPAQUE IOHEXOL 350 MG/ML SOLN

[Series 6: headangio 2.0 j40f 1 · axial · 0.41mm/px · z∈[+1180,+1254]mm · 2 of 112 slices shown]
[im 38/112  soft-tissue]
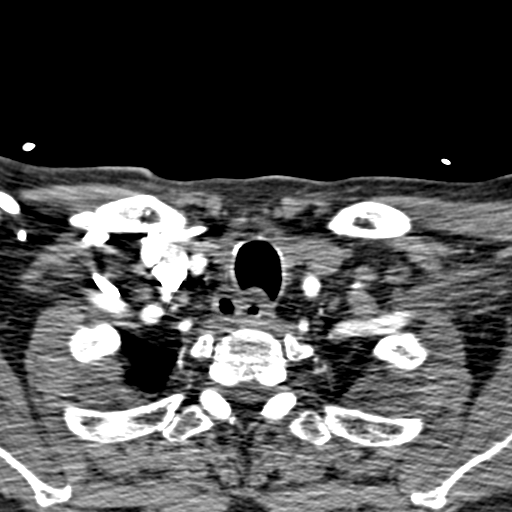
[im 75/112  soft-tissue]
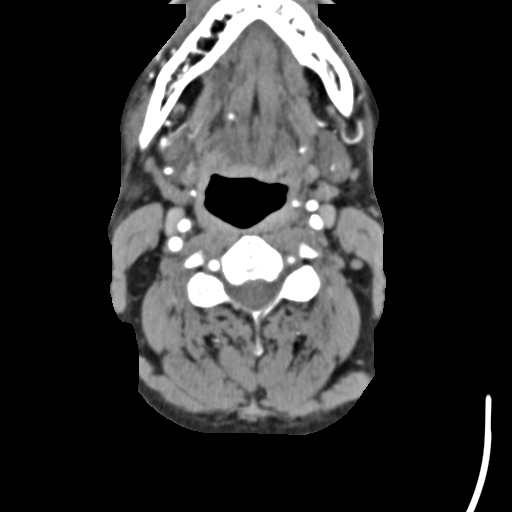

[Series 7: axial 1x1 · axial · 0.41mm/px · z∈[+1139,+1299]mm · 6 of 225 slices shown]
[im 33/225  soft-tissue]
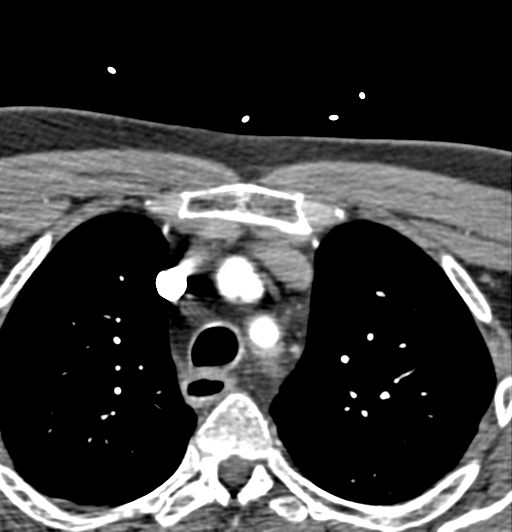
[im 65/225  bone]
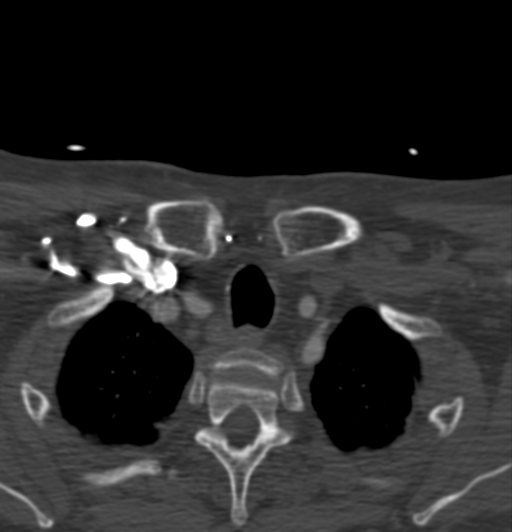
[im 97/225  soft-tissue]
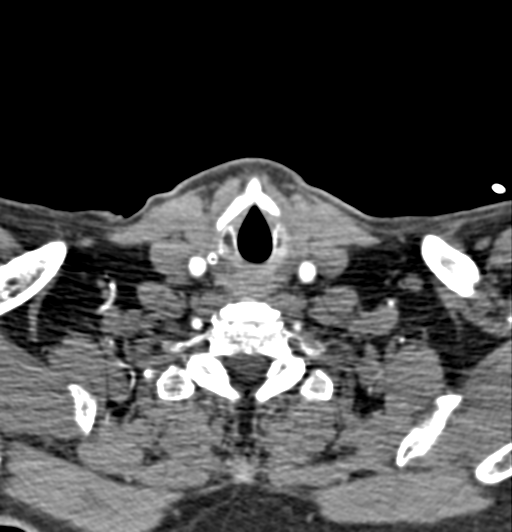
[im 129/225  bone]
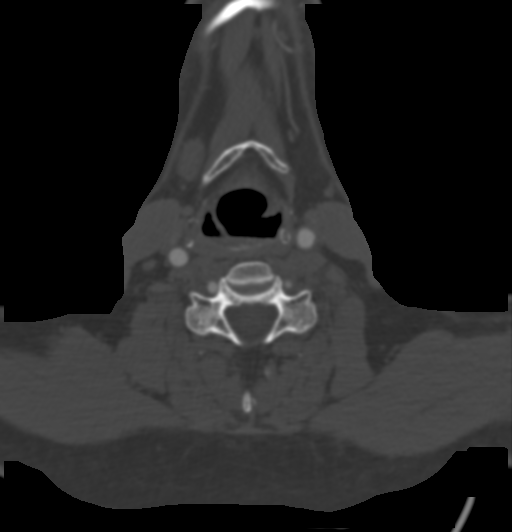
[im 161/225  soft-tissue]
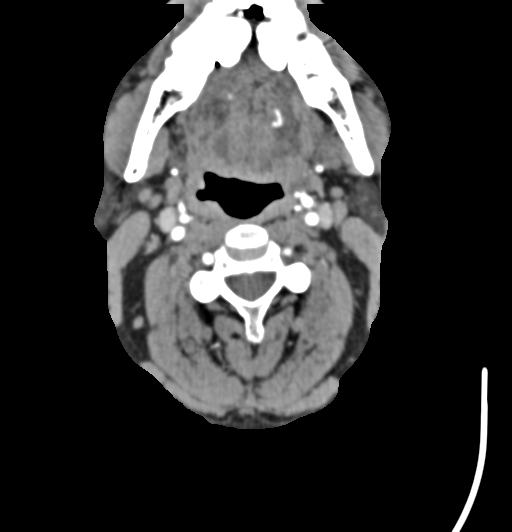
[im 193/225  bone]
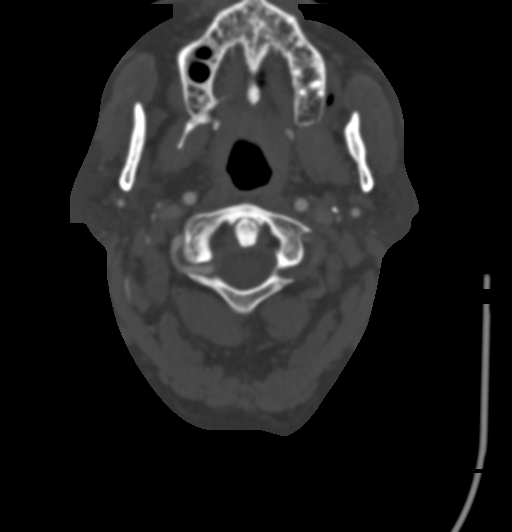

[8 of 33 positions shown; findings below may reference images not displayed]

FINDINGS: Skeleton:  No acute osseous abnormality identified.

Other neck: Minor left maxillary sinus mucosal thickening.

Negative lung apices.  No superior mediastinal lymphadenopathy.

Evidence of left vocal cord paralysis. Negative AP window and no
lower neck or upper chest mass or causative lesion identified.

Pharyngeal contours are within normal limits. Negative
parapharyngeal spaces, retropharyngeal space, sublingual space,
submandibular glands and parotid glands. No cervical
lymphadenopathy.

Aortic arch: Bovine arch configuration with mild to moderate
atherosclerosis. No great vessel origin stenosis.

Right carotid system: Negative right CCA. Soft and calcified plaque
at the right carotid bifurcation resulting in less than 50 %
stenosis with respect to the distal vessel of the proximal right
ICA. Negative cervical right ICA otherwise.

Left carotid system: Negative left CCA. Soft and calcified plaque at
the left carotid bifurcation resulting in less than 50 % stenosis
with respect to the distal vessel of the proximal left ICA. Negative
cervical left ICA otherwise.

Vertebral arteries:

No proximal right subclavian hemodynamically significant stenosis
despite proximal soft and calcified plaque. Mild stenosis at the
origin of the right vertebral artery which now appears dominant.
Negative right vertebral artery otherwise to the right PICA and
vertebrobasilar junctions.

No proximal left subclavian artery stenosis despite mild plaque.
There is soft plaque at the left vertebral artery origin resulting
in severe stenosis best seen on coronal image 138 of series 8.
Nevertheless, the left vertebral artery is patent in the V1 and V2
segments. However, by the distal V2 segment and demonstrates
asymmetrically decreased enhancement and becomes more in more
diminutive. The vessel then somewhat gradually occludes at the C1
ring level (see series 7, images 195 and 196). The left V4 segment
is occluded except for probable retrograde supply to the distal
vessel. The left PICA is incompletely visible but enhancing.
IMPRESSION: 1. Gradual occlusion of the distal left vertebral artery in the
upper neck to the C1 level. This more resembles sequelae of
atherosclerotic disease than a focal dissection. There is also
high-grade stenosis at the left vertebral artery origin due to soft
plaque.
2. Mild right vertebral artery origin stenosis. Bilateral carotid
bifurcation plaque without hemodynamically significant cervical
carotid stenosis.
3. Left vocal cord paralysis, age and etiology indeterminate. No
neck mass or lymphadenopathy identified.

## 2016-08-26 DIAGNOSIS — J3089 Other allergic rhinitis: Secondary | ICD-10-CM | POA: Diagnosis not present

## 2016-08-26 DIAGNOSIS — M62830 Muscle spasm of back: Secondary | ICD-10-CM | POA: Diagnosis not present

## 2016-08-26 DIAGNOSIS — J301 Allergic rhinitis due to pollen: Secondary | ICD-10-CM | POA: Diagnosis not present

## 2016-08-26 DIAGNOSIS — J3081 Allergic rhinitis due to animal (cat) (dog) hair and dander: Secondary | ICD-10-CM | POA: Diagnosis not present

## 2016-08-26 DIAGNOSIS — M9903 Segmental and somatic dysfunction of lumbar region: Secondary | ICD-10-CM | POA: Diagnosis not present

## 2016-08-26 DIAGNOSIS — M5136 Other intervertebral disc degeneration, lumbar region: Secondary | ICD-10-CM | POA: Diagnosis not present

## 2016-08-26 DIAGNOSIS — M545 Low back pain: Secondary | ICD-10-CM | POA: Diagnosis not present

## 2016-08-27 DIAGNOSIS — I10 Essential (primary) hypertension: Secondary | ICD-10-CM | POA: Diagnosis not present

## 2016-09-01 DIAGNOSIS — J3081 Allergic rhinitis due to animal (cat) (dog) hair and dander: Secondary | ICD-10-CM | POA: Diagnosis not present

## 2016-09-01 DIAGNOSIS — J3089 Other allergic rhinitis: Secondary | ICD-10-CM | POA: Diagnosis not present

## 2016-09-01 DIAGNOSIS — J301 Allergic rhinitis due to pollen: Secondary | ICD-10-CM | POA: Diagnosis not present

## 2016-09-08 DIAGNOSIS — J301 Allergic rhinitis due to pollen: Secondary | ICD-10-CM | POA: Diagnosis not present

## 2016-09-08 DIAGNOSIS — J3089 Other allergic rhinitis: Secondary | ICD-10-CM | POA: Diagnosis not present

## 2016-09-08 DIAGNOSIS — J3081 Allergic rhinitis due to animal (cat) (dog) hair and dander: Secondary | ICD-10-CM | POA: Diagnosis not present

## 2016-09-10 DIAGNOSIS — I10 Essential (primary) hypertension: Secondary | ICD-10-CM | POA: Diagnosis not present

## 2016-09-10 DIAGNOSIS — E7800 Pure hypercholesterolemia, unspecified: Secondary | ICD-10-CM | POA: Diagnosis not present

## 2016-09-10 DIAGNOSIS — I2510 Atherosclerotic heart disease of native coronary artery without angina pectoris: Secondary | ICD-10-CM | POA: Diagnosis not present

## 2016-09-10 DIAGNOSIS — R0989 Other specified symptoms and signs involving the circulatory and respiratory systems: Secondary | ICD-10-CM | POA: Diagnosis not present

## 2016-09-15 DIAGNOSIS — J301 Allergic rhinitis due to pollen: Secondary | ICD-10-CM | POA: Diagnosis not present

## 2016-09-15 DIAGNOSIS — J3089 Other allergic rhinitis: Secondary | ICD-10-CM | POA: Diagnosis not present

## 2016-09-22 DIAGNOSIS — J301 Allergic rhinitis due to pollen: Secondary | ICD-10-CM | POA: Diagnosis not present

## 2016-09-22 DIAGNOSIS — J3089 Other allergic rhinitis: Secondary | ICD-10-CM | POA: Diagnosis not present

## 2016-09-22 DIAGNOSIS — J3081 Allergic rhinitis due to animal (cat) (dog) hair and dander: Secondary | ICD-10-CM | POA: Diagnosis not present

## 2016-09-23 DIAGNOSIS — M5136 Other intervertebral disc degeneration, lumbar region: Secondary | ICD-10-CM | POA: Diagnosis not present

## 2016-09-23 DIAGNOSIS — M62830 Muscle spasm of back: Secondary | ICD-10-CM | POA: Diagnosis not present

## 2016-09-23 DIAGNOSIS — M9903 Segmental and somatic dysfunction of lumbar region: Secondary | ICD-10-CM | POA: Diagnosis not present

## 2016-09-23 DIAGNOSIS — M545 Low back pain: Secondary | ICD-10-CM | POA: Diagnosis not present

## 2016-09-25 DIAGNOSIS — J3081 Allergic rhinitis due to animal (cat) (dog) hair and dander: Secondary | ICD-10-CM | POA: Diagnosis not present

## 2016-09-25 DIAGNOSIS — R0989 Other specified symptoms and signs involving the circulatory and respiratory systems: Secondary | ICD-10-CM | POA: Diagnosis not present

## 2016-09-30 DIAGNOSIS — J3081 Allergic rhinitis due to animal (cat) (dog) hair and dander: Secondary | ICD-10-CM | POA: Diagnosis not present

## 2016-09-30 DIAGNOSIS — J301 Allergic rhinitis due to pollen: Secondary | ICD-10-CM | POA: Diagnosis not present

## 2016-09-30 DIAGNOSIS — J3089 Other allergic rhinitis: Secondary | ICD-10-CM | POA: Diagnosis not present

## 2016-10-08 DIAGNOSIS — J3089 Other allergic rhinitis: Secondary | ICD-10-CM | POA: Diagnosis not present

## 2016-10-08 DIAGNOSIS — J301 Allergic rhinitis due to pollen: Secondary | ICD-10-CM | POA: Diagnosis not present

## 2016-10-08 DIAGNOSIS — J3081 Allergic rhinitis due to animal (cat) (dog) hair and dander: Secondary | ICD-10-CM | POA: Diagnosis not present

## 2016-10-13 DIAGNOSIS — J3089 Other allergic rhinitis: Secondary | ICD-10-CM | POA: Diagnosis not present

## 2016-10-13 DIAGNOSIS — J3081 Allergic rhinitis due to animal (cat) (dog) hair and dander: Secondary | ICD-10-CM | POA: Diagnosis not present

## 2016-10-13 DIAGNOSIS — J301 Allergic rhinitis due to pollen: Secondary | ICD-10-CM | POA: Diagnosis not present

## 2016-10-20 DIAGNOSIS — J3081 Allergic rhinitis due to animal (cat) (dog) hair and dander: Secondary | ICD-10-CM | POA: Diagnosis not present

## 2016-10-20 DIAGNOSIS — J301 Allergic rhinitis due to pollen: Secondary | ICD-10-CM | POA: Diagnosis not present

## 2016-10-20 DIAGNOSIS — J3089 Other allergic rhinitis: Secondary | ICD-10-CM | POA: Diagnosis not present

## 2016-10-23 DIAGNOSIS — J3089 Other allergic rhinitis: Secondary | ICD-10-CM | POA: Diagnosis not present

## 2016-10-23 DIAGNOSIS — J301 Allergic rhinitis due to pollen: Secondary | ICD-10-CM | POA: Diagnosis not present

## 2016-10-23 DIAGNOSIS — J3081 Allergic rhinitis due to animal (cat) (dog) hair and dander: Secondary | ICD-10-CM | POA: Diagnosis not present

## 2016-10-28 DIAGNOSIS — M545 Low back pain: Secondary | ICD-10-CM | POA: Diagnosis not present

## 2016-10-28 DIAGNOSIS — J301 Allergic rhinitis due to pollen: Secondary | ICD-10-CM | POA: Diagnosis not present

## 2016-10-28 DIAGNOSIS — M9903 Segmental and somatic dysfunction of lumbar region: Secondary | ICD-10-CM | POA: Diagnosis not present

## 2016-10-28 DIAGNOSIS — M5136 Other intervertebral disc degeneration, lumbar region: Secondary | ICD-10-CM | POA: Diagnosis not present

## 2016-10-28 DIAGNOSIS — J3089 Other allergic rhinitis: Secondary | ICD-10-CM | POA: Diagnosis not present

## 2016-10-28 DIAGNOSIS — J3081 Allergic rhinitis due to animal (cat) (dog) hair and dander: Secondary | ICD-10-CM | POA: Diagnosis not present

## 2016-10-28 DIAGNOSIS — M62830 Muscle spasm of back: Secondary | ICD-10-CM | POA: Diagnosis not present

## 2016-10-31 DIAGNOSIS — J301 Allergic rhinitis due to pollen: Secondary | ICD-10-CM | POA: Diagnosis not present

## 2016-10-31 DIAGNOSIS — J3089 Other allergic rhinitis: Secondary | ICD-10-CM | POA: Diagnosis not present

## 2016-10-31 DIAGNOSIS — I10 Essential (primary) hypertension: Secondary | ICD-10-CM | POA: Diagnosis not present

## 2016-10-31 DIAGNOSIS — J3081 Allergic rhinitis due to animal (cat) (dog) hair and dander: Secondary | ICD-10-CM | POA: Diagnosis not present

## 2016-11-07 DIAGNOSIS — J301 Allergic rhinitis due to pollen: Secondary | ICD-10-CM | POA: Diagnosis not present

## 2016-11-07 DIAGNOSIS — J3081 Allergic rhinitis due to animal (cat) (dog) hair and dander: Secondary | ICD-10-CM | POA: Diagnosis not present

## 2016-11-07 DIAGNOSIS — J3089 Other allergic rhinitis: Secondary | ICD-10-CM | POA: Diagnosis not present

## 2016-11-10 DIAGNOSIS — Z029 Encounter for administrative examinations, unspecified: Secondary | ICD-10-CM | POA: Diagnosis not present

## 2016-11-11 DIAGNOSIS — J301 Allergic rhinitis due to pollen: Secondary | ICD-10-CM | POA: Diagnosis not present

## 2016-11-11 DIAGNOSIS — J3081 Allergic rhinitis due to animal (cat) (dog) hair and dander: Secondary | ICD-10-CM | POA: Diagnosis not present

## 2016-11-11 DIAGNOSIS — J3089 Other allergic rhinitis: Secondary | ICD-10-CM | POA: Diagnosis not present

## 2016-11-18 DIAGNOSIS — J0101 Acute recurrent maxillary sinusitis: Secondary | ICD-10-CM | POA: Diagnosis not present

## 2016-11-25 DIAGNOSIS — J3081 Allergic rhinitis due to animal (cat) (dog) hair and dander: Secondary | ICD-10-CM | POA: Diagnosis not present

## 2016-11-25 DIAGNOSIS — J3089 Other allergic rhinitis: Secondary | ICD-10-CM | POA: Diagnosis not present

## 2016-11-25 DIAGNOSIS — J301 Allergic rhinitis due to pollen: Secondary | ICD-10-CM | POA: Diagnosis not present

## 2016-11-26 DIAGNOSIS — M62830 Muscle spasm of back: Secondary | ICD-10-CM | POA: Diagnosis not present

## 2016-11-26 DIAGNOSIS — M5136 Other intervertebral disc degeneration, lumbar region: Secondary | ICD-10-CM | POA: Diagnosis not present

## 2016-11-26 DIAGNOSIS — M545 Low back pain: Secondary | ICD-10-CM | POA: Diagnosis not present

## 2016-11-26 DIAGNOSIS — M9903 Segmental and somatic dysfunction of lumbar region: Secondary | ICD-10-CM | POA: Diagnosis not present

## 2016-11-29 DIAGNOSIS — Z23 Encounter for immunization: Secondary | ICD-10-CM | POA: Diagnosis not present

## 2016-12-02 DIAGNOSIS — J301 Allergic rhinitis due to pollen: Secondary | ICD-10-CM | POA: Diagnosis not present

## 2016-12-02 DIAGNOSIS — J3081 Allergic rhinitis due to animal (cat) (dog) hair and dander: Secondary | ICD-10-CM | POA: Diagnosis not present

## 2016-12-02 DIAGNOSIS — J3089 Other allergic rhinitis: Secondary | ICD-10-CM | POA: Diagnosis not present

## 2016-12-09 DIAGNOSIS — J3089 Other allergic rhinitis: Secondary | ICD-10-CM | POA: Diagnosis not present

## 2016-12-09 DIAGNOSIS — H1045 Other chronic allergic conjunctivitis: Secondary | ICD-10-CM | POA: Diagnosis not present

## 2016-12-09 DIAGNOSIS — J3081 Allergic rhinitis due to animal (cat) (dog) hair and dander: Secondary | ICD-10-CM | POA: Diagnosis not present

## 2016-12-09 DIAGNOSIS — J301 Allergic rhinitis due to pollen: Secondary | ICD-10-CM | POA: Diagnosis not present

## 2016-12-15 DIAGNOSIS — J301 Allergic rhinitis due to pollen: Secondary | ICD-10-CM | POA: Diagnosis not present

## 2016-12-15 DIAGNOSIS — J3089 Other allergic rhinitis: Secondary | ICD-10-CM | POA: Diagnosis not present

## 2016-12-15 DIAGNOSIS — J3081 Allergic rhinitis due to animal (cat) (dog) hair and dander: Secondary | ICD-10-CM | POA: Diagnosis not present

## 2016-12-23 DIAGNOSIS — J3081 Allergic rhinitis due to animal (cat) (dog) hair and dander: Secondary | ICD-10-CM | POA: Diagnosis not present

## 2016-12-23 DIAGNOSIS — J3089 Other allergic rhinitis: Secondary | ICD-10-CM | POA: Diagnosis not present

## 2016-12-23 DIAGNOSIS — J301 Allergic rhinitis due to pollen: Secondary | ICD-10-CM | POA: Diagnosis not present

## 2016-12-29 DIAGNOSIS — J301 Allergic rhinitis due to pollen: Secondary | ICD-10-CM | POA: Diagnosis not present

## 2016-12-29 DIAGNOSIS — J3089 Other allergic rhinitis: Secondary | ICD-10-CM | POA: Diagnosis not present

## 2016-12-29 DIAGNOSIS — J3081 Allergic rhinitis due to animal (cat) (dog) hair and dander: Secondary | ICD-10-CM | POA: Diagnosis not present

## 2017-01-05 DIAGNOSIS — J3081 Allergic rhinitis due to animal (cat) (dog) hair and dander: Secondary | ICD-10-CM | POA: Diagnosis not present

## 2017-01-05 DIAGNOSIS — J3089 Other allergic rhinitis: Secondary | ICD-10-CM | POA: Diagnosis not present

## 2017-01-05 DIAGNOSIS — J301 Allergic rhinitis due to pollen: Secondary | ICD-10-CM | POA: Diagnosis not present

## 2017-01-12 DIAGNOSIS — J301 Allergic rhinitis due to pollen: Secondary | ICD-10-CM | POA: Diagnosis not present

## 2017-01-12 DIAGNOSIS — J3089 Other allergic rhinitis: Secondary | ICD-10-CM | POA: Diagnosis not present

## 2017-01-12 DIAGNOSIS — J3081 Allergic rhinitis due to animal (cat) (dog) hair and dander: Secondary | ICD-10-CM | POA: Diagnosis not present

## 2017-01-19 DIAGNOSIS — J3081 Allergic rhinitis due to animal (cat) (dog) hair and dander: Secondary | ICD-10-CM | POA: Diagnosis not present

## 2017-01-19 DIAGNOSIS — J3089 Other allergic rhinitis: Secondary | ICD-10-CM | POA: Diagnosis not present

## 2017-01-19 DIAGNOSIS — J301 Allergic rhinitis due to pollen: Secondary | ICD-10-CM | POA: Diagnosis not present

## 2017-01-26 DIAGNOSIS — I10 Essential (primary) hypertension: Secondary | ICD-10-CM | POA: Diagnosis not present

## 2017-01-30 DIAGNOSIS — J301 Allergic rhinitis due to pollen: Secondary | ICD-10-CM | POA: Diagnosis not present

## 2017-01-30 DIAGNOSIS — J3081 Allergic rhinitis due to animal (cat) (dog) hair and dander: Secondary | ICD-10-CM | POA: Diagnosis not present

## 2017-01-30 DIAGNOSIS — J3089 Other allergic rhinitis: Secondary | ICD-10-CM | POA: Diagnosis not present

## 2017-02-09 DIAGNOSIS — J3081 Allergic rhinitis due to animal (cat) (dog) hair and dander: Secondary | ICD-10-CM | POA: Diagnosis not present

## 2017-02-09 DIAGNOSIS — L570 Actinic keratosis: Secondary | ICD-10-CM | POA: Diagnosis not present

## 2017-02-09 DIAGNOSIS — J301 Allergic rhinitis due to pollen: Secondary | ICD-10-CM | POA: Diagnosis not present

## 2017-02-09 DIAGNOSIS — J3089 Other allergic rhinitis: Secondary | ICD-10-CM | POA: Diagnosis not present

## 2017-02-16 DIAGNOSIS — J301 Allergic rhinitis due to pollen: Secondary | ICD-10-CM | POA: Diagnosis not present

## 2017-02-16 DIAGNOSIS — J3089 Other allergic rhinitis: Secondary | ICD-10-CM | POA: Diagnosis not present

## 2017-02-16 DIAGNOSIS — J3081 Allergic rhinitis due to animal (cat) (dog) hair and dander: Secondary | ICD-10-CM | POA: Diagnosis not present

## 2017-02-23 DIAGNOSIS — Z1211 Encounter for screening for malignant neoplasm of colon: Secondary | ICD-10-CM | POA: Diagnosis not present

## 2017-02-23 DIAGNOSIS — I10 Essential (primary) hypertension: Secondary | ICD-10-CM | POA: Diagnosis not present

## 2017-02-23 DIAGNOSIS — Z1331 Encounter for screening for depression: Secondary | ICD-10-CM | POA: Diagnosis not present

## 2017-02-23 DIAGNOSIS — J3089 Other allergic rhinitis: Secondary | ICD-10-CM | POA: Diagnosis not present

## 2017-02-23 DIAGNOSIS — E039 Hypothyroidism, unspecified: Secondary | ICD-10-CM | POA: Diagnosis not present

## 2017-02-23 DIAGNOSIS — Z0000 Encounter for general adult medical examination without abnormal findings: Secondary | ICD-10-CM | POA: Diagnosis not present

## 2017-02-23 DIAGNOSIS — G629 Polyneuropathy, unspecified: Secondary | ICD-10-CM | POA: Diagnosis not present

## 2017-02-23 DIAGNOSIS — R739 Hyperglycemia, unspecified: Secondary | ICD-10-CM | POA: Diagnosis not present

## 2017-02-23 DIAGNOSIS — Z6831 Body mass index (BMI) 31.0-31.9, adult: Secondary | ICD-10-CM | POA: Diagnosis not present

## 2017-02-23 DIAGNOSIS — J3081 Allergic rhinitis due to animal (cat) (dog) hair and dander: Secondary | ICD-10-CM | POA: Diagnosis not present

## 2017-02-23 DIAGNOSIS — Z8673 Personal history of transient ischemic attack (TIA), and cerebral infarction without residual deficits: Secondary | ICD-10-CM | POA: Diagnosis not present

## 2017-02-23 DIAGNOSIS — J301 Allergic rhinitis due to pollen: Secondary | ICD-10-CM | POA: Diagnosis not present

## 2017-02-23 DIAGNOSIS — I679 Cerebrovascular disease, unspecified: Secondary | ICD-10-CM | POA: Diagnosis not present

## 2017-03-05 DIAGNOSIS — M5136 Other intervertebral disc degeneration, lumbar region: Secondary | ICD-10-CM | POA: Diagnosis not present

## 2017-03-05 DIAGNOSIS — M62830 Muscle spasm of back: Secondary | ICD-10-CM | POA: Diagnosis not present

## 2017-03-05 DIAGNOSIS — M545 Low back pain: Secondary | ICD-10-CM | POA: Diagnosis not present

## 2017-03-05 DIAGNOSIS — M9903 Segmental and somatic dysfunction of lumbar region: Secondary | ICD-10-CM | POA: Diagnosis not present

## 2017-03-09 DIAGNOSIS — J3089 Other allergic rhinitis: Secondary | ICD-10-CM | POA: Diagnosis not present

## 2017-03-09 DIAGNOSIS — J3081 Allergic rhinitis due to animal (cat) (dog) hair and dander: Secondary | ICD-10-CM | POA: Diagnosis not present

## 2017-03-09 DIAGNOSIS — J301 Allergic rhinitis due to pollen: Secondary | ICD-10-CM | POA: Diagnosis not present

## 2017-03-10 DIAGNOSIS — K219 Gastro-esophageal reflux disease without esophagitis: Secondary | ICD-10-CM | POA: Diagnosis not present

## 2017-03-10 DIAGNOSIS — Z1211 Encounter for screening for malignant neoplasm of colon: Secondary | ICD-10-CM | POA: Diagnosis not present

## 2017-03-10 DIAGNOSIS — Z8679 Personal history of other diseases of the circulatory system: Secondary | ICD-10-CM | POA: Diagnosis not present

## 2017-03-10 DIAGNOSIS — Z8673 Personal history of transient ischemic attack (TIA), and cerebral infarction without residual deficits: Secondary | ICD-10-CM | POA: Diagnosis not present

## 2017-03-12 DIAGNOSIS — I10 Essential (primary) hypertension: Secondary | ICD-10-CM | POA: Diagnosis not present

## 2017-03-12 DIAGNOSIS — I2510 Atherosclerotic heart disease of native coronary artery without angina pectoris: Secondary | ICD-10-CM | POA: Diagnosis not present

## 2017-03-12 DIAGNOSIS — E7800 Pure hypercholesterolemia, unspecified: Secondary | ICD-10-CM | POA: Diagnosis not present

## 2017-03-12 DIAGNOSIS — Z8673 Personal history of transient ischemic attack (TIA), and cerebral infarction without residual deficits: Secondary | ICD-10-CM | POA: Diagnosis not present

## 2017-03-18 DIAGNOSIS — J301 Allergic rhinitis due to pollen: Secondary | ICD-10-CM | POA: Diagnosis not present

## 2017-03-18 DIAGNOSIS — J3089 Other allergic rhinitis: Secondary | ICD-10-CM | POA: Diagnosis not present

## 2017-03-18 DIAGNOSIS — J3081 Allergic rhinitis due to animal (cat) (dog) hair and dander: Secondary | ICD-10-CM | POA: Diagnosis not present

## 2017-03-23 DIAGNOSIS — J301 Allergic rhinitis due to pollen: Secondary | ICD-10-CM | POA: Diagnosis not present

## 2017-03-23 DIAGNOSIS — J3081 Allergic rhinitis due to animal (cat) (dog) hair and dander: Secondary | ICD-10-CM | POA: Diagnosis not present

## 2017-03-23 DIAGNOSIS — J3089 Other allergic rhinitis: Secondary | ICD-10-CM | POA: Diagnosis not present

## 2017-04-02 DIAGNOSIS — J301 Allergic rhinitis due to pollen: Secondary | ICD-10-CM | POA: Diagnosis not present

## 2017-04-02 DIAGNOSIS — J3089 Other allergic rhinitis: Secondary | ICD-10-CM | POA: Diagnosis not present

## 2017-04-02 DIAGNOSIS — J3081 Allergic rhinitis due to animal (cat) (dog) hair and dander: Secondary | ICD-10-CM | POA: Diagnosis not present

## 2017-04-03 DIAGNOSIS — L570 Actinic keratosis: Secondary | ICD-10-CM | POA: Diagnosis not present

## 2017-04-07 DIAGNOSIS — K635 Polyp of colon: Secondary | ICD-10-CM | POA: Diagnosis not present

## 2017-04-07 DIAGNOSIS — K2930 Chronic superficial gastritis without bleeding: Secondary | ICD-10-CM | POA: Diagnosis not present

## 2017-04-07 DIAGNOSIS — Z1211 Encounter for screening for malignant neoplasm of colon: Secondary | ICD-10-CM | POA: Diagnosis not present

## 2017-04-07 DIAGNOSIS — J3089 Other allergic rhinitis: Secondary | ICD-10-CM | POA: Diagnosis not present

## 2017-04-07 DIAGNOSIS — J3081 Allergic rhinitis due to animal (cat) (dog) hair and dander: Secondary | ICD-10-CM | POA: Diagnosis not present

## 2017-04-07 DIAGNOSIS — K228 Other specified diseases of esophagus: Secondary | ICD-10-CM | POA: Diagnosis not present

## 2017-04-07 DIAGNOSIS — K449 Diaphragmatic hernia without obstruction or gangrene: Secondary | ICD-10-CM | POA: Diagnosis not present

## 2017-04-07 DIAGNOSIS — D126 Benign neoplasm of colon, unspecified: Secondary | ICD-10-CM | POA: Diagnosis not present

## 2017-04-07 DIAGNOSIS — K2970 Gastritis, unspecified, without bleeding: Secondary | ICD-10-CM | POA: Diagnosis not present

## 2017-04-07 DIAGNOSIS — J301 Allergic rhinitis due to pollen: Secondary | ICD-10-CM | POA: Diagnosis not present

## 2017-04-07 DIAGNOSIS — Z13810 Encounter for screening for upper gastrointestinal disorder: Secondary | ICD-10-CM | POA: Diagnosis not present

## 2017-04-07 DIAGNOSIS — K219 Gastro-esophageal reflux disease without esophagitis: Secondary | ICD-10-CM | POA: Diagnosis not present

## 2017-04-09 DIAGNOSIS — K2930 Chronic superficial gastritis without bleeding: Secondary | ICD-10-CM | POA: Diagnosis not present

## 2017-04-09 DIAGNOSIS — D126 Benign neoplasm of colon, unspecified: Secondary | ICD-10-CM | POA: Diagnosis not present

## 2017-04-09 DIAGNOSIS — M545 Low back pain: Secondary | ICD-10-CM | POA: Diagnosis not present

## 2017-04-09 DIAGNOSIS — K635 Polyp of colon: Secondary | ICD-10-CM | POA: Diagnosis not present

## 2017-04-09 DIAGNOSIS — J3089 Other allergic rhinitis: Secondary | ICD-10-CM | POA: Diagnosis not present

## 2017-04-09 DIAGNOSIS — J3081 Allergic rhinitis due to animal (cat) (dog) hair and dander: Secondary | ICD-10-CM | POA: Diagnosis not present

## 2017-04-09 DIAGNOSIS — M5136 Other intervertebral disc degeneration, lumbar region: Secondary | ICD-10-CM | POA: Diagnosis not present

## 2017-04-09 DIAGNOSIS — M9903 Segmental and somatic dysfunction of lumbar region: Secondary | ICD-10-CM | POA: Diagnosis not present

## 2017-04-09 DIAGNOSIS — J301 Allergic rhinitis due to pollen: Secondary | ICD-10-CM | POA: Diagnosis not present

## 2017-04-09 DIAGNOSIS — M62830 Muscle spasm of back: Secondary | ICD-10-CM | POA: Diagnosis not present

## 2017-04-09 DIAGNOSIS — K219 Gastro-esophageal reflux disease without esophagitis: Secondary | ICD-10-CM | POA: Diagnosis not present

## 2017-04-13 DIAGNOSIS — J3089 Other allergic rhinitis: Secondary | ICD-10-CM | POA: Diagnosis not present

## 2017-04-13 DIAGNOSIS — J301 Allergic rhinitis due to pollen: Secondary | ICD-10-CM | POA: Diagnosis not present

## 2017-04-13 DIAGNOSIS — J3081 Allergic rhinitis due to animal (cat) (dog) hair and dander: Secondary | ICD-10-CM | POA: Diagnosis not present

## 2017-04-16 DIAGNOSIS — J3089 Other allergic rhinitis: Secondary | ICD-10-CM | POA: Diagnosis not present

## 2017-04-16 DIAGNOSIS — J301 Allergic rhinitis due to pollen: Secondary | ICD-10-CM | POA: Diagnosis not present

## 2017-04-16 DIAGNOSIS — J3081 Allergic rhinitis due to animal (cat) (dog) hair and dander: Secondary | ICD-10-CM | POA: Diagnosis not present

## 2017-04-20 DIAGNOSIS — J3081 Allergic rhinitis due to animal (cat) (dog) hair and dander: Secondary | ICD-10-CM | POA: Diagnosis not present

## 2017-04-20 DIAGNOSIS — J3089 Other allergic rhinitis: Secondary | ICD-10-CM | POA: Diagnosis not present

## 2017-04-20 DIAGNOSIS — J301 Allergic rhinitis due to pollen: Secondary | ICD-10-CM | POA: Diagnosis not present

## 2017-04-23 DIAGNOSIS — J3089 Other allergic rhinitis: Secondary | ICD-10-CM | POA: Diagnosis not present

## 2017-04-23 DIAGNOSIS — J301 Allergic rhinitis due to pollen: Secondary | ICD-10-CM | POA: Diagnosis not present

## 2017-04-23 DIAGNOSIS — J3081 Allergic rhinitis due to animal (cat) (dog) hair and dander: Secondary | ICD-10-CM | POA: Diagnosis not present

## 2017-04-29 DIAGNOSIS — J301 Allergic rhinitis due to pollen: Secondary | ICD-10-CM | POA: Diagnosis not present

## 2017-04-29 DIAGNOSIS — J3089 Other allergic rhinitis: Secondary | ICD-10-CM | POA: Diagnosis not present

## 2017-04-29 DIAGNOSIS — J3081 Allergic rhinitis due to animal (cat) (dog) hair and dander: Secondary | ICD-10-CM | POA: Diagnosis not present

## 2017-05-04 DIAGNOSIS — J3089 Other allergic rhinitis: Secondary | ICD-10-CM | POA: Diagnosis not present

## 2017-05-04 DIAGNOSIS — J301 Allergic rhinitis due to pollen: Secondary | ICD-10-CM | POA: Diagnosis not present

## 2017-05-04 DIAGNOSIS — J3081 Allergic rhinitis due to animal (cat) (dog) hair and dander: Secondary | ICD-10-CM | POA: Diagnosis not present

## 2017-05-05 DIAGNOSIS — E785 Hyperlipidemia, unspecified: Secondary | ICD-10-CM | POA: Diagnosis not present

## 2017-05-05 DIAGNOSIS — I10 Essential (primary) hypertension: Secondary | ICD-10-CM | POA: Diagnosis not present

## 2017-05-05 DIAGNOSIS — I679 Cerebrovascular disease, unspecified: Secondary | ICD-10-CM | POA: Diagnosis not present

## 2017-05-05 DIAGNOSIS — G629 Polyneuropathy, unspecified: Secondary | ICD-10-CM | POA: Diagnosis not present

## 2017-05-05 DIAGNOSIS — E039 Hypothyroidism, unspecified: Secondary | ICD-10-CM | POA: Diagnosis not present

## 2017-05-07 DIAGNOSIS — M5136 Other intervertebral disc degeneration, lumbar region: Secondary | ICD-10-CM | POA: Diagnosis not present

## 2017-05-07 DIAGNOSIS — M545 Low back pain: Secondary | ICD-10-CM | POA: Diagnosis not present

## 2017-05-07 DIAGNOSIS — M9903 Segmental and somatic dysfunction of lumbar region: Secondary | ICD-10-CM | POA: Diagnosis not present

## 2017-05-07 DIAGNOSIS — M62830 Muscle spasm of back: Secondary | ICD-10-CM | POA: Diagnosis not present

## 2017-05-11 DIAGNOSIS — J3089 Other allergic rhinitis: Secondary | ICD-10-CM | POA: Diagnosis not present

## 2017-05-11 DIAGNOSIS — J301 Allergic rhinitis due to pollen: Secondary | ICD-10-CM | POA: Diagnosis not present

## 2017-05-19 DIAGNOSIS — J3081 Allergic rhinitis due to animal (cat) (dog) hair and dander: Secondary | ICD-10-CM | POA: Diagnosis not present

## 2017-05-19 DIAGNOSIS — J3089 Other allergic rhinitis: Secondary | ICD-10-CM | POA: Diagnosis not present

## 2017-05-19 DIAGNOSIS — J301 Allergic rhinitis due to pollen: Secondary | ICD-10-CM | POA: Diagnosis not present

## 2017-05-25 DIAGNOSIS — J301 Allergic rhinitis due to pollen: Secondary | ICD-10-CM | POA: Diagnosis not present

## 2017-05-25 DIAGNOSIS — J3081 Allergic rhinitis due to animal (cat) (dog) hair and dander: Secondary | ICD-10-CM | POA: Diagnosis not present

## 2017-05-25 DIAGNOSIS — J3089 Other allergic rhinitis: Secondary | ICD-10-CM | POA: Diagnosis not present

## 2017-05-28 DIAGNOSIS — D485 Neoplasm of uncertain behavior of skin: Secondary | ICD-10-CM | POA: Diagnosis not present

## 2017-05-28 DIAGNOSIS — L570 Actinic keratosis: Secondary | ICD-10-CM | POA: Diagnosis not present

## 2017-06-02 DIAGNOSIS — J3081 Allergic rhinitis due to animal (cat) (dog) hair and dander: Secondary | ICD-10-CM | POA: Diagnosis not present

## 2017-06-02 DIAGNOSIS — J301 Allergic rhinitis due to pollen: Secondary | ICD-10-CM | POA: Diagnosis not present

## 2017-06-09 DIAGNOSIS — J3089 Other allergic rhinitis: Secondary | ICD-10-CM | POA: Diagnosis not present

## 2017-06-09 DIAGNOSIS — J3081 Allergic rhinitis due to animal (cat) (dog) hair and dander: Secondary | ICD-10-CM | POA: Diagnosis not present

## 2017-06-09 DIAGNOSIS — M9903 Segmental and somatic dysfunction of lumbar region: Secondary | ICD-10-CM | POA: Diagnosis not present

## 2017-06-09 DIAGNOSIS — M5136 Other intervertebral disc degeneration, lumbar region: Secondary | ICD-10-CM | POA: Diagnosis not present

## 2017-06-09 DIAGNOSIS — M62830 Muscle spasm of back: Secondary | ICD-10-CM | POA: Diagnosis not present

## 2017-06-09 DIAGNOSIS — J301 Allergic rhinitis due to pollen: Secondary | ICD-10-CM | POA: Diagnosis not present

## 2017-06-09 DIAGNOSIS — M545 Low back pain: Secondary | ICD-10-CM | POA: Diagnosis not present

## 2017-06-15 DIAGNOSIS — J3089 Other allergic rhinitis: Secondary | ICD-10-CM | POA: Diagnosis not present

## 2017-06-15 DIAGNOSIS — J3081 Allergic rhinitis due to animal (cat) (dog) hair and dander: Secondary | ICD-10-CM | POA: Diagnosis not present

## 2017-06-15 DIAGNOSIS — J301 Allergic rhinitis due to pollen: Secondary | ICD-10-CM | POA: Diagnosis not present

## 2017-06-22 DIAGNOSIS — J301 Allergic rhinitis due to pollen: Secondary | ICD-10-CM | POA: Diagnosis not present

## 2017-06-22 DIAGNOSIS — J3081 Allergic rhinitis due to animal (cat) (dog) hair and dander: Secondary | ICD-10-CM | POA: Diagnosis not present

## 2017-06-22 DIAGNOSIS — J3089 Other allergic rhinitis: Secondary | ICD-10-CM | POA: Diagnosis not present

## 2017-06-23 DIAGNOSIS — J3081 Allergic rhinitis due to animal (cat) (dog) hair and dander: Secondary | ICD-10-CM | POA: Diagnosis not present

## 2017-06-29 DIAGNOSIS — J301 Allergic rhinitis due to pollen: Secondary | ICD-10-CM | POA: Diagnosis not present

## 2017-06-29 DIAGNOSIS — J3081 Allergic rhinitis due to animal (cat) (dog) hair and dander: Secondary | ICD-10-CM | POA: Diagnosis not present

## 2017-06-29 DIAGNOSIS — J3089 Other allergic rhinitis: Secondary | ICD-10-CM | POA: Diagnosis not present

## 2017-07-06 DIAGNOSIS — J3081 Allergic rhinitis due to animal (cat) (dog) hair and dander: Secondary | ICD-10-CM | POA: Diagnosis not present

## 2017-07-06 DIAGNOSIS — J3089 Other allergic rhinitis: Secondary | ICD-10-CM | POA: Diagnosis not present

## 2017-07-06 DIAGNOSIS — J301 Allergic rhinitis due to pollen: Secondary | ICD-10-CM | POA: Diagnosis not present

## 2017-07-07 DIAGNOSIS — M545 Low back pain: Secondary | ICD-10-CM | POA: Diagnosis not present

## 2017-07-07 DIAGNOSIS — M9903 Segmental and somatic dysfunction of lumbar region: Secondary | ICD-10-CM | POA: Diagnosis not present

## 2017-07-07 DIAGNOSIS — M5136 Other intervertebral disc degeneration, lumbar region: Secondary | ICD-10-CM | POA: Diagnosis not present

## 2017-07-07 DIAGNOSIS — M62830 Muscle spasm of back: Secondary | ICD-10-CM | POA: Diagnosis not present

## 2017-07-10 DIAGNOSIS — C4492 Squamous cell carcinoma of skin, unspecified: Secondary | ICD-10-CM

## 2017-07-10 HISTORY — DX: Squamous cell carcinoma of skin, unspecified: C44.92

## 2017-07-14 DIAGNOSIS — J301 Allergic rhinitis due to pollen: Secondary | ICD-10-CM | POA: Diagnosis not present

## 2017-07-14 DIAGNOSIS — J3089 Other allergic rhinitis: Secondary | ICD-10-CM | POA: Diagnosis not present

## 2017-07-14 DIAGNOSIS — J3081 Allergic rhinitis due to animal (cat) (dog) hair and dander: Secondary | ICD-10-CM | POA: Diagnosis not present

## 2017-07-20 DIAGNOSIS — J3089 Other allergic rhinitis: Secondary | ICD-10-CM | POA: Diagnosis not present

## 2017-07-20 DIAGNOSIS — J301 Allergic rhinitis due to pollen: Secondary | ICD-10-CM | POA: Diagnosis not present

## 2017-07-20 DIAGNOSIS — J3081 Allergic rhinitis due to animal (cat) (dog) hair and dander: Secondary | ICD-10-CM | POA: Diagnosis not present

## 2017-07-27 DIAGNOSIS — J3081 Allergic rhinitis due to animal (cat) (dog) hair and dander: Secondary | ICD-10-CM | POA: Diagnosis not present

## 2017-07-27 DIAGNOSIS — L570 Actinic keratosis: Secondary | ICD-10-CM | POA: Diagnosis not present

## 2017-07-27 DIAGNOSIS — J3089 Other allergic rhinitis: Secondary | ICD-10-CM | POA: Diagnosis not present

## 2017-07-27 DIAGNOSIS — J301 Allergic rhinitis due to pollen: Secondary | ICD-10-CM | POA: Diagnosis not present

## 2017-08-03 DIAGNOSIS — J301 Allergic rhinitis due to pollen: Secondary | ICD-10-CM | POA: Diagnosis not present

## 2017-08-03 DIAGNOSIS — J3089 Other allergic rhinitis: Secondary | ICD-10-CM | POA: Diagnosis not present

## 2017-08-03 DIAGNOSIS — J3081 Allergic rhinitis due to animal (cat) (dog) hair and dander: Secondary | ICD-10-CM | POA: Diagnosis not present

## 2017-08-04 DIAGNOSIS — J3089 Other allergic rhinitis: Secondary | ICD-10-CM | POA: Diagnosis not present

## 2017-08-04 DIAGNOSIS — J301 Allergic rhinitis due to pollen: Secondary | ICD-10-CM | POA: Diagnosis not present

## 2017-08-04 DIAGNOSIS — J3081 Allergic rhinitis due to animal (cat) (dog) hair and dander: Secondary | ICD-10-CM | POA: Diagnosis not present

## 2017-08-10 DIAGNOSIS — J3089 Other allergic rhinitis: Secondary | ICD-10-CM | POA: Diagnosis not present

## 2017-08-10 DIAGNOSIS — J301 Allergic rhinitis due to pollen: Secondary | ICD-10-CM | POA: Diagnosis not present

## 2017-08-10 DIAGNOSIS — J3081 Allergic rhinitis due to animal (cat) (dog) hair and dander: Secondary | ICD-10-CM | POA: Diagnosis not present

## 2017-08-11 DIAGNOSIS — M9903 Segmental and somatic dysfunction of lumbar region: Secondary | ICD-10-CM | POA: Diagnosis not present

## 2017-08-11 DIAGNOSIS — M62830 Muscle spasm of back: Secondary | ICD-10-CM | POA: Diagnosis not present

## 2017-08-11 DIAGNOSIS — M545 Low back pain: Secondary | ICD-10-CM | POA: Diagnosis not present

## 2017-08-11 DIAGNOSIS — M5136 Other intervertebral disc degeneration, lumbar region: Secondary | ICD-10-CM | POA: Diagnosis not present

## 2017-08-18 DIAGNOSIS — J3089 Other allergic rhinitis: Secondary | ICD-10-CM | POA: Diagnosis not present

## 2017-08-18 DIAGNOSIS — J3081 Allergic rhinitis due to animal (cat) (dog) hair and dander: Secondary | ICD-10-CM | POA: Diagnosis not present

## 2017-08-18 DIAGNOSIS — J301 Allergic rhinitis due to pollen: Secondary | ICD-10-CM | POA: Diagnosis not present

## 2017-08-19 DIAGNOSIS — R1011 Right upper quadrant pain: Secondary | ICD-10-CM | POA: Diagnosis not present

## 2017-08-21 ENCOUNTER — Other Ambulatory Visit: Payer: Self-pay | Admitting: Family Medicine

## 2017-08-21 DIAGNOSIS — R1011 Right upper quadrant pain: Secondary | ICD-10-CM

## 2017-08-24 DIAGNOSIS — J3081 Allergic rhinitis due to animal (cat) (dog) hair and dander: Secondary | ICD-10-CM | POA: Diagnosis not present

## 2017-08-24 DIAGNOSIS — J3089 Other allergic rhinitis: Secondary | ICD-10-CM | POA: Diagnosis not present

## 2017-08-24 DIAGNOSIS — J301 Allergic rhinitis due to pollen: Secondary | ICD-10-CM | POA: Diagnosis not present

## 2017-08-26 ENCOUNTER — Ambulatory Visit
Admission: RE | Admit: 2017-08-26 | Discharge: 2017-08-26 | Disposition: A | Discharge status: Home / Self Care | Payer: Medicare Other | Source: Ambulatory Visit | Attending: Family Medicine | Admitting: Family Medicine

## 2017-08-26 DIAGNOSIS — R1011 Right upper quadrant pain: Secondary | ICD-10-CM

## 2017-08-26 DIAGNOSIS — K7689 Other specified diseases of liver: Secondary | ICD-10-CM | POA: Diagnosis not present

## 2017-09-01 DIAGNOSIS — J0141 Acute recurrent pansinusitis: Secondary | ICD-10-CM | POA: Diagnosis not present

## 2017-09-08 DIAGNOSIS — J3089 Other allergic rhinitis: Secondary | ICD-10-CM | POA: Diagnosis not present

## 2017-09-08 DIAGNOSIS — J301 Allergic rhinitis due to pollen: Secondary | ICD-10-CM | POA: Diagnosis not present

## 2017-09-08 DIAGNOSIS — J3081 Allergic rhinitis due to animal (cat) (dog) hair and dander: Secondary | ICD-10-CM | POA: Diagnosis not present

## 2017-09-16 DIAGNOSIS — J3089 Other allergic rhinitis: Secondary | ICD-10-CM | POA: Diagnosis not present

## 2017-09-16 DIAGNOSIS — J301 Allergic rhinitis due to pollen: Secondary | ICD-10-CM | POA: Diagnosis not present

## 2017-09-16 DIAGNOSIS — J3081 Allergic rhinitis due to animal (cat) (dog) hair and dander: Secondary | ICD-10-CM | POA: Diagnosis not present

## 2017-09-22 DIAGNOSIS — J301 Allergic rhinitis due to pollen: Secondary | ICD-10-CM | POA: Diagnosis not present

## 2017-09-22 DIAGNOSIS — J3089 Other allergic rhinitis: Secondary | ICD-10-CM | POA: Diagnosis not present

## 2017-09-22 DIAGNOSIS — J3081 Allergic rhinitis due to animal (cat) (dog) hair and dander: Secondary | ICD-10-CM | POA: Diagnosis not present

## 2017-09-29 DIAGNOSIS — J301 Allergic rhinitis due to pollen: Secondary | ICD-10-CM | POA: Diagnosis not present

## 2017-09-29 DIAGNOSIS — M545 Low back pain: Secondary | ICD-10-CM | POA: Diagnosis not present

## 2017-09-29 DIAGNOSIS — J3089 Other allergic rhinitis: Secondary | ICD-10-CM | POA: Diagnosis not present

## 2017-09-29 DIAGNOSIS — M5136 Other intervertebral disc degeneration, lumbar region: Secondary | ICD-10-CM | POA: Diagnosis not present

## 2017-09-29 DIAGNOSIS — J3081 Allergic rhinitis due to animal (cat) (dog) hair and dander: Secondary | ICD-10-CM | POA: Diagnosis not present

## 2017-09-29 DIAGNOSIS — M62830 Muscle spasm of back: Secondary | ICD-10-CM | POA: Diagnosis not present

## 2017-09-29 DIAGNOSIS — M9903 Segmental and somatic dysfunction of lumbar region: Secondary | ICD-10-CM | POA: Diagnosis not present

## 2017-09-29 DIAGNOSIS — R0989 Other specified symptoms and signs involving the circulatory and respiratory systems: Secondary | ICD-10-CM | POA: Diagnosis not present

## 2017-10-05 DIAGNOSIS — J3089 Other allergic rhinitis: Secondary | ICD-10-CM | POA: Diagnosis not present

## 2017-10-05 DIAGNOSIS — J3081 Allergic rhinitis due to animal (cat) (dog) hair and dander: Secondary | ICD-10-CM | POA: Diagnosis not present

## 2017-10-05 DIAGNOSIS — J301 Allergic rhinitis due to pollen: Secondary | ICD-10-CM | POA: Diagnosis not present

## 2017-10-12 DIAGNOSIS — J301 Allergic rhinitis due to pollen: Secondary | ICD-10-CM | POA: Diagnosis not present

## 2017-10-12 DIAGNOSIS — J3081 Allergic rhinitis due to animal (cat) (dog) hair and dander: Secondary | ICD-10-CM | POA: Diagnosis not present

## 2017-10-12 DIAGNOSIS — J3089 Other allergic rhinitis: Secondary | ICD-10-CM | POA: Diagnosis not present

## 2017-10-19 DIAGNOSIS — J3089 Other allergic rhinitis: Secondary | ICD-10-CM | POA: Diagnosis not present

## 2017-10-19 DIAGNOSIS — J301 Allergic rhinitis due to pollen: Secondary | ICD-10-CM | POA: Diagnosis not present

## 2017-10-19 DIAGNOSIS — J3081 Allergic rhinitis due to animal (cat) (dog) hair and dander: Secondary | ICD-10-CM | POA: Diagnosis not present

## 2017-10-26 DIAGNOSIS — J301 Allergic rhinitis due to pollen: Secondary | ICD-10-CM | POA: Diagnosis not present

## 2017-10-26 DIAGNOSIS — J3089 Other allergic rhinitis: Secondary | ICD-10-CM | POA: Diagnosis not present

## 2017-10-26 DIAGNOSIS — J3081 Allergic rhinitis due to animal (cat) (dog) hair and dander: Secondary | ICD-10-CM | POA: Diagnosis not present

## 2017-10-27 DIAGNOSIS — M5136 Other intervertebral disc degeneration, lumbar region: Secondary | ICD-10-CM | POA: Diagnosis not present

## 2017-10-27 DIAGNOSIS — M62830 Muscle spasm of back: Secondary | ICD-10-CM | POA: Diagnosis not present

## 2017-10-27 DIAGNOSIS — M545 Low back pain: Secondary | ICD-10-CM | POA: Diagnosis not present

## 2017-10-27 DIAGNOSIS — M9903 Segmental and somatic dysfunction of lumbar region: Secondary | ICD-10-CM | POA: Diagnosis not present

## 2017-11-03 DIAGNOSIS — G479 Sleep disorder, unspecified: Secondary | ICD-10-CM | POA: Diagnosis not present

## 2017-11-03 DIAGNOSIS — E039 Hypothyroidism, unspecified: Secondary | ICD-10-CM | POA: Diagnosis not present

## 2017-11-03 DIAGNOSIS — K219 Gastro-esophageal reflux disease without esophagitis: Secondary | ICD-10-CM | POA: Diagnosis not present

## 2017-11-03 DIAGNOSIS — J3089 Other allergic rhinitis: Secondary | ICD-10-CM | POA: Diagnosis not present

## 2017-11-03 DIAGNOSIS — I679 Cerebrovascular disease, unspecified: Secondary | ICD-10-CM | POA: Diagnosis not present

## 2017-11-03 DIAGNOSIS — J3081 Allergic rhinitis due to animal (cat) (dog) hair and dander: Secondary | ICD-10-CM | POA: Diagnosis not present

## 2017-11-03 DIAGNOSIS — J301 Allergic rhinitis due to pollen: Secondary | ICD-10-CM | POA: Diagnosis not present

## 2017-11-03 DIAGNOSIS — Z23 Encounter for immunization: Secondary | ICD-10-CM | POA: Diagnosis not present

## 2017-11-03 DIAGNOSIS — I10 Essential (primary) hypertension: Secondary | ICD-10-CM | POA: Diagnosis not present

## 2017-11-03 DIAGNOSIS — E785 Hyperlipidemia, unspecified: Secondary | ICD-10-CM | POA: Diagnosis not present

## 2017-11-09 DIAGNOSIS — J3089 Other allergic rhinitis: Secondary | ICD-10-CM | POA: Diagnosis not present

## 2017-11-09 DIAGNOSIS — J3081 Allergic rhinitis due to animal (cat) (dog) hair and dander: Secondary | ICD-10-CM | POA: Diagnosis not present

## 2017-11-09 DIAGNOSIS — J301 Allergic rhinitis due to pollen: Secondary | ICD-10-CM | POA: Diagnosis not present

## 2017-11-16 DIAGNOSIS — J3089 Other allergic rhinitis: Secondary | ICD-10-CM | POA: Diagnosis not present

## 2017-11-16 DIAGNOSIS — J3081 Allergic rhinitis due to animal (cat) (dog) hair and dander: Secondary | ICD-10-CM | POA: Diagnosis not present

## 2017-11-16 DIAGNOSIS — J301 Allergic rhinitis due to pollen: Secondary | ICD-10-CM | POA: Diagnosis not present

## 2017-11-24 DIAGNOSIS — J3089 Other allergic rhinitis: Secondary | ICD-10-CM | POA: Diagnosis not present

## 2017-11-24 DIAGNOSIS — J301 Allergic rhinitis due to pollen: Secondary | ICD-10-CM | POA: Diagnosis not present

## 2017-11-24 DIAGNOSIS — J3081 Allergic rhinitis due to animal (cat) (dog) hair and dander: Secondary | ICD-10-CM | POA: Diagnosis not present

## 2017-12-01 DIAGNOSIS — M62830 Muscle spasm of back: Secondary | ICD-10-CM | POA: Diagnosis not present

## 2017-12-01 DIAGNOSIS — J301 Allergic rhinitis due to pollen: Secondary | ICD-10-CM | POA: Diagnosis not present

## 2017-12-01 DIAGNOSIS — M9903 Segmental and somatic dysfunction of lumbar region: Secondary | ICD-10-CM | POA: Diagnosis not present

## 2017-12-01 DIAGNOSIS — J3089 Other allergic rhinitis: Secondary | ICD-10-CM | POA: Diagnosis not present

## 2017-12-01 DIAGNOSIS — M5136 Other intervertebral disc degeneration, lumbar region: Secondary | ICD-10-CM | POA: Diagnosis not present

## 2017-12-01 DIAGNOSIS — J3081 Allergic rhinitis due to animal (cat) (dog) hair and dander: Secondary | ICD-10-CM | POA: Diagnosis not present

## 2017-12-01 DIAGNOSIS — M545 Low back pain: Secondary | ICD-10-CM | POA: Diagnosis not present

## 2017-12-03 DIAGNOSIS — Z23 Encounter for immunization: Secondary | ICD-10-CM | POA: Diagnosis not present

## 2017-12-07 DIAGNOSIS — J3089 Other allergic rhinitis: Secondary | ICD-10-CM | POA: Diagnosis not present

## 2017-12-07 DIAGNOSIS — J3081 Allergic rhinitis due to animal (cat) (dog) hair and dander: Secondary | ICD-10-CM | POA: Diagnosis not present

## 2017-12-07 DIAGNOSIS — J301 Allergic rhinitis due to pollen: Secondary | ICD-10-CM | POA: Diagnosis not present

## 2017-12-14 DIAGNOSIS — J301 Allergic rhinitis due to pollen: Secondary | ICD-10-CM | POA: Diagnosis not present

## 2017-12-14 DIAGNOSIS — J3081 Allergic rhinitis due to animal (cat) (dog) hair and dander: Secondary | ICD-10-CM | POA: Diagnosis not present

## 2017-12-14 DIAGNOSIS — J3089 Other allergic rhinitis: Secondary | ICD-10-CM | POA: Diagnosis not present

## 2017-12-23 DIAGNOSIS — J3081 Allergic rhinitis due to animal (cat) (dog) hair and dander: Secondary | ICD-10-CM | POA: Diagnosis not present

## 2017-12-23 DIAGNOSIS — J3089 Other allergic rhinitis: Secondary | ICD-10-CM | POA: Diagnosis not present

## 2017-12-23 DIAGNOSIS — H1045 Other chronic allergic conjunctivitis: Secondary | ICD-10-CM | POA: Diagnosis not present

## 2017-12-23 DIAGNOSIS — J301 Allergic rhinitis due to pollen: Secondary | ICD-10-CM | POA: Diagnosis not present

## 2017-12-24 DIAGNOSIS — J3081 Allergic rhinitis due to animal (cat) (dog) hair and dander: Secondary | ICD-10-CM | POA: Diagnosis not present

## 2017-12-28 DIAGNOSIS — J301 Allergic rhinitis due to pollen: Secondary | ICD-10-CM | POA: Diagnosis not present

## 2017-12-28 DIAGNOSIS — J3089 Other allergic rhinitis: Secondary | ICD-10-CM | POA: Diagnosis not present

## 2017-12-28 DIAGNOSIS — J3081 Allergic rhinitis due to animal (cat) (dog) hair and dander: Secondary | ICD-10-CM | POA: Diagnosis not present

## 2018-01-01 DIAGNOSIS — J3089 Other allergic rhinitis: Secondary | ICD-10-CM | POA: Diagnosis not present

## 2018-01-01 DIAGNOSIS — J3081 Allergic rhinitis due to animal (cat) (dog) hair and dander: Secondary | ICD-10-CM | POA: Diagnosis not present

## 2018-01-01 DIAGNOSIS — J301 Allergic rhinitis due to pollen: Secondary | ICD-10-CM | POA: Diagnosis not present

## 2018-01-05 DIAGNOSIS — J3089 Other allergic rhinitis: Secondary | ICD-10-CM | POA: Diagnosis not present

## 2018-01-05 DIAGNOSIS — M5136 Other intervertebral disc degeneration, lumbar region: Secondary | ICD-10-CM | POA: Diagnosis not present

## 2018-01-05 DIAGNOSIS — M545 Low back pain: Secondary | ICD-10-CM | POA: Diagnosis not present

## 2018-01-05 DIAGNOSIS — M9903 Segmental and somatic dysfunction of lumbar region: Secondary | ICD-10-CM | POA: Diagnosis not present

## 2018-01-05 DIAGNOSIS — M62830 Muscle spasm of back: Secondary | ICD-10-CM | POA: Diagnosis not present

## 2018-01-05 DIAGNOSIS — J301 Allergic rhinitis due to pollen: Secondary | ICD-10-CM | POA: Diagnosis not present

## 2018-01-05 DIAGNOSIS — J3081 Allergic rhinitis due to animal (cat) (dog) hair and dander: Secondary | ICD-10-CM | POA: Diagnosis not present

## 2018-01-11 DIAGNOSIS — J3081 Allergic rhinitis due to animal (cat) (dog) hair and dander: Secondary | ICD-10-CM | POA: Diagnosis not present

## 2018-01-11 DIAGNOSIS — J301 Allergic rhinitis due to pollen: Secondary | ICD-10-CM | POA: Diagnosis not present

## 2018-01-11 DIAGNOSIS — J3089 Other allergic rhinitis: Secondary | ICD-10-CM | POA: Diagnosis not present

## 2018-01-14 DIAGNOSIS — D0462 Carcinoma in situ of skin of left upper limb, including shoulder: Secondary | ICD-10-CM | POA: Diagnosis not present

## 2018-01-14 DIAGNOSIS — L570 Actinic keratosis: Secondary | ICD-10-CM | POA: Diagnosis not present

## 2018-01-14 DIAGNOSIS — C44311 Basal cell carcinoma of skin of nose: Secondary | ICD-10-CM | POA: Diagnosis not present

## 2018-01-18 DIAGNOSIS — J3089 Other allergic rhinitis: Secondary | ICD-10-CM | POA: Diagnosis not present

## 2018-01-18 DIAGNOSIS — J3081 Allergic rhinitis due to animal (cat) (dog) hair and dander: Secondary | ICD-10-CM | POA: Diagnosis not present

## 2018-01-18 DIAGNOSIS — J301 Allergic rhinitis due to pollen: Secondary | ICD-10-CM | POA: Diagnosis not present

## 2018-01-29 DIAGNOSIS — J301 Allergic rhinitis due to pollen: Secondary | ICD-10-CM | POA: Diagnosis not present

## 2018-01-29 DIAGNOSIS — J3089 Other allergic rhinitis: Secondary | ICD-10-CM | POA: Diagnosis not present

## 2018-01-29 DIAGNOSIS — J3081 Allergic rhinitis due to animal (cat) (dog) hair and dander: Secondary | ICD-10-CM | POA: Diagnosis not present

## 2018-02-01 DIAGNOSIS — J3081 Allergic rhinitis due to animal (cat) (dog) hair and dander: Secondary | ICD-10-CM | POA: Diagnosis not present

## 2018-02-01 DIAGNOSIS — J3089 Other allergic rhinitis: Secondary | ICD-10-CM | POA: Diagnosis not present

## 2018-02-01 DIAGNOSIS — J301 Allergic rhinitis due to pollen: Secondary | ICD-10-CM | POA: Diagnosis not present

## 2018-02-04 DIAGNOSIS — J3081 Allergic rhinitis due to animal (cat) (dog) hair and dander: Secondary | ICD-10-CM | POA: Diagnosis not present

## 2018-02-04 DIAGNOSIS — M5136 Other intervertebral disc degeneration, lumbar region: Secondary | ICD-10-CM | POA: Diagnosis not present

## 2018-02-04 DIAGNOSIS — J301 Allergic rhinitis due to pollen: Secondary | ICD-10-CM | POA: Diagnosis not present

## 2018-02-04 DIAGNOSIS — M62830 Muscle spasm of back: Secondary | ICD-10-CM | POA: Diagnosis not present

## 2018-02-04 DIAGNOSIS — M9903 Segmental and somatic dysfunction of lumbar region: Secondary | ICD-10-CM | POA: Diagnosis not present

## 2018-02-04 DIAGNOSIS — J3089 Other allergic rhinitis: Secondary | ICD-10-CM | POA: Diagnosis not present

## 2018-02-04 DIAGNOSIS — M545 Low back pain: Secondary | ICD-10-CM | POA: Diagnosis not present

## 2018-02-05 DIAGNOSIS — E039 Hypothyroidism, unspecified: Secondary | ICD-10-CM | POA: Diagnosis not present

## 2018-02-08 DIAGNOSIS — J301 Allergic rhinitis due to pollen: Secondary | ICD-10-CM | POA: Diagnosis not present

## 2018-02-08 DIAGNOSIS — J3081 Allergic rhinitis due to animal (cat) (dog) hair and dander: Secondary | ICD-10-CM | POA: Diagnosis not present

## 2018-02-08 DIAGNOSIS — J3089 Other allergic rhinitis: Secondary | ICD-10-CM | POA: Diagnosis not present

## 2018-02-11 DIAGNOSIS — J3089 Other allergic rhinitis: Secondary | ICD-10-CM | POA: Diagnosis not present

## 2018-02-11 DIAGNOSIS — J301 Allergic rhinitis due to pollen: Secondary | ICD-10-CM | POA: Diagnosis not present

## 2018-02-11 DIAGNOSIS — J3081 Allergic rhinitis due to animal (cat) (dog) hair and dander: Secondary | ICD-10-CM | POA: Diagnosis not present

## 2018-02-15 DIAGNOSIS — J3081 Allergic rhinitis due to animal (cat) (dog) hair and dander: Secondary | ICD-10-CM | POA: Diagnosis not present

## 2018-02-15 DIAGNOSIS — J3089 Other allergic rhinitis: Secondary | ICD-10-CM | POA: Diagnosis not present

## 2018-02-15 DIAGNOSIS — J301 Allergic rhinitis due to pollen: Secondary | ICD-10-CM | POA: Diagnosis not present

## 2018-02-22 DIAGNOSIS — J3081 Allergic rhinitis due to animal (cat) (dog) hair and dander: Secondary | ICD-10-CM | POA: Diagnosis not present

## 2018-02-22 DIAGNOSIS — J301 Allergic rhinitis due to pollen: Secondary | ICD-10-CM | POA: Diagnosis not present

## 2018-02-22 DIAGNOSIS — J3089 Other allergic rhinitis: Secondary | ICD-10-CM | POA: Diagnosis not present

## 2018-02-25 DIAGNOSIS — D0462 Carcinoma in situ of skin of left upper limb, including shoulder: Secondary | ICD-10-CM | POA: Diagnosis not present

## 2018-03-01 DIAGNOSIS — J3089 Other allergic rhinitis: Secondary | ICD-10-CM | POA: Diagnosis not present

## 2018-03-01 DIAGNOSIS — J301 Allergic rhinitis due to pollen: Secondary | ICD-10-CM | POA: Diagnosis not present

## 2018-03-01 DIAGNOSIS — J3081 Allergic rhinitis due to animal (cat) (dog) hair and dander: Secondary | ICD-10-CM | POA: Diagnosis not present

## 2018-03-04 DIAGNOSIS — M62830 Muscle spasm of back: Secondary | ICD-10-CM | POA: Diagnosis not present

## 2018-03-04 DIAGNOSIS — M5136 Other intervertebral disc degeneration, lumbar region: Secondary | ICD-10-CM | POA: Diagnosis not present

## 2018-03-04 DIAGNOSIS — M545 Low back pain: Secondary | ICD-10-CM | POA: Diagnosis not present

## 2018-03-04 DIAGNOSIS — M9903 Segmental and somatic dysfunction of lumbar region: Secondary | ICD-10-CM | POA: Diagnosis not present

## 2018-03-08 DIAGNOSIS — J3089 Other allergic rhinitis: Secondary | ICD-10-CM | POA: Diagnosis not present

## 2018-03-08 DIAGNOSIS — J301 Allergic rhinitis due to pollen: Secondary | ICD-10-CM | POA: Diagnosis not present

## 2018-03-08 DIAGNOSIS — J3081 Allergic rhinitis due to animal (cat) (dog) hair and dander: Secondary | ICD-10-CM | POA: Diagnosis not present

## 2018-03-11 DIAGNOSIS — I10 Essential (primary) hypertension: Secondary | ICD-10-CM | POA: Diagnosis not present

## 2018-03-11 DIAGNOSIS — Z8673 Personal history of transient ischemic attack (TIA), and cerebral infarction without residual deficits: Secondary | ICD-10-CM | POA: Diagnosis not present

## 2018-03-11 DIAGNOSIS — I2510 Atherosclerotic heart disease of native coronary artery without angina pectoris: Secondary | ICD-10-CM | POA: Diagnosis not present

## 2018-03-11 DIAGNOSIS — E7800 Pure hypercholesterolemia, unspecified: Secondary | ICD-10-CM | POA: Diagnosis not present

## 2018-03-15 DIAGNOSIS — H2513 Age-related nuclear cataract, bilateral: Secondary | ICD-10-CM | POA: Diagnosis not present

## 2018-03-15 DIAGNOSIS — J3089 Other allergic rhinitis: Secondary | ICD-10-CM | POA: Diagnosis not present

## 2018-03-15 DIAGNOSIS — J3081 Allergic rhinitis due to animal (cat) (dog) hair and dander: Secondary | ICD-10-CM | POA: Diagnosis not present

## 2018-03-15 DIAGNOSIS — J301 Allergic rhinitis due to pollen: Secondary | ICD-10-CM | POA: Diagnosis not present

## 2018-03-22 DIAGNOSIS — J3089 Other allergic rhinitis: Secondary | ICD-10-CM | POA: Diagnosis not present

## 2018-03-22 DIAGNOSIS — J3081 Allergic rhinitis due to animal (cat) (dog) hair and dander: Secondary | ICD-10-CM | POA: Diagnosis not present

## 2018-03-22 DIAGNOSIS — J301 Allergic rhinitis due to pollen: Secondary | ICD-10-CM | POA: Diagnosis not present

## 2018-03-29 DIAGNOSIS — J3089 Other allergic rhinitis: Secondary | ICD-10-CM | POA: Diagnosis not present

## 2018-03-29 DIAGNOSIS — J301 Allergic rhinitis due to pollen: Secondary | ICD-10-CM | POA: Diagnosis not present

## 2018-03-29 DIAGNOSIS — J3081 Allergic rhinitis due to animal (cat) (dog) hair and dander: Secondary | ICD-10-CM | POA: Diagnosis not present

## 2018-04-05 DIAGNOSIS — J301 Allergic rhinitis due to pollen: Secondary | ICD-10-CM | POA: Diagnosis not present

## 2018-04-05 DIAGNOSIS — J3081 Allergic rhinitis due to animal (cat) (dog) hair and dander: Secondary | ICD-10-CM | POA: Diagnosis not present

## 2018-04-05 DIAGNOSIS — J3089 Other allergic rhinitis: Secondary | ICD-10-CM | POA: Diagnosis not present

## 2018-04-06 DIAGNOSIS — C44311 Basal cell carcinoma of skin of nose: Secondary | ICD-10-CM | POA: Diagnosis not present

## 2018-04-08 DIAGNOSIS — M5136 Other intervertebral disc degeneration, lumbar region: Secondary | ICD-10-CM | POA: Diagnosis not present

## 2018-04-08 DIAGNOSIS — M62830 Muscle spasm of back: Secondary | ICD-10-CM | POA: Diagnosis not present

## 2018-04-08 DIAGNOSIS — M9903 Segmental and somatic dysfunction of lumbar region: Secondary | ICD-10-CM | POA: Diagnosis not present

## 2018-04-08 DIAGNOSIS — M545 Low back pain: Secondary | ICD-10-CM | POA: Diagnosis not present

## 2018-04-12 DIAGNOSIS — J3089 Other allergic rhinitis: Secondary | ICD-10-CM | POA: Diagnosis not present

## 2018-04-12 DIAGNOSIS — J301 Allergic rhinitis due to pollen: Secondary | ICD-10-CM | POA: Diagnosis not present

## 2018-04-12 DIAGNOSIS — J3081 Allergic rhinitis due to animal (cat) (dog) hair and dander: Secondary | ICD-10-CM | POA: Diagnosis not present

## 2018-04-19 DIAGNOSIS — J3089 Other allergic rhinitis: Secondary | ICD-10-CM | POA: Diagnosis not present

## 2018-04-19 DIAGNOSIS — J3081 Allergic rhinitis due to animal (cat) (dog) hair and dander: Secondary | ICD-10-CM | POA: Diagnosis not present

## 2018-04-19 DIAGNOSIS — J301 Allergic rhinitis due to pollen: Secondary | ICD-10-CM | POA: Diagnosis not present

## 2018-04-26 DIAGNOSIS — J301 Allergic rhinitis due to pollen: Secondary | ICD-10-CM | POA: Diagnosis not present

## 2018-04-26 DIAGNOSIS — J3081 Allergic rhinitis due to animal (cat) (dog) hair and dander: Secondary | ICD-10-CM | POA: Diagnosis not present

## 2018-04-26 DIAGNOSIS — J3089 Other allergic rhinitis: Secondary | ICD-10-CM | POA: Diagnosis not present

## 2018-05-03 DIAGNOSIS — J301 Allergic rhinitis due to pollen: Secondary | ICD-10-CM | POA: Diagnosis not present

## 2018-05-03 DIAGNOSIS — J3081 Allergic rhinitis due to animal (cat) (dog) hair and dander: Secondary | ICD-10-CM | POA: Diagnosis not present

## 2018-05-03 DIAGNOSIS — J3089 Other allergic rhinitis: Secondary | ICD-10-CM | POA: Diagnosis not present

## 2018-05-05 DIAGNOSIS — E039 Hypothyroidism, unspecified: Secondary | ICD-10-CM | POA: Diagnosis not present

## 2018-05-05 DIAGNOSIS — I2510 Atherosclerotic heart disease of native coronary artery without angina pectoris: Secondary | ICD-10-CM | POA: Diagnosis not present

## 2018-05-05 DIAGNOSIS — I10 Essential (primary) hypertension: Secondary | ICD-10-CM | POA: Diagnosis not present

## 2018-05-05 DIAGNOSIS — I679 Cerebrovascular disease, unspecified: Secondary | ICD-10-CM | POA: Diagnosis not present

## 2018-05-05 DIAGNOSIS — K219 Gastro-esophageal reflux disease without esophagitis: Secondary | ICD-10-CM | POA: Diagnosis not present

## 2018-05-05 DIAGNOSIS — E785 Hyperlipidemia, unspecified: Secondary | ICD-10-CM | POA: Diagnosis not present

## 2018-05-10 DIAGNOSIS — J301 Allergic rhinitis due to pollen: Secondary | ICD-10-CM | POA: Diagnosis not present

## 2018-05-10 DIAGNOSIS — J3081 Allergic rhinitis due to animal (cat) (dog) hair and dander: Secondary | ICD-10-CM | POA: Diagnosis not present

## 2018-05-10 DIAGNOSIS — J3089 Other allergic rhinitis: Secondary | ICD-10-CM | POA: Diagnosis not present

## 2018-05-13 DIAGNOSIS — M5136 Other intervertebral disc degeneration, lumbar region: Secondary | ICD-10-CM | POA: Diagnosis not present

## 2018-05-13 DIAGNOSIS — M62830 Muscle spasm of back: Secondary | ICD-10-CM | POA: Diagnosis not present

## 2018-05-13 DIAGNOSIS — M545 Low back pain: Secondary | ICD-10-CM | POA: Diagnosis not present

## 2018-05-13 DIAGNOSIS — M9903 Segmental and somatic dysfunction of lumbar region: Secondary | ICD-10-CM | POA: Diagnosis not present

## 2018-05-17 DIAGNOSIS — J3081 Allergic rhinitis due to animal (cat) (dog) hair and dander: Secondary | ICD-10-CM | POA: Diagnosis not present

## 2018-05-17 DIAGNOSIS — J301 Allergic rhinitis due to pollen: Secondary | ICD-10-CM | POA: Diagnosis not present

## 2018-05-17 DIAGNOSIS — J3089 Other allergic rhinitis: Secondary | ICD-10-CM | POA: Diagnosis not present

## 2018-05-24 DIAGNOSIS — J3081 Allergic rhinitis due to animal (cat) (dog) hair and dander: Secondary | ICD-10-CM | POA: Diagnosis not present

## 2018-05-24 DIAGNOSIS — J301 Allergic rhinitis due to pollen: Secondary | ICD-10-CM | POA: Diagnosis not present

## 2018-05-24 DIAGNOSIS — J3089 Other allergic rhinitis: Secondary | ICD-10-CM | POA: Diagnosis not present

## 2018-05-27 DIAGNOSIS — D229 Melanocytic nevi, unspecified: Secondary | ICD-10-CM | POA: Diagnosis not present

## 2018-05-27 DIAGNOSIS — L570 Actinic keratosis: Secondary | ICD-10-CM | POA: Diagnosis not present

## 2018-05-31 DIAGNOSIS — J3081 Allergic rhinitis due to animal (cat) (dog) hair and dander: Secondary | ICD-10-CM | POA: Diagnosis not present

## 2018-05-31 DIAGNOSIS — J3089 Other allergic rhinitis: Secondary | ICD-10-CM | POA: Diagnosis not present

## 2018-05-31 DIAGNOSIS — J301 Allergic rhinitis due to pollen: Secondary | ICD-10-CM | POA: Diagnosis not present

## 2018-06-07 DIAGNOSIS — J3081 Allergic rhinitis due to animal (cat) (dog) hair and dander: Secondary | ICD-10-CM | POA: Diagnosis not present

## 2018-06-07 DIAGNOSIS — J3089 Other allergic rhinitis: Secondary | ICD-10-CM | POA: Diagnosis not present

## 2018-06-07 DIAGNOSIS — J301 Allergic rhinitis due to pollen: Secondary | ICD-10-CM | POA: Diagnosis not present

## 2018-06-10 DIAGNOSIS — M9903 Segmental and somatic dysfunction of lumbar region: Secondary | ICD-10-CM | POA: Diagnosis not present

## 2018-06-10 DIAGNOSIS — M5136 Other intervertebral disc degeneration, lumbar region: Secondary | ICD-10-CM | POA: Diagnosis not present

## 2018-06-10 DIAGNOSIS — M62830 Muscle spasm of back: Secondary | ICD-10-CM | POA: Diagnosis not present

## 2018-06-10 DIAGNOSIS — M545 Low back pain: Secondary | ICD-10-CM | POA: Diagnosis not present

## 2018-06-14 DIAGNOSIS — J301 Allergic rhinitis due to pollen: Secondary | ICD-10-CM | POA: Diagnosis not present

## 2018-06-14 DIAGNOSIS — J3081 Allergic rhinitis due to animal (cat) (dog) hair and dander: Secondary | ICD-10-CM | POA: Diagnosis not present

## 2018-06-14 DIAGNOSIS — J3089 Other allergic rhinitis: Secondary | ICD-10-CM | POA: Diagnosis not present

## 2018-06-21 DIAGNOSIS — J3089 Other allergic rhinitis: Secondary | ICD-10-CM | POA: Diagnosis not present

## 2018-06-21 DIAGNOSIS — J3081 Allergic rhinitis due to animal (cat) (dog) hair and dander: Secondary | ICD-10-CM | POA: Diagnosis not present

## 2018-06-21 DIAGNOSIS — J301 Allergic rhinitis due to pollen: Secondary | ICD-10-CM | POA: Diagnosis not present

## 2018-06-28 DIAGNOSIS — J301 Allergic rhinitis due to pollen: Secondary | ICD-10-CM | POA: Diagnosis not present

## 2018-06-28 DIAGNOSIS — J3081 Allergic rhinitis due to animal (cat) (dog) hair and dander: Secondary | ICD-10-CM | POA: Diagnosis not present

## 2018-06-28 DIAGNOSIS — J3089 Other allergic rhinitis: Secondary | ICD-10-CM | POA: Diagnosis not present

## 2018-07-01 DIAGNOSIS — J301 Allergic rhinitis due to pollen: Secondary | ICD-10-CM | POA: Diagnosis not present

## 2018-07-01 DIAGNOSIS — J3081 Allergic rhinitis due to animal (cat) (dog) hair and dander: Secondary | ICD-10-CM | POA: Diagnosis not present

## 2018-07-01 DIAGNOSIS — J3089 Other allergic rhinitis: Secondary | ICD-10-CM | POA: Diagnosis not present

## 2018-07-05 DIAGNOSIS — J301 Allergic rhinitis due to pollen: Secondary | ICD-10-CM | POA: Diagnosis not present

## 2018-07-05 DIAGNOSIS — J3081 Allergic rhinitis due to animal (cat) (dog) hair and dander: Secondary | ICD-10-CM | POA: Diagnosis not present

## 2018-07-05 DIAGNOSIS — J3089 Other allergic rhinitis: Secondary | ICD-10-CM | POA: Diagnosis not present

## 2018-07-08 DIAGNOSIS — M9903 Segmental and somatic dysfunction of lumbar region: Secondary | ICD-10-CM | POA: Diagnosis not present

## 2018-07-08 DIAGNOSIS — M545 Low back pain: Secondary | ICD-10-CM | POA: Diagnosis not present

## 2018-07-08 DIAGNOSIS — M5136 Other intervertebral disc degeneration, lumbar region: Secondary | ICD-10-CM | POA: Diagnosis not present

## 2018-07-08 DIAGNOSIS — M62830 Muscle spasm of back: Secondary | ICD-10-CM | POA: Diagnosis not present

## 2018-07-08 DIAGNOSIS — J301 Allergic rhinitis due to pollen: Secondary | ICD-10-CM | POA: Diagnosis not present

## 2018-07-09 ENCOUNTER — Other Ambulatory Visit: Payer: Self-pay | Admitting: Cardiology

## 2018-07-09 DIAGNOSIS — R0989 Other specified symptoms and signs involving the circulatory and respiratory systems: Secondary | ICD-10-CM

## 2018-07-12 DIAGNOSIS — J301 Allergic rhinitis due to pollen: Secondary | ICD-10-CM | POA: Diagnosis not present

## 2018-07-12 DIAGNOSIS — J3081 Allergic rhinitis due to animal (cat) (dog) hair and dander: Secondary | ICD-10-CM | POA: Diagnosis not present

## 2018-07-12 DIAGNOSIS — J3089 Other allergic rhinitis: Secondary | ICD-10-CM | POA: Diagnosis not present

## 2018-07-19 DIAGNOSIS — J301 Allergic rhinitis due to pollen: Secondary | ICD-10-CM | POA: Diagnosis not present

## 2018-07-19 DIAGNOSIS — J3089 Other allergic rhinitis: Secondary | ICD-10-CM | POA: Diagnosis not present

## 2018-07-19 DIAGNOSIS — J3081 Allergic rhinitis due to animal (cat) (dog) hair and dander: Secondary | ICD-10-CM | POA: Diagnosis not present

## 2018-07-26 DIAGNOSIS — J3081 Allergic rhinitis due to animal (cat) (dog) hair and dander: Secondary | ICD-10-CM | POA: Diagnosis not present

## 2018-07-26 DIAGNOSIS — J3089 Other allergic rhinitis: Secondary | ICD-10-CM | POA: Diagnosis not present

## 2018-07-26 DIAGNOSIS — J301 Allergic rhinitis due to pollen: Secondary | ICD-10-CM | POA: Diagnosis not present

## 2018-08-02 ENCOUNTER — Other Ambulatory Visit: Payer: Self-pay | Admitting: Cardiology

## 2018-08-02 DIAGNOSIS — J301 Allergic rhinitis due to pollen: Secondary | ICD-10-CM | POA: Diagnosis not present

## 2018-08-02 DIAGNOSIS — J3081 Allergic rhinitis due to animal (cat) (dog) hair and dander: Secondary | ICD-10-CM | POA: Diagnosis not present

## 2018-08-02 DIAGNOSIS — J3089 Other allergic rhinitis: Secondary | ICD-10-CM | POA: Diagnosis not present

## 2018-08-02 MED ORDER — METOPROLOL SUCCINATE ER 25 MG PO TB24: 25.0000 mg | ORAL_TABLET | Freq: Every day | ORAL | 2 refills | Status: DC

## 2018-08-09 DIAGNOSIS — J3081 Allergic rhinitis due to animal (cat) (dog) hair and dander: Secondary | ICD-10-CM | POA: Diagnosis not present

## 2018-08-09 DIAGNOSIS — J301 Allergic rhinitis due to pollen: Secondary | ICD-10-CM | POA: Diagnosis not present

## 2018-08-09 DIAGNOSIS — J3089 Other allergic rhinitis: Secondary | ICD-10-CM | POA: Diagnosis not present

## 2018-08-09 IMAGING — US US ABDOMEN LIMITED
1 series · 14 of 25 positions shown · non-contrast
Comparison: None.

CLINICAL DATA: Right upper quadrant pain radiating to the back over
the last month.

EXAM:
ULTRASOUND ABDOMEN LIMITED RIGHT UPPER QUADRANT

[Series 1: us abdomen limited · 0.22mm/px · 14 of 44 slices shown]
[im 1/44]
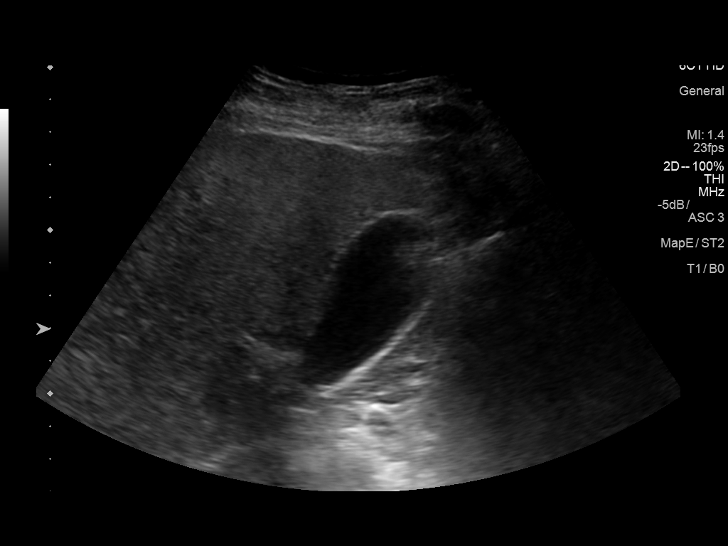
[im 4/44]
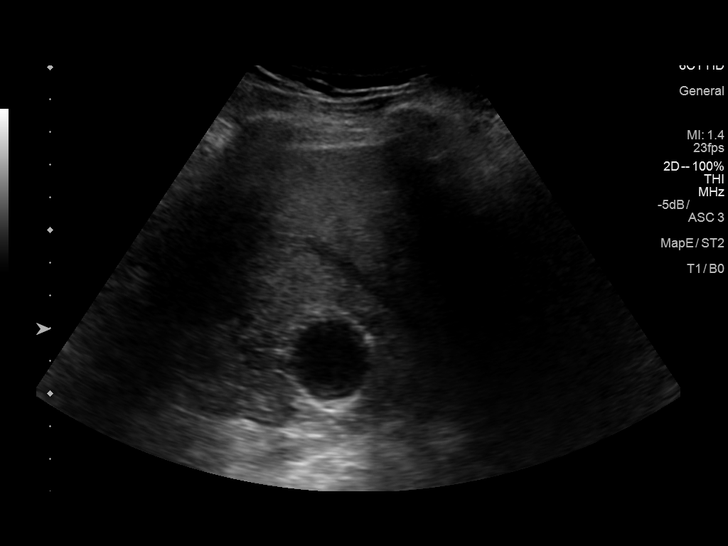
[im 8/44]
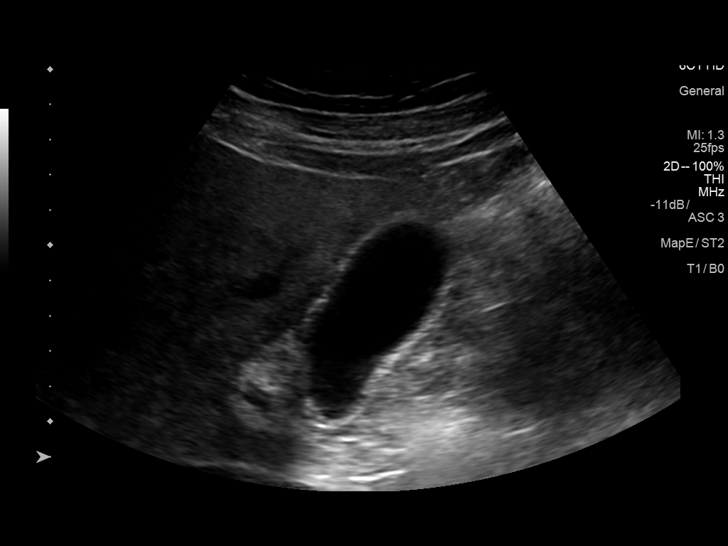
[im 11/44]
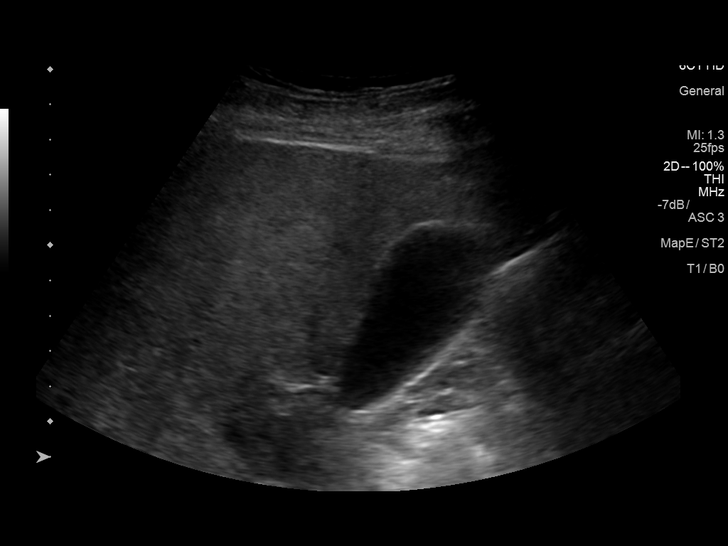
[im 15/44]
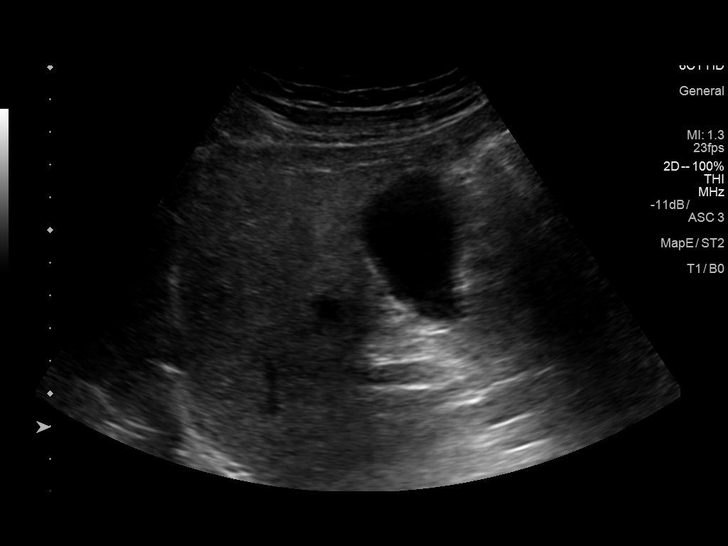
[im 17/44]
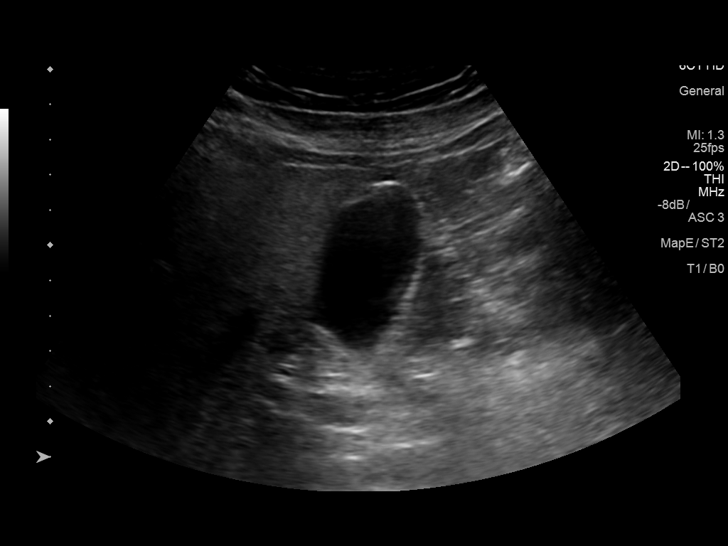
[im 20/44]
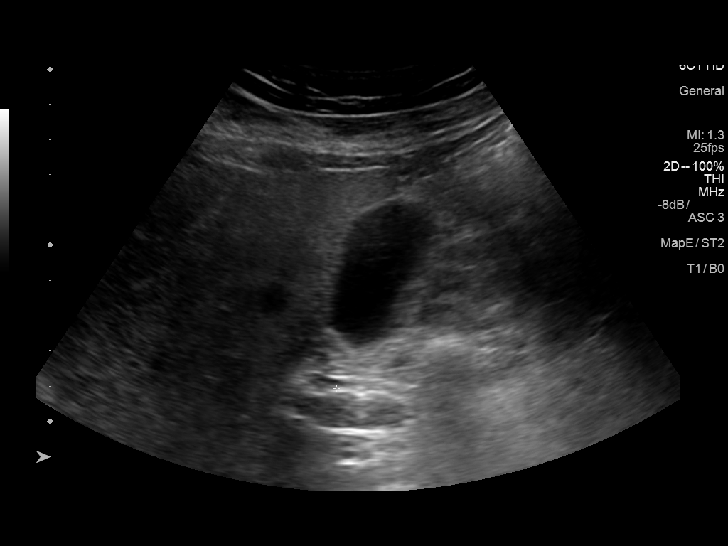
[im 24/44]
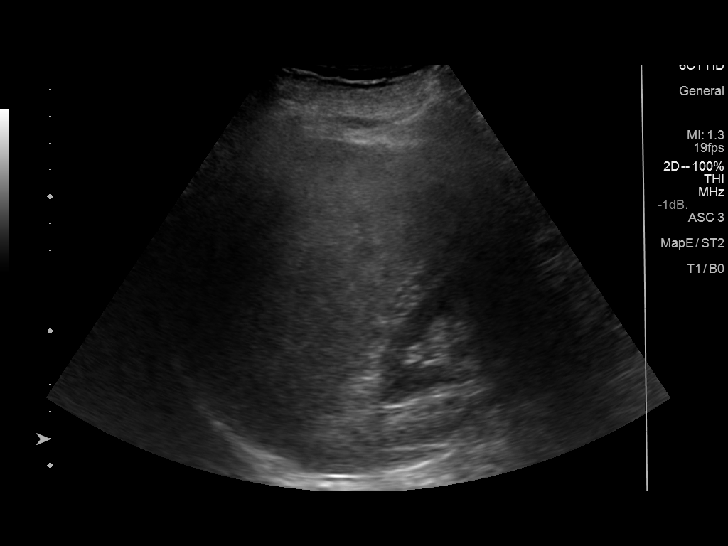
[im 27/44]
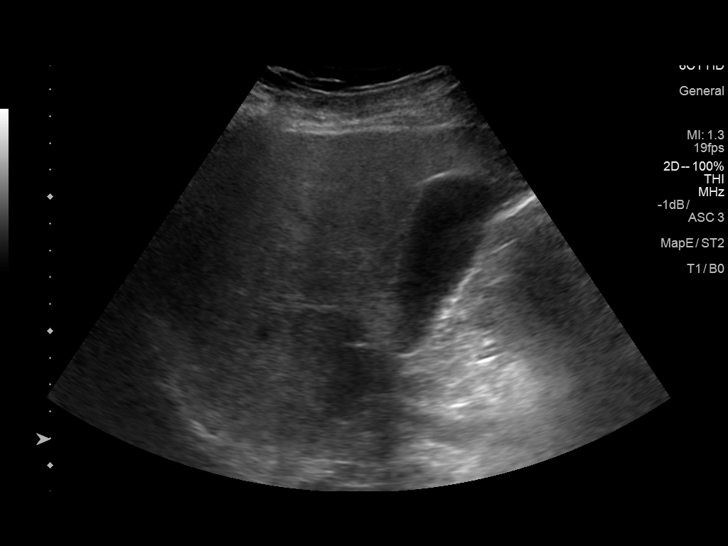
[im 29/44]
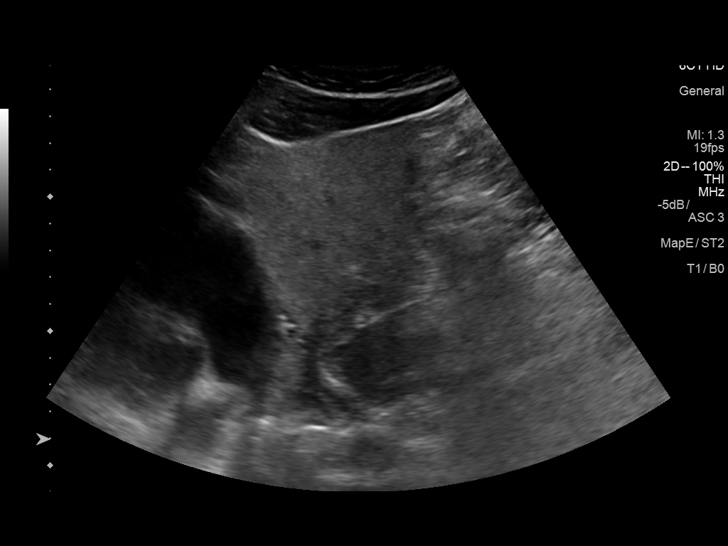
[im 33/44]
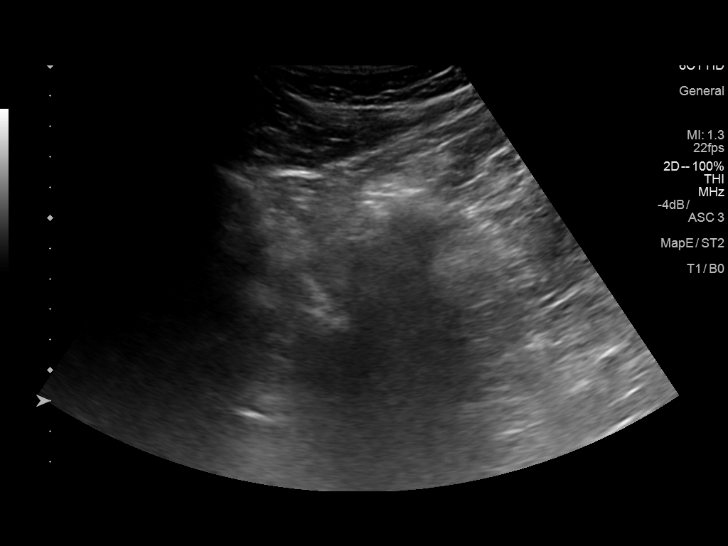
[im 36/44]
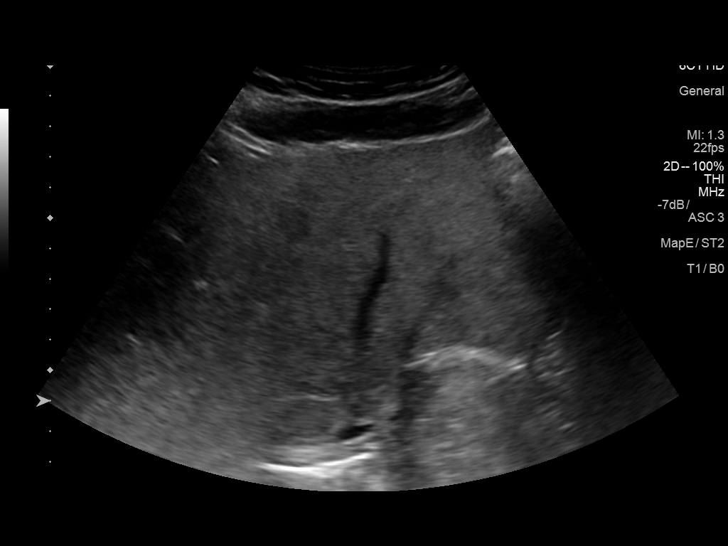
[im 40/44]
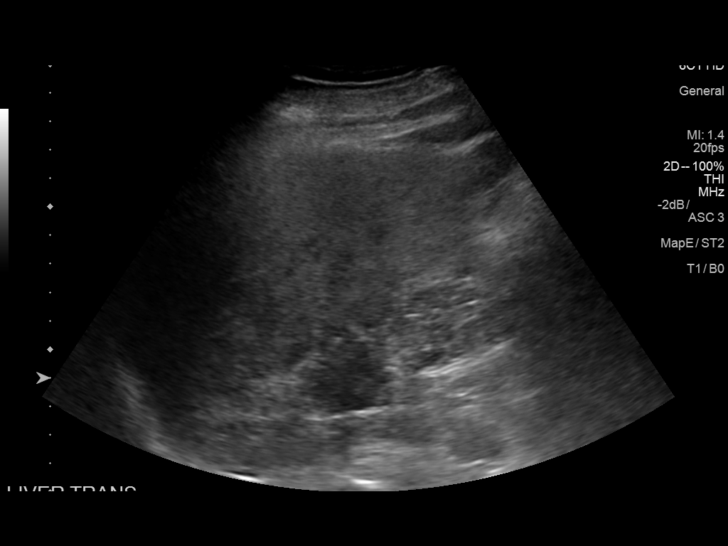
[im 44/44]
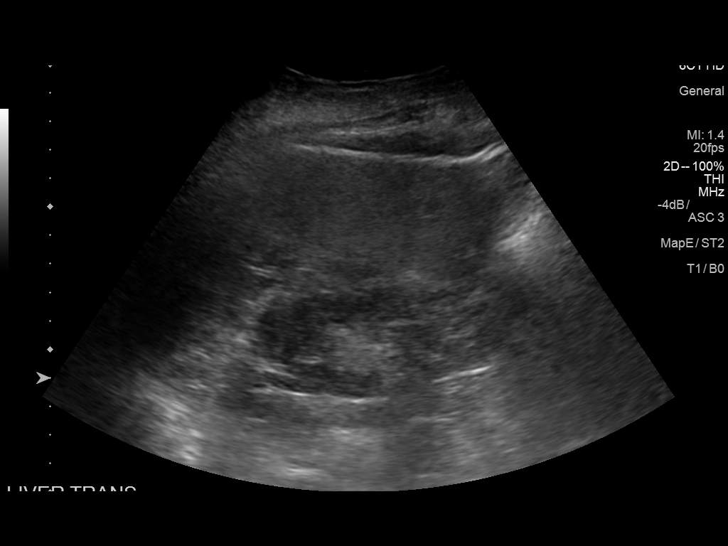

[14 of 25 positions shown; findings below may reference images not displayed]

FINDINGS: Gallbladder:

No gallstones or wall thickening visualized. No sonographic Murphy
sign noted by sonographer.

Common bile duct:

Diameter: 2.6 mm common normal

Liver:

Slightly echogenic liver parenchyma suggesting fatty change. No
focal lesion or ductal dilatation. Portal vein is patent on color
Doppler imaging with normal direction of blood flow towards the
liver.
IMPRESSION: No evidence of gallbladder pathology. The liver parenchyma shows
increased echogenicity suggesting steatosis. No focal lesion or
ductal dilatation is seen.

## 2018-08-17 DIAGNOSIS — J301 Allergic rhinitis due to pollen: Secondary | ICD-10-CM | POA: Diagnosis not present

## 2018-08-17 DIAGNOSIS — J3089 Other allergic rhinitis: Secondary | ICD-10-CM | POA: Diagnosis not present

## 2018-08-17 DIAGNOSIS — J3081 Allergic rhinitis due to animal (cat) (dog) hair and dander: Secondary | ICD-10-CM | POA: Diagnosis not present

## 2018-08-19 DIAGNOSIS — L570 Actinic keratosis: Secondary | ICD-10-CM | POA: Diagnosis not present

## 2018-08-19 DIAGNOSIS — L821 Other seborrheic keratosis: Secondary | ICD-10-CM | POA: Diagnosis not present

## 2018-08-19 DIAGNOSIS — D229 Melanocytic nevi, unspecified: Secondary | ICD-10-CM | POA: Diagnosis not present

## 2018-08-23 DIAGNOSIS — J3081 Allergic rhinitis due to animal (cat) (dog) hair and dander: Secondary | ICD-10-CM | POA: Diagnosis not present

## 2018-08-23 DIAGNOSIS — J3089 Other allergic rhinitis: Secondary | ICD-10-CM | POA: Diagnosis not present

## 2018-08-23 DIAGNOSIS — J301 Allergic rhinitis due to pollen: Secondary | ICD-10-CM | POA: Diagnosis not present

## 2018-08-30 DIAGNOSIS — J3089 Other allergic rhinitis: Secondary | ICD-10-CM | POA: Diagnosis not present

## 2018-08-30 DIAGNOSIS — J3081 Allergic rhinitis due to animal (cat) (dog) hair and dander: Secondary | ICD-10-CM | POA: Diagnosis not present

## 2018-08-30 DIAGNOSIS — J301 Allergic rhinitis due to pollen: Secondary | ICD-10-CM | POA: Diagnosis not present

## 2018-09-08 DIAGNOSIS — J3081 Allergic rhinitis due to animal (cat) (dog) hair and dander: Secondary | ICD-10-CM | POA: Diagnosis not present

## 2018-09-08 DIAGNOSIS — J301 Allergic rhinitis due to pollen: Secondary | ICD-10-CM | POA: Diagnosis not present

## 2018-09-08 DIAGNOSIS — J3089 Other allergic rhinitis: Secondary | ICD-10-CM | POA: Diagnosis not present

## 2018-09-08 DIAGNOSIS — H1045 Other chronic allergic conjunctivitis: Secondary | ICD-10-CM | POA: Diagnosis not present

## 2018-09-13 DIAGNOSIS — J3089 Other allergic rhinitis: Secondary | ICD-10-CM | POA: Diagnosis not present

## 2018-09-13 DIAGNOSIS — J301 Allergic rhinitis due to pollen: Secondary | ICD-10-CM | POA: Diagnosis not present

## 2018-09-13 DIAGNOSIS — J3081 Allergic rhinitis due to animal (cat) (dog) hair and dander: Secondary | ICD-10-CM | POA: Diagnosis not present

## 2018-09-20 DIAGNOSIS — J3089 Other allergic rhinitis: Secondary | ICD-10-CM | POA: Diagnosis not present

## 2018-09-20 DIAGNOSIS — J301 Allergic rhinitis due to pollen: Secondary | ICD-10-CM | POA: Diagnosis not present

## 2018-09-20 DIAGNOSIS — J3081 Allergic rhinitis due to animal (cat) (dog) hair and dander: Secondary | ICD-10-CM | POA: Diagnosis not present

## 2018-09-28 DIAGNOSIS — J3089 Other allergic rhinitis: Secondary | ICD-10-CM | POA: Diagnosis not present

## 2018-09-28 DIAGNOSIS — J301 Allergic rhinitis due to pollen: Secondary | ICD-10-CM | POA: Diagnosis not present

## 2018-09-28 DIAGNOSIS — J3081 Allergic rhinitis due to animal (cat) (dog) hair and dander: Secondary | ICD-10-CM | POA: Diagnosis not present

## 2018-10-05 ENCOUNTER — Other Ambulatory Visit: Payer: Medicare Other

## 2018-10-05 DIAGNOSIS — J301 Allergic rhinitis due to pollen: Secondary | ICD-10-CM | POA: Diagnosis not present

## 2018-10-05 DIAGNOSIS — J3089 Other allergic rhinitis: Secondary | ICD-10-CM | POA: Diagnosis not present

## 2018-10-05 DIAGNOSIS — J3081 Allergic rhinitis due to animal (cat) (dog) hair and dander: Secondary | ICD-10-CM | POA: Diagnosis not present

## 2018-10-11 DIAGNOSIS — J301 Allergic rhinitis due to pollen: Secondary | ICD-10-CM | POA: Diagnosis not present

## 2018-10-11 DIAGNOSIS — J3089 Other allergic rhinitis: Secondary | ICD-10-CM | POA: Diagnosis not present

## 2018-10-11 DIAGNOSIS — J3081 Allergic rhinitis due to animal (cat) (dog) hair and dander: Secondary | ICD-10-CM | POA: Diagnosis not present

## 2018-10-19 DIAGNOSIS — J3089 Other allergic rhinitis: Secondary | ICD-10-CM | POA: Diagnosis not present

## 2018-10-19 DIAGNOSIS — Z23 Encounter for immunization: Secondary | ICD-10-CM | POA: Diagnosis not present

## 2018-10-19 DIAGNOSIS — J301 Allergic rhinitis due to pollen: Secondary | ICD-10-CM | POA: Diagnosis not present

## 2018-10-19 DIAGNOSIS — J3081 Allergic rhinitis due to animal (cat) (dog) hair and dander: Secondary | ICD-10-CM | POA: Diagnosis not present

## 2018-10-21 DIAGNOSIS — J3081 Allergic rhinitis due to animal (cat) (dog) hair and dander: Secondary | ICD-10-CM | POA: Diagnosis not present

## 2018-10-26 DIAGNOSIS — Z23 Encounter for immunization: Secondary | ICD-10-CM | POA: Diagnosis not present

## 2018-10-26 DIAGNOSIS — J301 Allergic rhinitis due to pollen: Secondary | ICD-10-CM | POA: Diagnosis not present

## 2018-10-26 DIAGNOSIS — J3089 Other allergic rhinitis: Secondary | ICD-10-CM | POA: Diagnosis not present

## 2018-10-26 DIAGNOSIS — J3081 Allergic rhinitis due to animal (cat) (dog) hair and dander: Secondary | ICD-10-CM | POA: Diagnosis not present

## 2018-10-29 DIAGNOSIS — J3089 Other allergic rhinitis: Secondary | ICD-10-CM | POA: Diagnosis not present

## 2018-10-29 DIAGNOSIS — J301 Allergic rhinitis due to pollen: Secondary | ICD-10-CM | POA: Diagnosis not present

## 2018-10-29 DIAGNOSIS — J3081 Allergic rhinitis due to animal (cat) (dog) hair and dander: Secondary | ICD-10-CM | POA: Diagnosis not present

## 2018-11-02 DIAGNOSIS — J3089 Other allergic rhinitis: Secondary | ICD-10-CM | POA: Diagnosis not present

## 2018-11-02 DIAGNOSIS — J3081 Allergic rhinitis due to animal (cat) (dog) hair and dander: Secondary | ICD-10-CM | POA: Diagnosis not present

## 2018-11-02 DIAGNOSIS — J301 Allergic rhinitis due to pollen: Secondary | ICD-10-CM | POA: Diagnosis not present

## 2018-11-05 DIAGNOSIS — E039 Hypothyroidism, unspecified: Secondary | ICD-10-CM | POA: Diagnosis not present

## 2018-11-05 DIAGNOSIS — M545 Low back pain: Secondary | ICD-10-CM | POA: Diagnosis not present

## 2018-11-05 DIAGNOSIS — I679 Cerebrovascular disease, unspecified: Secondary | ICD-10-CM | POA: Diagnosis not present

## 2018-11-05 DIAGNOSIS — E785 Hyperlipidemia, unspecified: Secondary | ICD-10-CM | POA: Diagnosis not present

## 2018-11-05 DIAGNOSIS — Z125 Encounter for screening for malignant neoplasm of prostate: Secondary | ICD-10-CM | POA: Diagnosis not present

## 2018-11-05 DIAGNOSIS — G629 Polyneuropathy, unspecified: Secondary | ICD-10-CM | POA: Diagnosis not present

## 2018-11-05 DIAGNOSIS — I2510 Atherosclerotic heart disease of native coronary artery without angina pectoris: Secondary | ICD-10-CM | POA: Diagnosis not present

## 2018-11-05 DIAGNOSIS — Z0000 Encounter for general adult medical examination without abnormal findings: Secondary | ICD-10-CM | POA: Diagnosis not present

## 2018-11-05 DIAGNOSIS — K219 Gastro-esophageal reflux disease without esophagitis: Secondary | ICD-10-CM | POA: Diagnosis not present

## 2018-11-05 DIAGNOSIS — I10 Essential (primary) hypertension: Secondary | ICD-10-CM | POA: Diagnosis not present

## 2018-11-08 ENCOUNTER — Other Ambulatory Visit: Payer: Self-pay

## 2018-11-08 ENCOUNTER — Ambulatory Visit (INDEPENDENT_AMBULATORY_CARE_PROVIDER_SITE_OTHER): Payer: Medicare Other

## 2018-11-08 DIAGNOSIS — R0989 Other specified symptoms and signs involving the circulatory and respiratory systems: Secondary | ICD-10-CM | POA: Diagnosis not present

## 2018-11-09 ENCOUNTER — Other Ambulatory Visit: Payer: Self-pay | Admitting: Cardiology

## 2018-11-09 DIAGNOSIS — I6523 Occlusion and stenosis of bilateral carotid arteries: Secondary | ICD-10-CM

## 2018-11-10 DIAGNOSIS — M5416 Radiculopathy, lumbar region: Secondary | ICD-10-CM | POA: Diagnosis not present

## 2018-11-10 DIAGNOSIS — J3089 Other allergic rhinitis: Secondary | ICD-10-CM | POA: Diagnosis not present

## 2018-11-10 DIAGNOSIS — J3081 Allergic rhinitis due to animal (cat) (dog) hair and dander: Secondary | ICD-10-CM | POA: Diagnosis not present

## 2018-11-10 DIAGNOSIS — J301 Allergic rhinitis due to pollen: Secondary | ICD-10-CM | POA: Diagnosis not present

## 2018-11-16 NOTE — Progress Notes (Signed)
Left vm

## 2018-11-17 DIAGNOSIS — J3089 Other allergic rhinitis: Secondary | ICD-10-CM | POA: Diagnosis not present

## 2018-11-17 DIAGNOSIS — J301 Allergic rhinitis due to pollen: Secondary | ICD-10-CM | POA: Diagnosis not present

## 2018-11-17 DIAGNOSIS — M5416 Radiculopathy, lumbar region: Secondary | ICD-10-CM | POA: Diagnosis not present

## 2018-11-17 DIAGNOSIS — J3081 Allergic rhinitis due to animal (cat) (dog) hair and dander: Secondary | ICD-10-CM | POA: Diagnosis not present

## 2018-11-18 DIAGNOSIS — L570 Actinic keratosis: Secondary | ICD-10-CM | POA: Diagnosis not present

## 2018-11-18 DIAGNOSIS — R7309 Other abnormal glucose: Secondary | ICD-10-CM | POA: Diagnosis not present

## 2018-11-22 DIAGNOSIS — J3089 Other allergic rhinitis: Secondary | ICD-10-CM | POA: Diagnosis not present

## 2018-11-22 DIAGNOSIS — J3081 Allergic rhinitis due to animal (cat) (dog) hair and dander: Secondary | ICD-10-CM | POA: Diagnosis not present

## 2018-11-22 DIAGNOSIS — J301 Allergic rhinitis due to pollen: Secondary | ICD-10-CM | POA: Diagnosis not present

## 2018-11-24 DIAGNOSIS — M5416 Radiculopathy, lumbar region: Secondary | ICD-10-CM | POA: Diagnosis not present

## 2018-11-26 DIAGNOSIS — M545 Low back pain: Secondary | ICD-10-CM | POA: Diagnosis not present

## 2018-12-01 DIAGNOSIS — M5416 Radiculopathy, lumbar region: Secondary | ICD-10-CM | POA: Diagnosis not present

## 2018-12-01 DIAGNOSIS — J3081 Allergic rhinitis due to animal (cat) (dog) hair and dander: Secondary | ICD-10-CM | POA: Diagnosis not present

## 2018-12-01 DIAGNOSIS — J301 Allergic rhinitis due to pollen: Secondary | ICD-10-CM | POA: Diagnosis not present

## 2018-12-01 DIAGNOSIS — J3089 Other allergic rhinitis: Secondary | ICD-10-CM | POA: Diagnosis not present

## 2018-12-03 DIAGNOSIS — J3089 Other allergic rhinitis: Secondary | ICD-10-CM | POA: Diagnosis not present

## 2018-12-03 DIAGNOSIS — J301 Allergic rhinitis due to pollen: Secondary | ICD-10-CM | POA: Diagnosis not present

## 2018-12-03 DIAGNOSIS — M5416 Radiculopathy, lumbar region: Secondary | ICD-10-CM | POA: Diagnosis not present

## 2018-12-03 DIAGNOSIS — J3081 Allergic rhinitis due to animal (cat) (dog) hair and dander: Secondary | ICD-10-CM | POA: Diagnosis not present

## 2018-12-06 DIAGNOSIS — M5416 Radiculopathy, lumbar region: Secondary | ICD-10-CM | POA: Diagnosis not present

## 2018-12-06 DIAGNOSIS — J3089 Other allergic rhinitis: Secondary | ICD-10-CM | POA: Diagnosis not present

## 2018-12-06 DIAGNOSIS — J3081 Allergic rhinitis due to animal (cat) (dog) hair and dander: Secondary | ICD-10-CM | POA: Diagnosis not present

## 2018-12-06 DIAGNOSIS — J301 Allergic rhinitis due to pollen: Secondary | ICD-10-CM | POA: Diagnosis not present

## 2018-12-09 DIAGNOSIS — J3081 Allergic rhinitis due to animal (cat) (dog) hair and dander: Secondary | ICD-10-CM | POA: Diagnosis not present

## 2018-12-09 DIAGNOSIS — J3089 Other allergic rhinitis: Secondary | ICD-10-CM | POA: Diagnosis not present

## 2018-12-09 DIAGNOSIS — J301 Allergic rhinitis due to pollen: Secondary | ICD-10-CM | POA: Diagnosis not present

## 2018-12-10 DIAGNOSIS — M5416 Radiculopathy, lumbar region: Secondary | ICD-10-CM | POA: Diagnosis not present

## 2018-12-13 DIAGNOSIS — J3081 Allergic rhinitis due to animal (cat) (dog) hair and dander: Secondary | ICD-10-CM | POA: Diagnosis not present

## 2018-12-13 DIAGNOSIS — J3089 Other allergic rhinitis: Secondary | ICD-10-CM | POA: Diagnosis not present

## 2018-12-13 DIAGNOSIS — J301 Allergic rhinitis due to pollen: Secondary | ICD-10-CM | POA: Diagnosis not present

## 2018-12-17 DIAGNOSIS — J301 Allergic rhinitis due to pollen: Secondary | ICD-10-CM | POA: Diagnosis not present

## 2018-12-21 DIAGNOSIS — J3089 Other allergic rhinitis: Secondary | ICD-10-CM | POA: Diagnosis not present

## 2018-12-21 DIAGNOSIS — J3081 Allergic rhinitis due to animal (cat) (dog) hair and dander: Secondary | ICD-10-CM | POA: Diagnosis not present

## 2018-12-21 DIAGNOSIS — J301 Allergic rhinitis due to pollen: Secondary | ICD-10-CM | POA: Diagnosis not present

## 2018-12-24 DIAGNOSIS — J301 Allergic rhinitis due to pollen: Secondary | ICD-10-CM | POA: Diagnosis not present

## 2018-12-27 DIAGNOSIS — J3089 Other allergic rhinitis: Secondary | ICD-10-CM | POA: Diagnosis not present

## 2018-12-27 DIAGNOSIS — J3081 Allergic rhinitis due to animal (cat) (dog) hair and dander: Secondary | ICD-10-CM | POA: Diagnosis not present

## 2018-12-27 DIAGNOSIS — J301 Allergic rhinitis due to pollen: Secondary | ICD-10-CM | POA: Diagnosis not present

## 2018-12-31 DIAGNOSIS — J301 Allergic rhinitis due to pollen: Secondary | ICD-10-CM | POA: Diagnosis not present

## 2019-01-03 DIAGNOSIS — J3089 Other allergic rhinitis: Secondary | ICD-10-CM | POA: Diagnosis not present

## 2019-01-03 DIAGNOSIS — J3081 Allergic rhinitis due to animal (cat) (dog) hair and dander: Secondary | ICD-10-CM | POA: Diagnosis not present

## 2019-01-03 DIAGNOSIS — J301 Allergic rhinitis due to pollen: Secondary | ICD-10-CM | POA: Diagnosis not present

## 2019-01-07 DIAGNOSIS — J3081 Allergic rhinitis due to animal (cat) (dog) hair and dander: Secondary | ICD-10-CM | POA: Diagnosis not present

## 2019-01-07 DIAGNOSIS — J3089 Other allergic rhinitis: Secondary | ICD-10-CM | POA: Diagnosis not present

## 2019-01-07 DIAGNOSIS — J301 Allergic rhinitis due to pollen: Secondary | ICD-10-CM | POA: Diagnosis not present

## 2019-01-10 DIAGNOSIS — J3081 Allergic rhinitis due to animal (cat) (dog) hair and dander: Secondary | ICD-10-CM | POA: Diagnosis not present

## 2019-01-10 DIAGNOSIS — J301 Allergic rhinitis due to pollen: Secondary | ICD-10-CM | POA: Diagnosis not present

## 2019-01-10 DIAGNOSIS — J3089 Other allergic rhinitis: Secondary | ICD-10-CM | POA: Diagnosis not present

## 2019-01-17 DIAGNOSIS — J3081 Allergic rhinitis due to animal (cat) (dog) hair and dander: Secondary | ICD-10-CM | POA: Diagnosis not present

## 2019-01-17 DIAGNOSIS — J301 Allergic rhinitis due to pollen: Secondary | ICD-10-CM | POA: Diagnosis not present

## 2019-01-17 DIAGNOSIS — J3089 Other allergic rhinitis: Secondary | ICD-10-CM | POA: Diagnosis not present

## 2019-01-21 DIAGNOSIS — D485 Neoplasm of uncertain behavior of skin: Secondary | ICD-10-CM | POA: Diagnosis not present

## 2019-01-21 DIAGNOSIS — L570 Actinic keratosis: Secondary | ICD-10-CM | POA: Diagnosis not present

## 2019-01-24 DIAGNOSIS — J301 Allergic rhinitis due to pollen: Secondary | ICD-10-CM | POA: Diagnosis not present

## 2019-01-24 DIAGNOSIS — J3089 Other allergic rhinitis: Secondary | ICD-10-CM | POA: Diagnosis not present

## 2019-01-24 DIAGNOSIS — J3081 Allergic rhinitis due to animal (cat) (dog) hair and dander: Secondary | ICD-10-CM | POA: Diagnosis not present

## 2019-01-31 DIAGNOSIS — J301 Allergic rhinitis due to pollen: Secondary | ICD-10-CM | POA: Diagnosis not present

## 2019-01-31 DIAGNOSIS — J3089 Other allergic rhinitis: Secondary | ICD-10-CM | POA: Diagnosis not present

## 2019-01-31 DIAGNOSIS — J3081 Allergic rhinitis due to animal (cat) (dog) hair and dander: Secondary | ICD-10-CM | POA: Diagnosis not present

## 2019-02-07 DIAGNOSIS — J301 Allergic rhinitis due to pollen: Secondary | ICD-10-CM | POA: Diagnosis not present

## 2019-02-07 DIAGNOSIS — J3081 Allergic rhinitis due to animal (cat) (dog) hair and dander: Secondary | ICD-10-CM | POA: Diagnosis not present

## 2019-02-07 DIAGNOSIS — J3089 Other allergic rhinitis: Secondary | ICD-10-CM | POA: Diagnosis not present

## 2019-02-14 DIAGNOSIS — J301 Allergic rhinitis due to pollen: Secondary | ICD-10-CM | POA: Diagnosis not present

## 2019-02-14 DIAGNOSIS — J3081 Allergic rhinitis due to animal (cat) (dog) hair and dander: Secondary | ICD-10-CM | POA: Diagnosis not present

## 2019-02-14 DIAGNOSIS — R7309 Other abnormal glucose: Secondary | ICD-10-CM | POA: Diagnosis not present

## 2019-02-14 DIAGNOSIS — J3089 Other allergic rhinitis: Secondary | ICD-10-CM | POA: Diagnosis not present

## 2019-02-21 DIAGNOSIS — J3089 Other allergic rhinitis: Secondary | ICD-10-CM | POA: Diagnosis not present

## 2019-02-21 DIAGNOSIS — J301 Allergic rhinitis due to pollen: Secondary | ICD-10-CM | POA: Diagnosis not present

## 2019-02-21 DIAGNOSIS — J3081 Allergic rhinitis due to animal (cat) (dog) hair and dander: Secondary | ICD-10-CM | POA: Diagnosis not present

## 2019-02-28 DIAGNOSIS — J301 Allergic rhinitis due to pollen: Secondary | ICD-10-CM | POA: Diagnosis not present

## 2019-02-28 DIAGNOSIS — J3081 Allergic rhinitis due to animal (cat) (dog) hair and dander: Secondary | ICD-10-CM | POA: Diagnosis not present

## 2019-02-28 DIAGNOSIS — J3089 Other allergic rhinitis: Secondary | ICD-10-CM | POA: Diagnosis not present

## 2019-03-09 ENCOUNTER — Ambulatory Visit (INDEPENDENT_AMBULATORY_CARE_PROVIDER_SITE_OTHER): Payer: Medicare Other | Admitting: Cardiology

## 2019-03-09 ENCOUNTER — Encounter: Payer: Self-pay | Admitting: Cardiology

## 2019-03-09 ENCOUNTER — Other Ambulatory Visit: Payer: Self-pay

## 2019-03-09 VITALS — BP 133/70 | HR 58 | Temp 97.6°F | Ht 66.0 in | Wt 190.8 lb

## 2019-03-09 DIAGNOSIS — E7800 Pure hypercholesterolemia, unspecified: Secondary | ICD-10-CM | POA: Diagnosis not present

## 2019-03-09 DIAGNOSIS — I10 Essential (primary) hypertension: Secondary | ICD-10-CM

## 2019-03-09 DIAGNOSIS — I2510 Atherosclerotic heart disease of native coronary artery without angina pectoris: Secondary | ICD-10-CM | POA: Diagnosis not present

## 2019-03-09 DIAGNOSIS — I6523 Occlusion and stenosis of bilateral carotid arteries: Secondary | ICD-10-CM

## 2019-03-09 MED ORDER — NITROGLYCERIN 0.4 MG SL SUBL: 0.4000 mg | SUBLINGUAL_TABLET | SUBLINGUAL | 3 refills | Status: DC | PRN

## 2019-03-09 NOTE — Progress Notes (Signed)
Primary Physician/Referring:  Aura Dials, MD  Patient ID: Daniel Quinn, male    DOB: Feb 07, 1946, 73 y.o.   MRN: [id number]  Chief Complaint  Patient presents with  . Coronary Artery Disease  . Carotid   HPI:    Daniel Quinn  is a 73 y.o. 73 year old Caucasian male with history of stroke in 2016 for high-grade left vertebral artery stenosis, multivessel coronary artery disease with angioplasty to his right coronary artery on 02/05/2016 followed by staged intervention to LAD and proximal and mid circumflex coronary artery on 02/26/2016. This is annual visit.  He had not used any sublingual nitroglycerin. Denies chest pain, shortness of breath, symptoms of claudication, dizziness or neurologic deficit.  Past Medical History:  Diagnosis Date  . Anxiety   . BCC (basal cell carcinoma of skin) 04/01/2014   bcc ulc. right forehead tx exc  . BCC (basal cell carcinoma of skin) 04/26/2015   bcc + marg reck 67mo . BCC (basal cell carcinoma of skin) 06/09/2016   bcc sup nod right mid back tx cx3 54f . BCC (basal cell carcinoma of skin) 01/14/2018   bcc nod. left nose tx MOHS  . Coronary artery disease   . CVA (cerebral infarction) 07/01/14  . Depression   . Difficulty swallowing   . Environmental allergies   . GERD (gastroesophageal reflux disease)   . Hyperlipidemia   . Hypertension   . Hypothyroid   . Macrocytosis   . Polyarthritis    chronic  . Restless leg   . 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 SCC (squamous cell carcinoma) 01/14/2018   scc in situ left arm tx cx3 [id number]. [facility]   "right leg drags when I get tired; very little feeling of hot/cold on my RUE/RLE now" (02/05/2016)  . Swelling    right arm/hand, legs "since my stroke in 2015" (02/05/2016)   Past Surgical History:  Procedure Laterality Date  . CARDIAC CATHETERIZATION N/A 02/05/2016   Procedure: Left Heart Cath and Coronary Angiography;   Surgeon: JayAdrian ProwsD;  Location: [facility];  Service: Cardiovascular;  Laterality: N/A;  . CARDIAC CATHETERIZATION N/A 02/05/2016   Procedure: Coronary Stent Intervention;  Surgeon: JayAdrian ProwsD;  Location: [facility];  Service: Cardiovascular;  Laterality: N/A;  . CARDIAC CATHETERIZATION N/A 02/26/2016   Procedure: Coronary Stent Intervention;  Surgeon: JayAdrian ProwsD;  Location: [facility];  Service: Cardiovascular;  Laterality: N/A;  . CORONARY STENT PLACEMENT  02/26/2016   Successful atherectomy with CSI diamondback catheter followed by stenting with 3.0 x 18 mm onyx in the proximal LAD, stenosis reduced from 90% to 0%.  . NMarland KitchenSAL SEPTUM SURGERY  2000s   Social History   Socioeconomic History  . Marital status: Widowed    Spouse name: Not on file  . Number of children: 2  . Years of education: 16 52 Highest education level: Not on file  Occupational History  . 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 Smoking status: Never Smoker  . Smokeless tobacco: Never Used  Substance and Sexual Activity  . Alcohol use: Yes    Alcohol/week: 3.0 standard drinks    Types: 3 Cans of beer per week  . Drug use: No  . Sexual activity: Not Currently  Other Topics Concern  . Not on file  Social History Narrative  Widowed, 2 children 1 grandchild   Right handed   Caffeine use - 2 cups coffee daily, 1 soda daily   Social Determinants of Health   Financial Resource Strain:   . Difficulty of Paying Living Expenses: Not on file  Food Insecurity:   . Worried About Charity fundraiser in the Last Year: Not on file  . Ran Out of Food in the Last Year: Not on file  Transportation Needs:   . Lack of Transportation (Medical): Not on file  . Lack of Transportation (Non-Medical): Not on file  Physical Activity:   . Days of Exercise per Week: Not on file  . Minutes of Exercise per Session: Not on file  Stress:   . Feeling of Stress : Not on file    Social Connections:   . Frequency of Communication with Friends and Family: Not on file  . Frequency of Social Gatherings with Friends and Family: Not on file  . Attends Religious Services: Not on file  . Active Member of Clubs or Organizations: Not on file  . Attends Archivist Meetings: Not on file  . Marital Status: Not on file  Intimate Partner Violence:   . Fear of Current or Ex-Partner: Not on file  . Emotionally Abused: Not on file  . Physically Abused: Not on file  . Sexually Abused: Not on file   ROS  Review of Systems  Constitution: Negative for chills, decreased appetite, malaise/fatigue and weight gain.  Cardiovascular: Negative for dyspnea on exertion, leg swelling and syncope.  Endocrine: Negative for cold intolerance.  Hematologic/Lymphatic: Does not bruise/bleed easily.  Musculoskeletal: Negative for joint swelling.  Gastrointestinal: Negative for abdominal pain, anorexia, change in bowel habit, hematochezia and melena.  Neurological: Negative for headaches and light-headedness.  Psychiatric/Behavioral: Negative for depression and substance abuse.  All other systems reviewed and are negative.  Objective  Blood pressure 133/70, pulse (!) 58, temperature 97.6 F (36.4 C), height '5\' 6"'  (1.676 m), weight 190 lb 12.8 oz (86.5 kg), SpO2 98 %.  Vitals with BMI 03/09/2019 02/27/2016 02/27/2016  Height '5\' 6"'  - -  Weight 190 lbs 13 oz - 183 lbs  BMI 55.20 - -  Systolic 802 233 612  Diastolic 70 60 71  Pulse 58 63 61     Physical Exam  Constitutional: He appears well-developed and well-nourished.  HENT:  Head: Atraumatic.  Eyes: Conjunctivae are normal.  Neck: No JVD present. No thyromegaly present.  Cardiovascular: Normal rate, regular rhythm, normal heart sounds and intact distal pulses. Exam reveals no gallop.  No murmur heard. No leg edema, no JVD.  Pulmonary/Chest: Effort normal and breath sounds normal.  Abdominal: Soft. Bowel sounds are normal.   Musculoskeletal:        General: Normal range of motion.     Cervical back: Neck supple.  Neurological: He is alert.  Skin: Skin is warm and dry.  Psychiatric: He has a normal mood and affect.   Laboratory examination:   No results for input(s): NA, K, CL, CO2, GLUCOSE, BUN, CREATININE, CALCIUM, GFRNONAA, GFRAA in the last 8760 hours. CrCl cannot be calculated (Patient's most recent lab result is older than the maximum 21 days allowed.).  CMP Latest Ref Rng & Units 02/27/2016 02/06/2016 07/03/2014  Glucose 65 - 99 mg/dL 107(H) 122(H) 92  BUN 6 - 20 mg/dL 23(H) 16 18  Creatinine 0.61 - 1.24 mg/dL 1.26(H) 1.15 0.90  Sodium 135 - 145 mmol/L 138 138 135  Potassium 3.5 - 5.1  mmol/L 4.1 3.6 3.8  Chloride 101 - 111 mmol/L 106 104 102  CO2 22 - 32 mmol/L '24 24 30  ' Calcium 8.9 - 10.3 mg/dL 8.6(L) 8.9 8.4  Total Protein 6.0 - 8.3 g/dL - - -  Total Bilirubin 0.3 - 1.2 mg/dL - - -  Alkaline Phos 39 - 117 U/L - - -  AST 0 - 37 U/L - - -  ALT 0 - 53 U/L - - -   CBC Latest Ref Rng & Units 02/27/2016 02/06/2016 07/01/2014  WBC 4.0 - 10.5 K/uL 8.2 11.0(H) -  Hemoglobin 13.0 - 17.0 g/dL 11.8(L) 13.2 16.0  Hematocrit 39.0 - 52.0 % 34.8(L) 37.5(L) 47.0  Platelets 150 - 400 K/uL 194 214 -   Lipid Panel     Component Value Date/Time   CHOL 210 (H) 07/02/2014 0658   TRIG 160 (H) 07/02/2014 0658   HDL 44 07/02/2014 0658   CHOLHDL 4.8 07/02/2014 0658   VLDL 32 07/02/2014 0658   LDLCALC 134 (H) 07/02/2014 0658   HEMOGLOBIN A1C Lab Results  Component Value Date   HGBA1C 6.0 (H) 07/02/2014   MPG 126 07/02/2014   TSH No results for input(s): TSH in the last 8760 hours.   05/05/2017: Creatinine 1.23, EGFR 58/71, potassium 5.1, CMP normal. TSH 3.4. Free T4 normal. Cholesterol 150, triglycerides 148, HDL 39, LDL 81.  Medications and allergies   Allergies  Allergen Reactions  . Brilinta [Ticagrelor]     Fever, elevated blood pressure, trembling     Current Outpatient Medications  Medication  Instructions  . acetaminophen (TYLENOL) 650 mg, Oral, Every 6 hours PRN  . amLODipine (NORVASC) 10 mg, Oral, Daily  . aspirin 81 mg, Oral, Daily  . atorvastatin (LIPITOR) 40 mg, Oral, Daily-1800  . azelastine (ASTELIN) 0.1 % nasal spray 2 sprays, Each Nare, 2 times daily, Use in each nostril as directed   . FLUoxetine (PROZAC) 20 mg, Oral, Daily  . fluticasone (FLONASE) 50 MCG/ACT nasal spray 1 spray, Each Nare, Daily  . isosorbide mononitrate (IMDUR) 60 mg, Oral, Daily  . levocetirizine (XYZAL) 5 mg, Oral, Daily  . levothyroxine (SYNTHROID) 112 mcg, Oral, Daily before breakfast  . lisinopril (ZESTRIL) 20 mg, Oral, Daily  . metoprolol succinate (TOPROL-XL) 25 mg, Oral, Daily  . Multiple Vitamin (MULTIVITAMIN WITH MINERALS) TABS tablet 1 tablet, Oral, Daily  . nitroGLYCERIN (NITROSTAT) 0.4 mg, Sublingual, Every 5 min PRN  . pantoprazole (PROTONIX) 20 mg, Oral, 2 times daily  . PROVENTIL HFA 108 (90 BASE) MCG/ACT inhaler 1-2 puffs, Inhalation, Every 4 hours PRN  . vitamin C 1,000 mg, Oral, Daily    Radiology:  No results found.  Cardiac Studies:   Echocardiogram 01/31/2016: Left ventricle cavity is normal in size. Borderline decrease in global wall motion. Visual EF is 45-50%. Left atrial cavity is mildly dilated at 4.3 cm. Mild to moderate mitral regurgitation. Mild tricuspid regurgitation. No evidence of pulmonary hypertension.  Left Heart Catheterization 02/05/2016: 1. Normal LV systolic function, EF 11%. 2. High-grade proximal RCA, distal RCA 95% stenosis, S/P 3.0 x 18 mm and a 2.5 x 15 mm Xience Alpine DES, stenosis reduced to 0%. 3. Calcific LAD and Cx stenosis scheduled for staged PCI.  Left Heart Catheterization 12/0/2017: Successful atherectomy with CSI diamondback catheter followed by stenting 3.0 x 18 mm onyx in the proximal LAD.  Stenting of the proximal circumflex and mid circumflex coronary artery with overlapping 3.0 x 12 mm onyx and 3.0 x 15 mm Xience Alpine  DES.  Carotid artery duplex  11/08/2018: Stenosis in the right internal carotid artery (16-49%). Stenosis in the right external carotid artery (<50%). Stenosis in the left internal carotid artery (16-49%). Antegrade right vertebral artery flow. Antegrade left vertebral artery flow. Compared to the study done on 09/29/2017, no significant change. Follow up in one year is appropriate if clinically indicated.  Assessment     ICD-10-CM   1. Asymptomatic bilateral carotid artery stenosis  I65.23 EKG 12-Lead  2. Coronary artery disease involving native coronary artery of native heart without angina pectoris  I25.10 nitroGLYCERIN (NITROSTAT) 0.4 MG SL tablet  3. Hypercholesteremia  E78.00   4. Primary hypertension  I10     EKG 10/60/2020: Normal sinus rhythm/sinus bradycardia at the rate of 55 bpm, normal axis.  No evidence of ischemia, normal EKG.   Recommendations:   Meds ordered this encounter  Medications  . nitroGLYCERIN (NITROSTAT) 0.4 MG SL tablet    Sig: Place 1 tablet (0.4 mg total) under the tongue every 5 (five) minutes as needed for chest pain.    Dispense:  25 tablet    Refill:  3    Patient is here on a annual follow-up, fortunately he has not had any further episodes of angina pectoris since angioplasty of all 3 coronary vessels in 2017.  Advised him that he can certainly discontinue Plavix and continue aspirin. His blood pressure is well controlled. With regard to hyperlipidemia, being managed per PCP.  He would also be a good candidate for "heritage" trial with LPA evaluation and also "CV Mobius trial" observation trial in patients with stroke and CAD.  I'll see him back in September 2020 with repeat carotid artery duplex, which is been scheduled for surveillance.  Carotid stenosis appears stable.  Adrian Prows, MD, [facility] 03/09/2019, 10:00 PM [facility] [place] Pager: [phone] Office: [phone]

## 2019-04-04 DIAGNOSIS — J301 Allergic rhinitis due to pollen: Secondary | ICD-10-CM | POA: Diagnosis not present

## 2019-04-04 DIAGNOSIS — J3089 Other allergic rhinitis: Secondary | ICD-10-CM | POA: Diagnosis not present

## 2019-04-04 DIAGNOSIS — J3081 Allergic rhinitis due to animal (cat) (dog) hair and dander: Secondary | ICD-10-CM | POA: Diagnosis not present

## 2019-04-11 DIAGNOSIS — J3089 Other allergic rhinitis: Secondary | ICD-10-CM | POA: Diagnosis not present

## 2019-04-11 DIAGNOSIS — J301 Allergic rhinitis due to pollen: Secondary | ICD-10-CM | POA: Diagnosis not present

## 2019-04-11 DIAGNOSIS — J3081 Allergic rhinitis due to animal (cat) (dog) hair and dander: Secondary | ICD-10-CM | POA: Diagnosis not present

## 2019-04-18 DIAGNOSIS — J301 Allergic rhinitis due to pollen: Secondary | ICD-10-CM | POA: Diagnosis not present

## 2019-04-18 DIAGNOSIS — J3081 Allergic rhinitis due to animal (cat) (dog) hair and dander: Secondary | ICD-10-CM | POA: Diagnosis not present

## 2019-04-18 DIAGNOSIS — J3089 Other allergic rhinitis: Secondary | ICD-10-CM | POA: Diagnosis not present

## 2019-04-21 ENCOUNTER — Other Ambulatory Visit: Payer: Self-pay | Admitting: Cardiology

## 2019-04-21 DIAGNOSIS — J3089 Other allergic rhinitis: Secondary | ICD-10-CM | POA: Diagnosis not present

## 2019-04-21 DIAGNOSIS — J3081 Allergic rhinitis due to animal (cat) (dog) hair and dander: Secondary | ICD-10-CM | POA: Diagnosis not present

## 2019-04-21 DIAGNOSIS — Z23 Encounter for immunization: Secondary | ICD-10-CM | POA: Diagnosis not present

## 2019-04-22 DIAGNOSIS — L570 Actinic keratosis: Secondary | ICD-10-CM | POA: Diagnosis not present

## 2019-04-25 DIAGNOSIS — J3089 Other allergic rhinitis: Secondary | ICD-10-CM | POA: Diagnosis not present

## 2019-04-25 DIAGNOSIS — J3081 Allergic rhinitis due to animal (cat) (dog) hair and dander: Secondary | ICD-10-CM | POA: Diagnosis not present

## 2019-04-25 DIAGNOSIS — J301 Allergic rhinitis due to pollen: Secondary | ICD-10-CM | POA: Diagnosis not present

## 2019-04-28 DIAGNOSIS — J3089 Other allergic rhinitis: Secondary | ICD-10-CM | POA: Diagnosis not present

## 2019-04-28 DIAGNOSIS — J3081 Allergic rhinitis due to animal (cat) (dog) hair and dander: Secondary | ICD-10-CM | POA: Diagnosis not present

## 2019-05-02 DIAGNOSIS — J301 Allergic rhinitis due to pollen: Secondary | ICD-10-CM | POA: Diagnosis not present

## 2019-05-02 DIAGNOSIS — J3089 Other allergic rhinitis: Secondary | ICD-10-CM | POA: Diagnosis not present

## 2019-05-02 DIAGNOSIS — J3081 Allergic rhinitis due to animal (cat) (dog) hair and dander: Secondary | ICD-10-CM | POA: Diagnosis not present

## 2019-05-10 DIAGNOSIS — J301 Allergic rhinitis due to pollen: Secondary | ICD-10-CM | POA: Diagnosis not present

## 2019-05-10 DIAGNOSIS — J3089 Other allergic rhinitis: Secondary | ICD-10-CM | POA: Diagnosis not present

## 2019-05-10 DIAGNOSIS — J3081 Allergic rhinitis due to animal (cat) (dog) hair and dander: Secondary | ICD-10-CM | POA: Diagnosis not present

## 2019-05-12 ENCOUNTER — Ambulatory Visit: Payer: Medicare Other | Admitting: Cardiology

## 2019-05-16 DIAGNOSIS — J301 Allergic rhinitis due to pollen: Secondary | ICD-10-CM | POA: Diagnosis not present

## 2019-05-16 DIAGNOSIS — J3081 Allergic rhinitis due to animal (cat) (dog) hair and dander: Secondary | ICD-10-CM | POA: Diagnosis not present

## 2019-05-16 DIAGNOSIS — J3089 Other allergic rhinitis: Secondary | ICD-10-CM | POA: Diagnosis not present

## 2019-05-18 DIAGNOSIS — R7309 Other abnormal glucose: Secondary | ICD-10-CM | POA: Diagnosis not present

## 2019-05-19 ENCOUNTER — Ambulatory Visit: Payer: Medicare Other | Admitting: Cardiology

## 2019-05-23 DIAGNOSIS — J3089 Other allergic rhinitis: Secondary | ICD-10-CM | POA: Diagnosis not present

## 2019-05-23 DIAGNOSIS — J3081 Allergic rhinitis due to animal (cat) (dog) hair and dander: Secondary | ICD-10-CM | POA: Diagnosis not present

## 2019-05-23 DIAGNOSIS — J301 Allergic rhinitis due to pollen: Secondary | ICD-10-CM | POA: Diagnosis not present

## 2019-05-31 DIAGNOSIS — J3081 Allergic rhinitis due to animal (cat) (dog) hair and dander: Secondary | ICD-10-CM | POA: Diagnosis not present

## 2019-05-31 DIAGNOSIS — J3089 Other allergic rhinitis: Secondary | ICD-10-CM | POA: Diagnosis not present

## 2019-05-31 DIAGNOSIS — J301 Allergic rhinitis due to pollen: Secondary | ICD-10-CM | POA: Diagnosis not present

## 2019-06-06 DIAGNOSIS — J3081 Allergic rhinitis due to animal (cat) (dog) hair and dander: Secondary | ICD-10-CM | POA: Diagnosis not present

## 2019-06-06 DIAGNOSIS — J301 Allergic rhinitis due to pollen: Secondary | ICD-10-CM | POA: Diagnosis not present

## 2019-06-06 DIAGNOSIS — J3089 Other allergic rhinitis: Secondary | ICD-10-CM | POA: Diagnosis not present

## 2019-06-08 DIAGNOSIS — R7309 Other abnormal glucose: Secondary | ICD-10-CM | POA: Diagnosis not present

## 2019-06-09 DIAGNOSIS — J301 Allergic rhinitis due to pollen: Secondary | ICD-10-CM | POA: Diagnosis not present

## 2019-06-13 DIAGNOSIS — J301 Allergic rhinitis due to pollen: Secondary | ICD-10-CM | POA: Diagnosis not present

## 2019-06-13 DIAGNOSIS — J3081 Allergic rhinitis due to animal (cat) (dog) hair and dander: Secondary | ICD-10-CM | POA: Diagnosis not present

## 2019-06-13 DIAGNOSIS — J3089 Other allergic rhinitis: Secondary | ICD-10-CM | POA: Diagnosis not present

## 2019-06-20 DIAGNOSIS — J3081 Allergic rhinitis due to animal (cat) (dog) hair and dander: Secondary | ICD-10-CM | POA: Diagnosis not present

## 2019-06-20 DIAGNOSIS — J301 Allergic rhinitis due to pollen: Secondary | ICD-10-CM | POA: Diagnosis not present

## 2019-06-20 DIAGNOSIS — J3089 Other allergic rhinitis: Secondary | ICD-10-CM | POA: Diagnosis not present

## 2019-06-27 DIAGNOSIS — J3089 Other allergic rhinitis: Secondary | ICD-10-CM | POA: Diagnosis not present

## 2019-06-27 DIAGNOSIS — J3081 Allergic rhinitis due to animal (cat) (dog) hair and dander: Secondary | ICD-10-CM | POA: Diagnosis not present

## 2019-06-27 DIAGNOSIS — J301 Allergic rhinitis due to pollen: Secondary | ICD-10-CM | POA: Diagnosis not present

## 2019-07-04 DIAGNOSIS — J3089 Other allergic rhinitis: Secondary | ICD-10-CM | POA: Diagnosis not present

## 2019-07-04 DIAGNOSIS — J3081 Allergic rhinitis due to animal (cat) (dog) hair and dander: Secondary | ICD-10-CM | POA: Diagnosis not present

## 2019-07-04 DIAGNOSIS — J301 Allergic rhinitis due to pollen: Secondary | ICD-10-CM | POA: Diagnosis not present

## 2019-07-11 DIAGNOSIS — J3089 Other allergic rhinitis: Secondary | ICD-10-CM | POA: Diagnosis not present

## 2019-07-11 DIAGNOSIS — J301 Allergic rhinitis due to pollen: Secondary | ICD-10-CM | POA: Diagnosis not present

## 2019-07-11 DIAGNOSIS — J3081 Allergic rhinitis due to animal (cat) (dog) hair and dander: Secondary | ICD-10-CM | POA: Diagnosis not present

## 2019-07-16 DIAGNOSIS — C4492 Squamous cell carcinoma of skin, unspecified: Secondary | ICD-10-CM

## 2019-07-16 HISTORY — DX: Squamous cell carcinoma of skin, unspecified: C44.92

## 2019-07-18 ENCOUNTER — Ambulatory Visit (INDEPENDENT_AMBULATORY_CARE_PROVIDER_SITE_OTHER): Payer: Medicare Other | Admitting: Physician Assistant

## 2019-07-18 ENCOUNTER — Encounter: Payer: Self-pay | Admitting: Physician Assistant

## 2019-07-18 ENCOUNTER — Encounter: Payer: Self-pay | Admitting: *Deleted

## 2019-07-18 ENCOUNTER — Other Ambulatory Visit: Payer: Self-pay

## 2019-07-18 DIAGNOSIS — Z85828 Personal history of other malignant neoplasm of skin: Secondary | ICD-10-CM | POA: Diagnosis not present

## 2019-07-18 DIAGNOSIS — D0461 Carcinoma in situ of skin of right upper limb, including shoulder: Secondary | ICD-10-CM | POA: Diagnosis not present

## 2019-07-18 DIAGNOSIS — L570 Actinic keratosis: Secondary | ICD-10-CM | POA: Diagnosis not present

## 2019-07-18 DIAGNOSIS — Z86007 Personal history of in-situ neoplasm of skin: Secondary | ICD-10-CM

## 2019-07-18 DIAGNOSIS — J3081 Allergic rhinitis due to animal (cat) (dog) hair and dander: Secondary | ICD-10-CM | POA: Diagnosis not present

## 2019-07-18 DIAGNOSIS — D485 Neoplasm of uncertain behavior of skin: Secondary | ICD-10-CM

## 2019-07-18 DIAGNOSIS — J301 Allergic rhinitis due to pollen: Secondary | ICD-10-CM | POA: Diagnosis not present

## 2019-07-18 DIAGNOSIS — J3089 Other allergic rhinitis: Secondary | ICD-10-CM | POA: Diagnosis not present

## 2019-07-18 DIAGNOSIS — R7309 Other abnormal glucose: Secondary | ICD-10-CM | POA: Diagnosis not present

## 2019-07-18 NOTE — Progress Notes (Addendum)
   Follow up Visit  Subjective  Daniel Quinn is a 74 y.o. male who presents for the following: Follow-up (3 month - Ln2 last ov- arms, ears- No new concerns). History of BCC x 4, CIS x 2. He did do the [place] on his face, right ear,  and left forearm. No major irritation. Did have some peeling in the areas where he used it.    Objective  Well appearing patient in no apparent distress; mood and affect are within normal limits.  All skin waist up examined. No suspicious moles noted on back.   Objective  Right Forearm - Posterior: Crusted erythematous papule.     Objective  Left Temple (5), Right Ear (2), Right Elbow - Posterior (3), Right Temple (2): Erythematous patches with gritty scale.  Assessment & Plan  Neoplasm of uncertain behavior of skin Right Forearm - Posterior  Skin / nail biopsy Type of biopsy: tangential   Informed consent: discussed and consent obtained   Procedure prep:  Patient was prepped and draped in usual sterile fashion (Non sterile) Prep type:  Chlorhexidine Anesthesia: the lesion was anesthetized in a standard fashion   Anesthetic:  1% lidocaine w/ epinephrine 1-100,000 local infiltration Instrument used: flexible razor blade    Specimen 1 - Surgical pathology Differential Diagnosis: bcc vs scc Check Margins: No  AK (actinic keratosis) (12) Right Ear (2); Right Elbow - Posterior (3); Left Temple (5); Right Temple (2)  Destruction of lesion - Left Temple, Right Ear, Right Elbow - Posterior, Right Temple Complexity: simple   Destruction method: cryotherapy   Informed consent: discussed and consent obtained   Timeout:  patient name, date of birth, surgical site, and procedure verified Lesion destroyed using liquid nitrogen: Yes   Outcome: patient tolerated procedure well with no complications

## 2019-07-18 NOTE — Patient Instructions (Signed)

## 2019-07-21 ENCOUNTER — Telehealth: Payer: Self-pay

## 2019-07-21 NOTE — Telephone Encounter (Signed)
Phone call to patient with his Pathology results and [name]'s recommendations.  Patient aware of pathology results and appointment scheduled on 05/20/20201 @ 11:30am.

## 2019-07-21 NOTE — Telephone Encounter (Signed)
-----   Message from Arlyss Gandy, [place] sent at 07/21/2019 10:40 AM EDT ----- SCC in situ right forearm. 30 min with me.

## 2019-07-25 DIAGNOSIS — J3089 Other allergic rhinitis: Secondary | ICD-10-CM | POA: Diagnosis not present

## 2019-07-25 DIAGNOSIS — J3081 Allergic rhinitis due to animal (cat) (dog) hair and dander: Secondary | ICD-10-CM | POA: Diagnosis not present

## 2019-07-25 DIAGNOSIS — J301 Allergic rhinitis due to pollen: Secondary | ICD-10-CM | POA: Diagnosis not present

## 2019-08-01 DIAGNOSIS — J3089 Other allergic rhinitis: Secondary | ICD-10-CM | POA: Diagnosis not present

## 2019-08-01 DIAGNOSIS — J3081 Allergic rhinitis due to animal (cat) (dog) hair and dander: Secondary | ICD-10-CM | POA: Diagnosis not present

## 2019-08-01 DIAGNOSIS — J301 Allergic rhinitis due to pollen: Secondary | ICD-10-CM | POA: Diagnosis not present

## 2019-08-08 DIAGNOSIS — J3089 Other allergic rhinitis: Secondary | ICD-10-CM | POA: Diagnosis not present

## 2019-08-08 DIAGNOSIS — J3081 Allergic rhinitis due to animal (cat) (dog) hair and dander: Secondary | ICD-10-CM | POA: Diagnosis not present

## 2019-08-08 DIAGNOSIS — J301 Allergic rhinitis due to pollen: Secondary | ICD-10-CM | POA: Diagnosis not present

## 2019-08-11 ENCOUNTER — Encounter: Payer: 59 | Admitting: Physician Assistant

## 2019-08-15 DIAGNOSIS — J3081 Allergic rhinitis due to animal (cat) (dog) hair and dander: Secondary | ICD-10-CM | POA: Diagnosis not present

## 2019-08-15 DIAGNOSIS — J301 Allergic rhinitis due to pollen: Secondary | ICD-10-CM | POA: Diagnosis not present

## 2019-08-15 DIAGNOSIS — J3089 Other allergic rhinitis: Secondary | ICD-10-CM | POA: Diagnosis not present

## 2019-08-16 DIAGNOSIS — N289 Disorder of kidney and ureter, unspecified: Secondary | ICD-10-CM | POA: Diagnosis not present

## 2019-08-17 ENCOUNTER — Other Ambulatory Visit: Payer: Self-pay | Admitting: Family Medicine

## 2019-08-17 DIAGNOSIS — N289 Disorder of kidney and ureter, unspecified: Secondary | ICD-10-CM

## 2019-08-24 DIAGNOSIS — J3081 Allergic rhinitis due to animal (cat) (dog) hair and dander: Secondary | ICD-10-CM | POA: Diagnosis not present

## 2019-08-24 DIAGNOSIS — J3089 Other allergic rhinitis: Secondary | ICD-10-CM | POA: Diagnosis not present

## 2019-08-24 DIAGNOSIS — J301 Allergic rhinitis due to pollen: Secondary | ICD-10-CM | POA: Diagnosis not present

## 2019-08-29 ENCOUNTER — Ambulatory Visit
Admission: RE | Admit: 2019-08-29 | Discharge: 2019-08-29 | Disposition: A | Discharge status: Home / Self Care | Payer: Medicare Other | Source: Ambulatory Visit | Attending: Family Medicine | Admitting: Family Medicine

## 2019-08-29 DIAGNOSIS — R944 Abnormal results of kidney function studies: Secondary | ICD-10-CM | POA: Diagnosis not present

## 2019-08-29 DIAGNOSIS — N289 Disorder of kidney and ureter, unspecified: Secondary | ICD-10-CM

## 2019-08-30 ENCOUNTER — Encounter: Payer: Self-pay | Admitting: *Deleted

## 2019-08-31 ENCOUNTER — Other Ambulatory Visit: Payer: Self-pay

## 2019-08-31 ENCOUNTER — Encounter: Payer: Self-pay | Admitting: Physician Assistant

## 2019-08-31 ENCOUNTER — Ambulatory Visit (INDEPENDENT_AMBULATORY_CARE_PROVIDER_SITE_OTHER): Payer: Medicare Other | Admitting: Physician Assistant

## 2019-08-31 DIAGNOSIS — L570 Actinic keratosis: Secondary | ICD-10-CM | POA: Diagnosis not present

## 2019-08-31 DIAGNOSIS — J3089 Other allergic rhinitis: Secondary | ICD-10-CM | POA: Diagnosis not present

## 2019-08-31 DIAGNOSIS — D0461 Carcinoma in situ of skin of right upper limb, including shoulder: Secondary | ICD-10-CM

## 2019-08-31 DIAGNOSIS — J301 Allergic rhinitis due to pollen: Secondary | ICD-10-CM | POA: Diagnosis not present

## 2019-08-31 DIAGNOSIS — J3081 Allergic rhinitis due to animal (cat) (dog) hair and dander: Secondary | ICD-10-CM | POA: Diagnosis not present

## 2019-08-31 NOTE — Progress Notes (Addendum)
   Follow up Visit  Subjective  Kamarri Lovvorn is a 74 y.o. male who presents for the following: Procedure (Here for treatment of cis right forearm). It has healed well and is just pink. He has had 2 other SCC it situs In the past.   Objective  Well appearing patient in no apparent distress; mood and affect are within normal limits.  A focused examination was performed including Face and arms.. Relevant physical exam findings are noted in the Assessment and Plan.   Objective  Right Zygomatic Area: Erythematous patches with gritty scale.  Objective  Right Forearm - Posterior: Biopsy scar identified.   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 We will recheck this area in 3 months but he is to call if this area develops a scale or a bump.

## 2019-10-11 DIAGNOSIS — J3081 Allergic rhinitis due to animal (cat) (dog) hair and dander: Secondary | ICD-10-CM | POA: Diagnosis not present

## 2019-10-11 DIAGNOSIS — J3089 Other allergic rhinitis: Secondary | ICD-10-CM | POA: Diagnosis not present

## 2019-10-11 DIAGNOSIS — J301 Allergic rhinitis due to pollen: Secondary | ICD-10-CM | POA: Diagnosis not present

## 2019-10-17 ENCOUNTER — Ambulatory Visit: Payer: 59 | Admitting: Physician Assistant

## 2019-10-18 DIAGNOSIS — J3089 Other allergic rhinitis: Secondary | ICD-10-CM | POA: Diagnosis not present

## 2019-10-18 DIAGNOSIS — J3081 Allergic rhinitis due to animal (cat) (dog) hair and dander: Secondary | ICD-10-CM | POA: Diagnosis not present

## 2019-10-18 DIAGNOSIS — J301 Allergic rhinitis due to pollen: Secondary | ICD-10-CM | POA: Diagnosis not present

## 2019-10-24 DIAGNOSIS — J3089 Other allergic rhinitis: Secondary | ICD-10-CM | POA: Diagnosis not present

## 2019-10-24 DIAGNOSIS — J301 Allergic rhinitis due to pollen: Secondary | ICD-10-CM | POA: Diagnosis not present

## 2019-10-24 DIAGNOSIS — J3081 Allergic rhinitis due to animal (cat) (dog) hair and dander: Secondary | ICD-10-CM | POA: Diagnosis not present

## 2019-10-27 DIAGNOSIS — J3081 Allergic rhinitis due to animal (cat) (dog) hair and dander: Secondary | ICD-10-CM | POA: Diagnosis not present

## 2019-10-27 DIAGNOSIS — J301 Allergic rhinitis due to pollen: Secondary | ICD-10-CM | POA: Diagnosis not present

## 2019-10-31 DIAGNOSIS — J3081 Allergic rhinitis due to animal (cat) (dog) hair and dander: Secondary | ICD-10-CM | POA: Diagnosis not present

## 2019-10-31 DIAGNOSIS — J301 Allergic rhinitis due to pollen: Secondary | ICD-10-CM | POA: Diagnosis not present

## 2019-10-31 DIAGNOSIS — J3089 Other allergic rhinitis: Secondary | ICD-10-CM | POA: Diagnosis not present

## 2019-11-07 DIAGNOSIS — J3089 Other allergic rhinitis: Secondary | ICD-10-CM | POA: Diagnosis not present

## 2019-11-07 DIAGNOSIS — J301 Allergic rhinitis due to pollen: Secondary | ICD-10-CM | POA: Diagnosis not present

## 2019-11-07 DIAGNOSIS — J3081 Allergic rhinitis due to animal (cat) (dog) hair and dander: Secondary | ICD-10-CM | POA: Diagnosis not present

## 2019-11-09 ENCOUNTER — Ambulatory Visit: Payer: Medicare Other

## 2019-11-09 ENCOUNTER — Other Ambulatory Visit: Payer: Self-pay

## 2019-11-09 DIAGNOSIS — I6523 Occlusion and stenosis of bilateral carotid arteries: Secondary | ICD-10-CM | POA: Diagnosis not present

## 2019-11-11 DIAGNOSIS — I2510 Atherosclerotic heart disease of native coronary artery without angina pectoris: Secondary | ICD-10-CM | POA: Diagnosis not present

## 2019-11-11 DIAGNOSIS — E785 Hyperlipidemia, unspecified: Secondary | ICD-10-CM | POA: Diagnosis not present

## 2019-11-11 DIAGNOSIS — Z125 Encounter for screening for malignant neoplasm of prostate: Secondary | ICD-10-CM | POA: Diagnosis not present

## 2019-11-11 DIAGNOSIS — K219 Gastro-esophageal reflux disease without esophagitis: Secondary | ICD-10-CM | POA: Diagnosis not present

## 2019-11-11 DIAGNOSIS — I10 Essential (primary) hypertension: Secondary | ICD-10-CM | POA: Diagnosis not present

## 2019-11-11 DIAGNOSIS — E039 Hypothyroidism, unspecified: Secondary | ICD-10-CM | POA: Diagnosis not present

## 2019-11-11 DIAGNOSIS — I679 Cerebrovascular disease, unspecified: Secondary | ICD-10-CM | POA: Diagnosis not present

## 2019-11-11 DIAGNOSIS — I6529 Occlusion and stenosis of unspecified carotid artery: Secondary | ICD-10-CM | POA: Diagnosis not present

## 2019-11-11 DIAGNOSIS — Z0000 Encounter for general adult medical examination without abnormal findings: Secondary | ICD-10-CM | POA: Diagnosis not present

## 2019-11-11 DIAGNOSIS — N183 Chronic kidney disease, stage 3 unspecified: Secondary | ICD-10-CM | POA: Diagnosis not present

## 2019-11-13 ENCOUNTER — Other Ambulatory Visit: Payer: Self-pay | Admitting: Cardiology

## 2019-11-13 DIAGNOSIS — I6523 Occlusion and stenosis of bilateral carotid arteries: Secondary | ICD-10-CM

## 2019-11-14 DIAGNOSIS — J301 Allergic rhinitis due to pollen: Secondary | ICD-10-CM | POA: Diagnosis not present

## 2019-11-14 DIAGNOSIS — J3089 Other allergic rhinitis: Secondary | ICD-10-CM | POA: Diagnosis not present

## 2019-11-14 DIAGNOSIS — J3081 Allergic rhinitis due to animal (cat) (dog) hair and dander: Secondary | ICD-10-CM | POA: Diagnosis not present

## 2019-11-15 ENCOUNTER — Other Ambulatory Visit: Payer: Self-pay

## 2019-11-21 ENCOUNTER — Other Ambulatory Visit: Payer: Medicare Other

## 2019-11-21 DIAGNOSIS — J301 Allergic rhinitis due to pollen: Secondary | ICD-10-CM | POA: Diagnosis not present

## 2019-11-21 DIAGNOSIS — J3081 Allergic rhinitis due to animal (cat) (dog) hair and dander: Secondary | ICD-10-CM | POA: Diagnosis not present

## 2019-11-21 DIAGNOSIS — J3089 Other allergic rhinitis: Secondary | ICD-10-CM | POA: Diagnosis not present

## 2019-11-23 DIAGNOSIS — I129 Hypertensive chronic kidney disease with stage 1 through stage 4 chronic kidney disease, or unspecified chronic kidney disease: Secondary | ICD-10-CM | POA: Diagnosis not present

## 2019-11-23 DIAGNOSIS — E785 Hyperlipidemia, unspecified: Secondary | ICD-10-CM | POA: Diagnosis not present

## 2019-11-23 DIAGNOSIS — N1832 Chronic kidney disease, stage 3b: Secondary | ICD-10-CM | POA: Diagnosis not present

## 2019-11-23 DIAGNOSIS — D631 Anemia in chronic kidney disease: Secondary | ICD-10-CM | POA: Diagnosis not present

## 2019-11-23 DIAGNOSIS — E039 Hypothyroidism, unspecified: Secondary | ICD-10-CM | POA: Diagnosis not present

## 2019-11-23 DIAGNOSIS — E1122 Type 2 diabetes mellitus with diabetic chronic kidney disease: Secondary | ICD-10-CM | POA: Diagnosis not present

## 2019-11-23 DIAGNOSIS — I2510 Atherosclerotic heart disease of native coronary artery without angina pectoris: Secondary | ICD-10-CM | POA: Diagnosis not present

## 2019-11-29 DIAGNOSIS — J3089 Other allergic rhinitis: Secondary | ICD-10-CM | POA: Diagnosis not present

## 2019-11-29 DIAGNOSIS — J3081 Allergic rhinitis due to animal (cat) (dog) hair and dander: Secondary | ICD-10-CM | POA: Diagnosis not present

## 2019-11-29 DIAGNOSIS — J301 Allergic rhinitis due to pollen: Secondary | ICD-10-CM | POA: Diagnosis not present

## 2019-12-06 DIAGNOSIS — J301 Allergic rhinitis due to pollen: Secondary | ICD-10-CM | POA: Diagnosis not present

## 2019-12-06 DIAGNOSIS — J3089 Other allergic rhinitis: Secondary | ICD-10-CM | POA: Diagnosis not present

## 2019-12-06 DIAGNOSIS — J3081 Allergic rhinitis due to animal (cat) (dog) hair and dander: Secondary | ICD-10-CM | POA: Diagnosis not present

## 2019-12-08 ENCOUNTER — Encounter: Payer: Self-pay | Admitting: Cardiology

## 2019-12-08 ENCOUNTER — Ambulatory Visit: Payer: Medicare Other | Admitting: Cardiology

## 2019-12-08 ENCOUNTER — Other Ambulatory Visit: Payer: Self-pay

## 2019-12-08 VITALS — BP 111/62 | HR 63 | Resp 17 | Ht 66.0 in | Wt 189.0 lb

## 2019-12-08 DIAGNOSIS — N1831 Chronic kidney disease, stage 3a: Secondary | ICD-10-CM | POA: Diagnosis not present

## 2019-12-08 DIAGNOSIS — E7800 Pure hypercholesterolemia, unspecified: Secondary | ICD-10-CM | POA: Diagnosis not present

## 2019-12-08 DIAGNOSIS — I6523 Occlusion and stenosis of bilateral carotid arteries: Secondary | ICD-10-CM | POA: Diagnosis not present

## 2019-12-08 DIAGNOSIS — I2510 Atherosclerotic heart disease of native coronary artery without angina pectoris: Secondary | ICD-10-CM | POA: Diagnosis not present

## 2019-12-08 DIAGNOSIS — I10 Essential (primary) hypertension: Secondary | ICD-10-CM

## 2019-12-08 MED ORDER — EZETIMIBE 10 MG PO TABS: 10.0000 mg | ORAL_TABLET | Freq: Every day | ORAL | 3 refills | Status: DC

## 2019-12-08 NOTE — Progress Notes (Signed)
Primary Physician/Referring:  Aura Dials, MD  Patient ID: Daniel Quinn, male    DOB: [birth year], 74 y.o.   MRN: [id number]  Chief Complaint  Patient presents with  . Carotid  . Coronary Artery Disease   HPI:    Ayinde Swim  is a 74 y.o. Caucasian male with history of stroke in 2016 for high-grade left vertebral artery stenosis, hypertension, hyperlipidemia, asymptomatic bilateral carotid artery stenosis, multivessel coronary artery disease with angioplasty to his right coronary artery on 02/05/2016 followed by staged intervention to LAD and proximal and mid circumflex coronary artery on 02/26/2016. This is annual visit.  He had not used any sublingual nitroglycerin. Denies chest pain, shortness of breath, symptoms of claudication, dizziness or neurologic deficit.  He was seen by nephrologist for stage IIIa chronic kidney disease and was started on losartan but no other changes were done.  Past Medical History:  Diagnosis Date  . Anxiety   . BCC (basal cell carcinoma of skin) 04/01/2014   bcc ulc. right forehead tx exc  . BCC (basal cell carcinoma of skin) 04/26/2015   bcc + marg reck 11mo . BCC (basal cell carcinoma of skin) 06/09/2016   bcc sup nod right mid back tx cx3 556f . BCC (basal cell carcinoma of skin) 01/14/2018   bcc nod. left nose tx MOHS  . Coronary artery disease   . CVA (cerebral infarction) 07/01/14  . Depression   . Difficulty swallowing   . Environmental allergies   . GERD (gastroesophageal reflux disease)   . Hyperlipidemia   . Hypertension   . Hypothyroid   . Macrocytosis   . Polyarthritis    chronic  . Restless leg   . 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 SCC (squamous cell carcinoma) 01/14/2018   scc in situ left arm tx cx3 [id number]. Squamous cell carcinoma of skin 07/16/2019   in situ-right forearm posterior  ( watch, no treatment needed per JCB)  . [id number]   "right leg drags when  I get tired; very little feeling of hot/cold on my RUE/RLE now" (02/05/2016)  . Swelling    right arm/hand, legs "since my stroke in 2015" (02/05/2016)   Past Surgical History:  Procedure Laterality Date  . CARDIAC CATHETERIZATION N/A 02/05/2016   Procedure: Left Heart Cath and Coronary Angiography;  Surgeon: JayAdrian ProwsD;  Location: [facility];  Service: Cardiovascular;  Laterality: N/A;  . CARDIAC CATHETERIZATION N/A 02/05/2016   Procedure: Coronary Stent Intervention;  Surgeon: JayAdrian ProwsD;  Location: [facility] [place] [facility];  Service: Cardiovascular;  Laterality: N/A;  . CARDIAC CATHETERIZATION N/A 02/26/2016   Procedure: Coronary Stent Intervention;  Surgeon: JayAdrian ProwsD;  Location: [facility] [place] [facility];  Service: Cardiovascular;  Laterality: N/A;  . CORONARY STENT PLACEMENT  02/26/2016   Successful atherectomy with CSI diamondback catheter followed by stenting with 3.0 x 18 mm onyx in the proximal LAD, stenosis reduced from 90% to 0%.  . NMarland KitchenSAL SEPTUM SURGERY  2000s   Social History   Tobacco Use  . Smoking status: Never Smoker  . Smokeless tobacco: Never Used  Substance Use Topics  . Alcohol use: Yes    Alcohol/week: 3.0 standard drinks    Types: 3 Cans of beer per week   Marital Status: Widowed   ROS  Review of Systems  Cardiovascular: Negative for chest pain, dyspnea on exertion and leg swelling.  Gastrointestinal:  Negative for melena.   Objective  Blood pressure 111/62, pulse 63, resp. rate 17, height 5' 6" (1.676 m), weight 189 lb (85.7 kg), SpO2 95 %.  Vitals with BMI 12/08/2019 03/09/2019 02/27/2016  Height 5' 6" 5' 6" -  Weight 189 lbs 190 lbs 13 oz -  BMI 06.30 16.01 -  Systolic 093 235 573  Diastolic 62 70 60  Pulse 63 58 63     Physical Exam Cardiovascular:     Rate and Rhythm: Normal rate and regular rhythm.     Pulses: Normal pulses and intact distal pulses.          Carotid pulses are on the right side with bruit.    Heart sounds: Normal heart  sounds. No murmur heard.  No gallop.      Comments: No leg edema, no JVD. Pulmonary:     Effort: Pulmonary effort is normal.     Breath sounds: Normal breath sounds.  Abdominal:     General: Bowel sounds are normal.     Palpations: Abdomen is soft.    Laboratory examination:   No results for input(s): NA, K, CL, CO2, GLUCOSE, BUN, CREATININE, CALCIUM, GFRNONAA, GFRAA in the last 8760 hours. CrCl cannot be calculated (Patient's most recent lab result is older than the maximum 21 days allowed.).  CMP Latest Ref Rng & Units 02/27/2016 02/06/2016 07/03/2014  Glucose 65 - 99 mg/dL 107(H) 122(H) 92  BUN 6 - 20 mg/dL 23(H) 16 18  Creatinine 0.61 - 1.24 mg/dL 1.26(H) 1.15 0.90  Sodium 135 - 145 mmol/L 138 138 135  Potassium 3.5 - 5.1 mmol/L 4.1 3.6 3.8  Chloride 101 - 111 mmol/L 106 104 102  CO2 22 - 32 mmol/L _0 Calcium 8.9 - 10.3 mg/dL 8.6(L) 8.9 8.4  Total Protein 6.0 - 8.3 g/dL - - -  Total Bilirubin 0.3 - 1.2 mg/dL - - -  Alkaline Phos 39 - 117 U/L - - -  AST 0 - 37 U/L - - -  ALT 0 - 53 U/L - - -   CBC Latest Ref Rng & Units 02/27/2016 02/06/2016 07/01/2014  WBC 4.0 - 10.5 K/uL 8.2 11.0(H) -  Hemoglobin 13.0 - 17.0 g/dL 11.8(L) 13.2 16.0  Hematocrit 39 - 52 % 34.8(L) 37.5(L) 47.0  Platelets 150 - 400 K/uL 194 214 -   Lipid Panel No results for input(s): CHOL, TRIG, LDLCALC, VLDL, HDL, CHOLHDL, LDLDIRECT in the last 8760 hours.  HEMOGLOBIN A1C Lab Results  Component Value Date   HGBA1C 6.0 (H) 07/02/2014   MPG 126 07/02/2014   TSH No results for input(s): TSH in the last 8760 hours.  External labs:  Cholesterol, total 149.000 11/11/2019 HDL 45.000 11/11/2019 LDL-C 83.000 11/11/2019 Triglycerides 111.000 11/11/2019  A1C 6.400 07/18/2019 TSH 5.910 11/11/2019  Hemoglobin 14.100 11/11/2019  Creatinine, Serum 1.270 11/11/2019 Potassium 4.700 11/11/2019 ALT (SGPT) 20.000 11/11/2019   05/05/2017: Creatinine 1.23, EGFR 58/71, potassium 5.1, CMP normal. TSH 3.4. Free T4  normal. Cholesterol 150, triglycerides 148, HDL 39, LDL 81.  Medications and allergies   Allergies  Allergen Reactions  . Brilinta [Ticagrelor]     Fever, elevated blood pressure, trembling    Current Outpatient Medications on File Prior to Visit  Medication Sig Dispense Refill  . Ascorbic Acid (VITAMIN C) 1000 MG tablet Take 1,000 mg by mouth daily.    Marland Kitchen aspirin 81 MG chewable tablet Chew 81 mg by mouth daily.    Marland Kitchen atorvastatin (LIPITOR) 40 MG tablet Take  1 tablet (40 mg total) by mouth daily at 6 PM. 30 tablet 0  . azelastine (ASTELIN) 0.1 % nasal spray Place 2 sprays into both nostrils 2 (two) times daily. Use in each nostril as directed    . FLUoxetine (PROZAC) 20 MG capsule Take 20 mg by mouth daily.    . fluticasone (FLONASE) 50 MCG/ACT nasal spray Place 1 spray into both nostrils daily.    . glipiZIDE (GLUCOTROL XL) 5 MG 24 hr tablet Take 5 mg by mouth daily.    . levocetirizine (XYZAL) 5 MG tablet Take 5 mg by mouth daily.    . levothyroxine (SYNTHROID, LEVOTHROID) 112 MCG tablet Take 112 mcg by mouth daily before breakfast.    . losartan-hydrochlorothiazide (HYZAAR) 50-12.5 MG tablet Take 1 tablet by mouth daily.    . Melatonin 10 MG CAPS Take 1 tablet by mouth as needed.    . metoprolol succinate (TOPROL-XL) 25 MG 24 hr tablet TAKE 1 TABLET BY MOUTH EVERY DAY 90 tablet 2  . montelukast (SINGULAIR) 10 MG tablet Take 10 mg by mouth daily.    . Multiple Vitamin (MULTIVITAMIN WITH MINERALS) TABS tablet Take 1 tablet by mouth daily.    . nitroGLYCERIN (NITROSTAT) 0.4 MG SL tablet Place 1 tablet (0.4 mg total) under the tongue every 5 (five) minutes as needed for chest pain. 25 tablet 3  . omeprazole (PRILOSEC) 20 MG capsule Take 20 mg by mouth daily.    . PROVENTIL HFA 108 (90 BASE) MCG/ACT inhaler Inhale 1-2 puffs into the lungs every 4 (four) hours as needed for wheezing or shortness of breath.   0  . Vitamins/Minerals TABS Take by mouth.    . acetaminophen (TYLENOL) 325 MG tablet  Take 650 mg by mouth every 6 (six) hours as needed for mild pain or headache.    . amLODipine (NORVASC) 10 MG tablet Take 1 tablet (10 mg total) by mouth daily. 30 tablet 1  . EPINEPHrine 0.3 mg/0.3 mL IJ SOAJ injection SMARTSIG:Injection As Directed     No current facility-administered medications on file prior to visit.    Radiology:  No results found.  Cardiac Studies:   Echocardiogram 01/31/2016: Left ventricle cavity is normal in size. Borderline decrease in global wall motion. Visual EF is 45-50%. Left atrial cavity is mildly dilated at 4.3 cm. Mild to moderate mitral regurgitation. Mild tricuspid regurgitation. No evidence of pulmonary hypertension.  Left Heart Catheterization 02/05/2016: 1. Normal LV systolic function, EF 55%. 2. High-grade proximal RCA, distal RCA 95% stenosis, S/P 3.0 x 18 mm and a 2.5 x 15 mm Xience Alpine DES, stenosis reduced to 0%. 3. Calcific LAD and Cx stenosis scheduled for staged PCI.  Left Heart Catheterization 12/0/2017: Successful atherectomy with CSI diamondback catheter followed by stenting 3.0 x 18 mm onyx in the proximal LAD.  Stenting of the proximal circumflex and mid circumflex coronary artery with overlapping 3.0 x 12 mm onyx and 3.0 x 15 mm Xience Alpine DES.  Carotid artery duplex 11/09/2019:  Stenosis in the right internal carotid artery (50-69%). Stenosis in the  right external carotid artery (<50%).  Stenosis in the left internal carotid artery (16-49%).  Antegrade right vertebral artery flow. Antegrade left vertebral artery  flow.  Compared to the study done on 11/08/2018, mild progression in right  carotid stenosis. Follow up in 6 months is appropriate if clinically  Indicated.  EKG:  EKG 12/08/2019: Normal sinus rhythm with rate of 60 bpm, normal axis.  No evidence of ischemia, normal   EKG.  No significant change from 01/07/2019.    Assessment     ICD-10-CM   1. Coronary artery disease involving native coronary artery of  native heart without angina pectoris  I25.10 EKG 12-Lead  2. Asymptomatic bilateral carotid artery stenosis  I65.23   3. Hypercholesteremia  E78.00 ezetimibe (ZETIA) 10 MG tablet    Lipid Panel With LDL/HDL Ratio    Lipid Panel With LDL/HDL Ratio  4. Primary hypertension  I10   5. Stage 3a chronic kidney disease  N18.31     Meds ordered this encounter  Medications  . ezetimibe (ZETIA) 10 MG tablet    Sig: Take 1 tablet (10 mg total) by mouth daily.    Dispense:  90 tablet    Refill:  3    Medications Discontinued During This Encounter  Medication Reason  . isosorbide mononitrate (IMDUR) 60 MG 24 hr tablet Discontinued by provider  . metFORMIN (GLUCOPHAGE-XR) 500 MG 24 hr tablet Discontinued by provider  . lisinopril (PRINIVIL,ZESTRIL) 20 MG tablet Discontinued by provider  . pantoprazole (PROTONIX) 20 MG tablet Completed Course    Recommendations:   Breccan Salas is a 73 y.o. Caucasian male with history of stroke in 2016 for high-grade left vertebral artery stenosis, hypertension, hyperlipidemia, asymptomatic bilateral carotid artery stenosis, multivessel coronary artery disease with angioplasty to his right coronary artery on 02/05/2016 followed by staged intervention to LAD and proximal and mid circumflex coronary artery on 02/26/2016.   Is presently on appropriate medical therapy, this is annual visit.  I reviewed the results of the carotid artery duplex, indeed there is mild progression of carotid disease on the right.  I reviewed his external labs, LDL is not at goal, I will add Zetia 10 mg daily, will recheck lipids in 2 to 3 months.  We will continue carotid artery surveillance.  From cardiac standpoint he is without angina and no clinical evidence of heart failure.  Blood pressure is also well controlled.  I will see him back in 6 months for close monitoring.     Jay Ganji, MD, FACC 12/08/2019, 1:44 PM Office: [phone]  

## 2019-12-09 DIAGNOSIS — N1832 Chronic kidney disease, stage 3b: Secondary | ICD-10-CM | POA: Diagnosis not present

## 2019-12-13 DIAGNOSIS — J301 Allergic rhinitis due to pollen: Secondary | ICD-10-CM | POA: Diagnosis not present

## 2019-12-13 DIAGNOSIS — J3089 Other allergic rhinitis: Secondary | ICD-10-CM | POA: Diagnosis not present

## 2019-12-13 DIAGNOSIS — J3081 Allergic rhinitis due to animal (cat) (dog) hair and dander: Secondary | ICD-10-CM | POA: Diagnosis not present

## 2019-12-19 ENCOUNTER — Ambulatory Visit (INDEPENDENT_AMBULATORY_CARE_PROVIDER_SITE_OTHER): Payer: Medicare Other | Admitting: Physician Assistant

## 2019-12-19 ENCOUNTER — Encounter: Payer: Self-pay | Admitting: Physician Assistant

## 2019-12-19 ENCOUNTER — Other Ambulatory Visit: Payer: Self-pay

## 2019-12-19 DIAGNOSIS — C44612 Basal cell carcinoma of skin of right upper limb, including shoulder: Secondary | ICD-10-CM

## 2019-12-19 DIAGNOSIS — I6523 Occlusion and stenosis of bilateral carotid arteries: Secondary | ICD-10-CM

## 2019-12-19 DIAGNOSIS — L570 Actinic keratosis: Secondary | ICD-10-CM

## 2019-12-19 DIAGNOSIS — J3081 Allergic rhinitis due to animal (cat) (dog) hair and dander: Secondary | ICD-10-CM | POA: Diagnosis not present

## 2019-12-19 DIAGNOSIS — Z85828 Personal history of other malignant neoplasm of skin: Secondary | ICD-10-CM | POA: Diagnosis not present

## 2019-12-19 DIAGNOSIS — J3089 Other allergic rhinitis: Secondary | ICD-10-CM | POA: Diagnosis not present

## 2019-12-19 DIAGNOSIS — Z1283 Encounter for screening for malignant neoplasm of skin: Secondary | ICD-10-CM

## 2019-12-19 DIAGNOSIS — Z86007 Personal history of in-situ neoplasm of skin: Secondary | ICD-10-CM | POA: Diagnosis not present

## 2019-12-19 DIAGNOSIS — D485 Neoplasm of uncertain behavior of skin: Secondary | ICD-10-CM

## 2019-12-19 DIAGNOSIS — J301 Allergic rhinitis due to pollen: Secondary | ICD-10-CM | POA: Diagnosis not present

## 2019-12-19 NOTE — Progress Notes (Signed)
   Follow up Visit  Subjective  Derrious Bologna is a 74 y.o. male who presents for the following: Follow-up (Concerns under left arm looks like keratosis). Also recheck SCC  Right forearm that was biopsied in April 2021 and we chose not to do surgery because the biopsy site appeared clear at the last visit. He still has no scale or scab at this area.  Objective  Well appearing patient in no apparent distress; mood and affect are within normal limits.  All skin waist up examined. No suspicious moles noted on back.   Objective  Left Buccal Cheek , Left Forearm - Anterior: Scar noted right forearm still appears to be clear.  Objective  Mid Back: upper body skin check  Objective  Right Shoulder - Anterior: Pink pearly papule     Objective  Left Ear, Left Malar Cheek, Left Temple, Right Malar Cheek, Right Temple: Erythematous patches with gritty scale.  Assessment & Plan  History of basal cell carcinoma (BCC) of skin (3) Mid Back; Left Nasal Sidewall; Right Forehead  History of squamous cell carcinoma in situ (SCCIS) (2) Left Forearm - Anterior; Left Buccal Cheek   Screening for malignant neoplasm of skin Mid Back  Neoplasm of uncertain behavior of skin Right Shoulder - Anterior  Skin / nail biopsy Type of biopsy: tangential   Informed consent: discussed and consent obtained   Timeout: patient name, date of birth, surgical site, and procedure verified   Procedure prep:  Patient was prepped and draped in usual sterile fashion (Non sterile) Prep type:  Chlorhexidine Anesthesia: the lesion was anesthetized in a standard fashion   Anesthetic:  1% lidocaine w/ epinephrine 1-100,000 local infiltration Instrument used: flexible razor blade   Outcome: patient tolerated procedure well   Post-procedure details: wound care instructions given    Specimen 1 - Surgical pathology Differential Diagnosis: scc vs bcc Check Margins: No  AK (actinic keratosis) (5) Left Ear; Left  Temple; Right Temple; Left Malar Cheek; Right Malar Cheek  He will use his fluorouracil cream to these areas nightly for 1 week and take 2 weeks off and do 1 week again.

## 2019-12-19 NOTE — Patient Instructions (Signed)

## 2019-12-26 ENCOUNTER — Telehealth: Payer: Self-pay | Admitting: *Deleted

## 2019-12-26 ENCOUNTER — Encounter: Payer: Self-pay | Admitting: *Deleted

## 2019-12-26 DIAGNOSIS — J3089 Other allergic rhinitis: Secondary | ICD-10-CM | POA: Diagnosis not present

## 2019-12-26 DIAGNOSIS — J3081 Allergic rhinitis due to animal (cat) (dog) hair and dander: Secondary | ICD-10-CM | POA: Diagnosis not present

## 2019-12-26 DIAGNOSIS — J301 Allergic rhinitis due to pollen: Secondary | ICD-10-CM | POA: Diagnosis not present

## 2019-12-26 NOTE — Telephone Encounter (Signed)
-----   Message from Arlyss Gandy, PA-C sent at 12/26/2019  8:32 AM EDT ----- 30 min

## 2019-12-26 NOTE — Telephone Encounter (Signed)
Pathology to patient, surgery scheduled  

## 2019-12-26 NOTE — Telephone Encounter (Signed)
Left message for patient to return our phone call.

## 2020-01-02 DIAGNOSIS — J3089 Other allergic rhinitis: Secondary | ICD-10-CM | POA: Diagnosis not present

## 2020-01-02 DIAGNOSIS — J301 Allergic rhinitis due to pollen: Secondary | ICD-10-CM | POA: Diagnosis not present

## 2020-01-02 DIAGNOSIS — J3081 Allergic rhinitis due to animal (cat) (dog) hair and dander: Secondary | ICD-10-CM | POA: Diagnosis not present

## 2020-01-09 DIAGNOSIS — J3089 Other allergic rhinitis: Secondary | ICD-10-CM | POA: Diagnosis not present

## 2020-01-09 DIAGNOSIS — J3081 Allergic rhinitis due to animal (cat) (dog) hair and dander: Secondary | ICD-10-CM | POA: Diagnosis not present

## 2020-01-09 DIAGNOSIS — J301 Allergic rhinitis due to pollen: Secondary | ICD-10-CM | POA: Diagnosis not present

## 2020-01-15 ENCOUNTER — Other Ambulatory Visit: Payer: Self-pay | Admitting: Cardiology

## 2020-01-16 DIAGNOSIS — Z23 Encounter for immunization: Secondary | ICD-10-CM | POA: Diagnosis not present

## 2020-01-20 DIAGNOSIS — J3081 Allergic rhinitis due to animal (cat) (dog) hair and dander: Secondary | ICD-10-CM | POA: Diagnosis not present

## 2020-01-20 DIAGNOSIS — J301 Allergic rhinitis due to pollen: Secondary | ICD-10-CM | POA: Diagnosis not present

## 2020-01-20 DIAGNOSIS — J3089 Other allergic rhinitis: Secondary | ICD-10-CM | POA: Diagnosis not present

## 2020-01-25 DIAGNOSIS — J3081 Allergic rhinitis due to animal (cat) (dog) hair and dander: Secondary | ICD-10-CM | POA: Diagnosis not present

## 2020-01-25 DIAGNOSIS — J301 Allergic rhinitis due to pollen: Secondary | ICD-10-CM | POA: Diagnosis not present

## 2020-01-25 DIAGNOSIS — J3089 Other allergic rhinitis: Secondary | ICD-10-CM | POA: Diagnosis not present

## 2020-01-26 DIAGNOSIS — M62838 Other muscle spasm: Secondary | ICD-10-CM | POA: Diagnosis not present

## 2020-01-30 DIAGNOSIS — J3089 Other allergic rhinitis: Secondary | ICD-10-CM | POA: Diagnosis not present

## 2020-01-30 DIAGNOSIS — J301 Allergic rhinitis due to pollen: Secondary | ICD-10-CM | POA: Diagnosis not present

## 2020-02-06 DIAGNOSIS — J3081 Allergic rhinitis due to animal (cat) (dog) hair and dander: Secondary | ICD-10-CM | POA: Diagnosis not present

## 2020-02-06 DIAGNOSIS — J301 Allergic rhinitis due to pollen: Secondary | ICD-10-CM | POA: Diagnosis not present

## 2020-02-06 DIAGNOSIS — J3089 Other allergic rhinitis: Secondary | ICD-10-CM | POA: Diagnosis not present

## 2020-02-07 DIAGNOSIS — M5416 Radiculopathy, lumbar region: Secondary | ICD-10-CM | POA: Diagnosis not present

## 2020-02-09 DIAGNOSIS — J3089 Other allergic rhinitis: Secondary | ICD-10-CM | POA: Diagnosis not present

## 2020-02-09 DIAGNOSIS — J3081 Allergic rhinitis due to animal (cat) (dog) hair and dander: Secondary | ICD-10-CM | POA: Diagnosis not present

## 2020-02-09 DIAGNOSIS — J301 Allergic rhinitis due to pollen: Secondary | ICD-10-CM | POA: Diagnosis not present

## 2020-02-13 DIAGNOSIS — J3081 Allergic rhinitis due to animal (cat) (dog) hair and dander: Secondary | ICD-10-CM | POA: Diagnosis not present

## 2020-02-13 DIAGNOSIS — J3089 Other allergic rhinitis: Secondary | ICD-10-CM | POA: Diagnosis not present

## 2020-02-13 DIAGNOSIS — J301 Allergic rhinitis due to pollen: Secondary | ICD-10-CM | POA: Diagnosis not present

## 2020-02-20 DIAGNOSIS — J3089 Other allergic rhinitis: Secondary | ICD-10-CM | POA: Diagnosis not present

## 2020-02-20 DIAGNOSIS — J3081 Allergic rhinitis due to animal (cat) (dog) hair and dander: Secondary | ICD-10-CM | POA: Diagnosis not present

## 2020-02-20 DIAGNOSIS — J301 Allergic rhinitis due to pollen: Secondary | ICD-10-CM | POA: Diagnosis not present

## 2020-02-22 DIAGNOSIS — E785 Hyperlipidemia, unspecified: Secondary | ICD-10-CM | POA: Diagnosis not present

## 2020-02-22 DIAGNOSIS — E1122 Type 2 diabetes mellitus with diabetic chronic kidney disease: Secondary | ICD-10-CM | POA: Diagnosis not present

## 2020-02-22 DIAGNOSIS — I2510 Atherosclerotic heart disease of native coronary artery without angina pectoris: Secondary | ICD-10-CM | POA: Diagnosis not present

## 2020-02-22 DIAGNOSIS — E039 Hypothyroidism, unspecified: Secondary | ICD-10-CM | POA: Diagnosis not present

## 2020-02-22 DIAGNOSIS — N1831 Chronic kidney disease, stage 3a: Secondary | ICD-10-CM | POA: Diagnosis not present

## 2020-02-22 DIAGNOSIS — I129 Hypertensive chronic kidney disease with stage 1 through stage 4 chronic kidney disease, or unspecified chronic kidney disease: Secondary | ICD-10-CM | POA: Diagnosis not present

## 2020-02-23 ENCOUNTER — Encounter: Payer: Self-pay | Admitting: Dermatology

## 2020-02-23 ENCOUNTER — Other Ambulatory Visit: Payer: Self-pay

## 2020-02-23 ENCOUNTER — Ambulatory Visit (INDEPENDENT_AMBULATORY_CARE_PROVIDER_SITE_OTHER): Payer: Medicare Other | Admitting: Dermatology

## 2020-02-23 DIAGNOSIS — Z85828 Personal history of other malignant neoplasm of skin: Secondary | ICD-10-CM | POA: Diagnosis not present

## 2020-02-23 DIAGNOSIS — C44612 Basal cell carcinoma of skin of right upper limb, including shoulder: Secondary | ICD-10-CM

## 2020-02-23 DIAGNOSIS — L570 Actinic keratosis: Secondary | ICD-10-CM

## 2020-02-23 DIAGNOSIS — C4491 Basal cell carcinoma of skin, unspecified: Secondary | ICD-10-CM

## 2020-02-27 DIAGNOSIS — J3089 Other allergic rhinitis: Secondary | ICD-10-CM | POA: Diagnosis not present

## 2020-02-27 DIAGNOSIS — J301 Allergic rhinitis due to pollen: Secondary | ICD-10-CM | POA: Diagnosis not present

## 2020-02-27 DIAGNOSIS — J3081 Allergic rhinitis due to animal (cat) (dog) hair and dander: Secondary | ICD-10-CM | POA: Diagnosis not present

## 2020-02-29 ENCOUNTER — Encounter: Payer: Self-pay | Admitting: Dermatology

## 2020-02-29 NOTE — Progress Notes (Addendum)
   Follow-Up Visit   Subjective  Daniel Quinn is a 74 y.o. male who presents for the following: Procedure (hre for treatment- bcc x 1 -right shoulder-anterior).  BCC Location: Right shoulder Duration:  Quality:  Associated Signs/Symptoms: Modifying Factors:  Severity:  Timing: Context: For treatment  Objective  Well appearing patient in no apparent distress; mood and affect are within normal limits.  All skin waist up examined.   Assessment & Plan    History of squamous cell carcinoma of skin Left Malar Cheek  Yearly skin check  History of basal cell cancer Right Forehead  Yearly skin check  Basal cell carcinoma (BCC), unspecified site Right Shoulder - Anterior  Destruction of lesion Complexity: simple   Destruction method: electrodesiccation and curettage   Informed consent: discussed and consent obtained   Timeout:  patient name, date of birth, surgical site, and procedure verified Anesthesia: the lesion was anesthetized in a standard fashion   Anesthetic:  1% lidocaine w/ epinephrine 1-100,000 local infiltration Curettage performed in three different directions: Yes   Electrodesiccation performed over the curetted area: Yes   Curettage cycles:  3 Lesion length (cm):  1.2 Lesion width (cm):  1 Margin per side (cm):  0 Final wound size (cm):  1.2 Hemostasis achieved with:  aluminum chloride Outcome: patient tolerated procedure well with no complications   Post-procedure details: wound care instructions given   Additional details:  Wound inoculated with 5% fluorouracil solution  Follow up in 3 months  AK (actinic keratosis) Left Temporal Scalp  Not bothersome to patient or historically growing, defer intervention for now.     I, Lavonna Monarch, MD, have reviewed all documentation for this visit.  The documentation on 03/07/20 for the exam, diagnosis, procedures, and orders are all accurate and complete.

## 2020-03-05 DIAGNOSIS — J301 Allergic rhinitis due to pollen: Secondary | ICD-10-CM | POA: Diagnosis not present

## 2020-03-05 DIAGNOSIS — J3089 Other allergic rhinitis: Secondary | ICD-10-CM | POA: Diagnosis not present

## 2020-03-05 DIAGNOSIS — M5416 Radiculopathy, lumbar region: Secondary | ICD-10-CM | POA: Diagnosis not present

## 2020-03-05 DIAGNOSIS — J3081 Allergic rhinitis due to animal (cat) (dog) hair and dander: Secondary | ICD-10-CM | POA: Diagnosis not present

## 2020-03-06 DIAGNOSIS — E7800 Pure hypercholesterolemia, unspecified: Secondary | ICD-10-CM | POA: Diagnosis not present

## 2020-03-07 LAB — LIPID PANEL WITH LDL/HDL RATIO
Cholesterol, Total: 140 mg/dL (ref 100–199)
HDL: 42 mg/dL (ref >39)
LDL Chol Calc (NIH): 79 mg/dL (ref 0–99)
LDL/HDL Ratio: 1.9 ratio (ref 0.0–3.6)
Triglycerides: 100 mg/dL (ref 0–149)
VLDL Cholesterol Cal: 19 mg/dL (ref 5–40)

## 2020-03-07 NOTE — Addendum Note (Signed)
Addended by: Lavonna Monarch on: 03/07/2020 07:12 AM   Modules accepted: Level of Service

## 2020-03-12 DIAGNOSIS — J3089 Other allergic rhinitis: Secondary | ICD-10-CM | POA: Diagnosis not present

## 2020-03-12 DIAGNOSIS — J301 Allergic rhinitis due to pollen: Secondary | ICD-10-CM | POA: Diagnosis not present

## 2020-03-12 DIAGNOSIS — J3081 Allergic rhinitis due to animal (cat) (dog) hair and dander: Secondary | ICD-10-CM | POA: Diagnosis not present

## 2020-03-14 DIAGNOSIS — E785 Hyperlipidemia, unspecified: Secondary | ICD-10-CM | POA: Diagnosis not present

## 2020-03-14 DIAGNOSIS — I10 Essential (primary) hypertension: Secondary | ICD-10-CM | POA: Diagnosis not present

## 2020-03-14 DIAGNOSIS — I679 Cerebrovascular disease, unspecified: Secondary | ICD-10-CM | POA: Diagnosis not present

## 2020-03-14 DIAGNOSIS — E039 Hypothyroidism, unspecified: Secondary | ICD-10-CM | POA: Diagnosis not present

## 2020-03-14 DIAGNOSIS — I2510 Atherosclerotic heart disease of native coronary artery without angina pectoris: Secondary | ICD-10-CM | POA: Diagnosis not present

## 2020-03-14 DIAGNOSIS — J302 Other seasonal allergic rhinitis: Secondary | ICD-10-CM | POA: Diagnosis not present

## 2020-03-15 DIAGNOSIS — J3089 Other allergic rhinitis: Secondary | ICD-10-CM | POA: Diagnosis not present

## 2020-03-15 DIAGNOSIS — J301 Allergic rhinitis due to pollen: Secondary | ICD-10-CM | POA: Diagnosis not present

## 2020-03-19 DIAGNOSIS — J3081 Allergic rhinitis due to animal (cat) (dog) hair and dander: Secondary | ICD-10-CM | POA: Diagnosis not present

## 2020-03-19 DIAGNOSIS — J301 Allergic rhinitis due to pollen: Secondary | ICD-10-CM | POA: Diagnosis not present

## 2020-03-19 DIAGNOSIS — J3089 Other allergic rhinitis: Secondary | ICD-10-CM | POA: Diagnosis not present

## 2020-03-22 DIAGNOSIS — J301 Allergic rhinitis due to pollen: Secondary | ICD-10-CM | POA: Diagnosis not present

## 2020-03-22 DIAGNOSIS — J3089 Other allergic rhinitis: Secondary | ICD-10-CM | POA: Diagnosis not present

## 2020-03-27 DIAGNOSIS — R0981 Nasal congestion: Secondary | ICD-10-CM | POA: Diagnosis not present

## 2020-03-27 DIAGNOSIS — Z20818 Contact with and (suspected) exposure to other bacterial communicable diseases: Secondary | ICD-10-CM | POA: Diagnosis not present

## 2020-04-02 DIAGNOSIS — H1045 Other chronic allergic conjunctivitis: Secondary | ICD-10-CM | POA: Diagnosis not present

## 2020-04-02 DIAGNOSIS — J301 Allergic rhinitis due to pollen: Secondary | ICD-10-CM | POA: Diagnosis not present

## 2020-04-02 DIAGNOSIS — J3081 Allergic rhinitis due to animal (cat) (dog) hair and dander: Secondary | ICD-10-CM | POA: Diagnosis not present

## 2020-04-02 DIAGNOSIS — J3089 Other allergic rhinitis: Secondary | ICD-10-CM | POA: Diagnosis not present

## 2020-04-10 DIAGNOSIS — J3089 Other allergic rhinitis: Secondary | ICD-10-CM | POA: Diagnosis not present

## 2020-04-10 DIAGNOSIS — J301 Allergic rhinitis due to pollen: Secondary | ICD-10-CM | POA: Diagnosis not present

## 2020-04-10 DIAGNOSIS — J3081 Allergic rhinitis due to animal (cat) (dog) hair and dander: Secondary | ICD-10-CM | POA: Diagnosis not present

## 2020-04-16 DIAGNOSIS — J3081 Allergic rhinitis due to animal (cat) (dog) hair and dander: Secondary | ICD-10-CM | POA: Diagnosis not present

## 2020-04-16 DIAGNOSIS — J3089 Other allergic rhinitis: Secondary | ICD-10-CM | POA: Diagnosis not present

## 2020-04-16 DIAGNOSIS — J301 Allergic rhinitis due to pollen: Secondary | ICD-10-CM | POA: Diagnosis not present

## 2020-04-23 DIAGNOSIS — J301 Allergic rhinitis due to pollen: Secondary | ICD-10-CM | POA: Diagnosis not present

## 2020-04-23 DIAGNOSIS — J3089 Other allergic rhinitis: Secondary | ICD-10-CM | POA: Diagnosis not present

## 2020-04-23 DIAGNOSIS — J3081 Allergic rhinitis due to animal (cat) (dog) hair and dander: Secondary | ICD-10-CM | POA: Diagnosis not present

## 2020-04-30 DIAGNOSIS — J3089 Other allergic rhinitis: Secondary | ICD-10-CM | POA: Diagnosis not present

## 2020-04-30 DIAGNOSIS — J3081 Allergic rhinitis due to animal (cat) (dog) hair and dander: Secondary | ICD-10-CM | POA: Diagnosis not present

## 2020-04-30 DIAGNOSIS — J301 Allergic rhinitis due to pollen: Secondary | ICD-10-CM | POA: Diagnosis not present

## 2020-05-07 DIAGNOSIS — J3089 Other allergic rhinitis: Secondary | ICD-10-CM | POA: Diagnosis not present

## 2020-05-07 DIAGNOSIS — J301 Allergic rhinitis due to pollen: Secondary | ICD-10-CM | POA: Diagnosis not present

## 2020-05-07 DIAGNOSIS — J3081 Allergic rhinitis due to animal (cat) (dog) hair and dander: Secondary | ICD-10-CM | POA: Diagnosis not present

## 2020-05-14 DIAGNOSIS — J3081 Allergic rhinitis due to animal (cat) (dog) hair and dander: Secondary | ICD-10-CM | POA: Diagnosis not present

## 2020-05-14 DIAGNOSIS — J3089 Other allergic rhinitis: Secondary | ICD-10-CM | POA: Diagnosis not present

## 2020-05-14 DIAGNOSIS — J301 Allergic rhinitis due to pollen: Secondary | ICD-10-CM | POA: Diagnosis not present

## 2020-05-21 DIAGNOSIS — J301 Allergic rhinitis due to pollen: Secondary | ICD-10-CM | POA: Diagnosis not present

## 2020-05-21 DIAGNOSIS — J3089 Other allergic rhinitis: Secondary | ICD-10-CM | POA: Diagnosis not present

## 2020-05-21 DIAGNOSIS — J3081 Allergic rhinitis due to animal (cat) (dog) hair and dander: Secondary | ICD-10-CM | POA: Diagnosis not present

## 2020-05-23 ENCOUNTER — Encounter: Payer: Self-pay | Admitting: Dermatology

## 2020-05-23 ENCOUNTER — Other Ambulatory Visit: Payer: Self-pay

## 2020-05-23 ENCOUNTER — Ambulatory Visit (INDEPENDENT_AMBULATORY_CARE_PROVIDER_SITE_OTHER): Payer: Medicare Other | Admitting: Dermatology

## 2020-05-23 DIAGNOSIS — L570 Actinic keratosis: Secondary | ICD-10-CM | POA: Diagnosis not present

## 2020-05-23 DIAGNOSIS — Z1283 Encounter for screening for malignant neoplasm of skin: Secondary | ICD-10-CM | POA: Diagnosis not present

## 2020-05-23 DIAGNOSIS — L821 Other seborrheic keratosis: Secondary | ICD-10-CM

## 2020-05-23 DIAGNOSIS — I6523 Occlusion and stenosis of bilateral carotid arteries: Secondary | ICD-10-CM | POA: Diagnosis not present

## 2020-05-24 ENCOUNTER — Ambulatory Visit: Payer: Medicare Other

## 2020-05-24 DIAGNOSIS — I6523 Occlusion and stenosis of bilateral carotid arteries: Secondary | ICD-10-CM | POA: Diagnosis not present

## 2020-05-28 DIAGNOSIS — J301 Allergic rhinitis due to pollen: Secondary | ICD-10-CM | POA: Diagnosis not present

## 2020-05-28 DIAGNOSIS — J3089 Other allergic rhinitis: Secondary | ICD-10-CM | POA: Diagnosis not present

## 2020-05-28 DIAGNOSIS — J3081 Allergic rhinitis due to animal (cat) (dog) hair and dander: Secondary | ICD-10-CM | POA: Diagnosis not present

## 2020-05-30 NOTE — Progress Notes (Signed)
Moderate disease on the right and mild disease on the left carotid, will recheck in 6 months.  No significant change from previous.

## 2020-06-03 ENCOUNTER — Encounter: Payer: Self-pay | Admitting: Dermatology

## 2020-06-03 NOTE — Progress Notes (Signed)
   Follow-Up Visit   Subjective  Daniel Quinn is a 75 y.o. male who presents for the following: Follow-up (Recheck right shoulder- anterior- no concerns healing good).  Crusts on scalp, recheck areas of previous skin cancer. Location:  Duration:  Quality:  Associated Signs/Symptoms: Modifying Factors:  Severity:  Timing: Context:   Objective  Well appearing patient in no apparent distress; mood and affect are within normal limits. Objective  Mid Parietal Scalp, Right Temporal Scalp (2): Half dozen pink hornlike crusts on scalp  Objective  Right Upper Back: Millimeter textured light brown flattopped papule near hypopigmented scar (not contiguous).  Several other seborrheic keratoses on back.  Objective  Left Upper Back: All skin waist up examined, no new or recurrent nonmelanoma skin cancers, no atypical pigmented lesions.    All skin waist up examined.   Assessment & Plan    AK (actinic keratosis) (3) Mid Parietal Scalp; Right Temporal Scalp (2)  Destruction of lesion - Mid Parietal Scalp, Right Temporal Scalp Complexity: simple   Destruction method: cryotherapy   Informed consent: discussed and consent obtained   Timeout:  patient name, date of birth, surgical site, and procedure verified Lesion destroyed using liquid nitrogen: Yes   Cryotherapy cycles:  5 Outcome: patient tolerated procedure well with no complications   Post-procedure details: wound care instructions given    Seborrheic keratosis Right Upper Back  Leave if stable.  Encounter for screening for malignant neoplasm of skin Left Upper Back  Recheck annually plus as needed.      I, Lavonna Monarch, MD, have reviewed all documentation for this visit.  The documentation on 06/03/20 for the exam, diagnosis, procedures, and orders are all accurate and complete.

## 2020-06-04 DIAGNOSIS — J3081 Allergic rhinitis due to animal (cat) (dog) hair and dander: Secondary | ICD-10-CM | POA: Diagnosis not present

## 2020-06-04 DIAGNOSIS — J301 Allergic rhinitis due to pollen: Secondary | ICD-10-CM | POA: Diagnosis not present

## 2020-06-04 DIAGNOSIS — J3089 Other allergic rhinitis: Secondary | ICD-10-CM | POA: Diagnosis not present

## 2020-06-07 ENCOUNTER — Encounter: Payer: Self-pay | Admitting: Cardiology

## 2020-06-07 ENCOUNTER — Other Ambulatory Visit: Payer: Self-pay

## 2020-06-07 ENCOUNTER — Ambulatory Visit: Payer: Medicare Other | Admitting: Cardiology

## 2020-06-07 VITALS — BP 113/67 | HR 64 | Temp 97.6°F | Resp 17 | Ht 66.0 in | Wt 190.2 lb

## 2020-06-07 DIAGNOSIS — I2510 Atherosclerotic heart disease of native coronary artery without angina pectoris: Secondary | ICD-10-CM | POA: Diagnosis not present

## 2020-06-07 DIAGNOSIS — I10 Essential (primary) hypertension: Secondary | ICD-10-CM

## 2020-06-07 DIAGNOSIS — G9519 Other vascular myelopathies: Secondary | ICD-10-CM

## 2020-06-07 DIAGNOSIS — E7800 Pure hypercholesterolemia, unspecified: Secondary | ICD-10-CM

## 2020-06-07 DIAGNOSIS — I6523 Occlusion and stenosis of bilateral carotid arteries: Secondary | ICD-10-CM | POA: Diagnosis not present

## 2020-06-07 NOTE — Progress Notes (Signed)
Primary Physician/Referring:  Aura Dials, MD  Patient ID: Daniel Quinn, male    DOB: 1945-10-22, 74 y.o.   MRN: [id number]  Chief Complaint  Patient presents with  . Follow-up    6 month  . Coronary Artery Disease  . carotid disease  . Hyperlipidemia   HPI:    Daniel Quinn  is a 75 y.o. Caucasian male with history of stroke in 2016 for high-grade left vertebral artery stenosis, hypertension, hyperlipidemia, asymptomatic bilateral carotid artery stenosis, multivessel coronary artery disease with angioplasty to his right coronary artery on 02/05/2016 followed by staged intervention to LAD and proximal and mid circumflex coronary artery on 02/26/2016. This is annual visit.  He had not used any sublingual nitroglycerin. Denies chest pain, shortness of breath, leg edema, PND or orthopnea.  His main complaint over the past 2 to 3 months has been pain in his right thigh and also his right calf, has also mild chronic back pain.  His activity has reduced due to this pain.  Past Medical History:  Diagnosis Date  . Anxiety   . BCC (basal cell carcinoma of skin) 04/01/2014   bcc ulc. right forehead tx exc  . BCC (basal cell carcinoma of skin) 04/26/2015   bcc + marg reck 36mo . BCC (basal cell carcinoma of skin) 06/09/2016   bcc sup nod right mid back tx cx3 571f . BCC (basal cell carcinoma of skin) 01/14/2018   bcc nod. left nose tx MOHS  . BCC (basal cell carcinoma of skin) 12/19/2019   nod-right shoulder-anterior  . Coronary artery disease   . CVA (cerebral infarction) 07/01/14  . Depression   . Difficulty swallowing   . Environmental allergies   . GERD (gastroesophageal reflux disease)   . Hyperlipidemia   . Hypertension   . Hypothyroid   . Macrocytosis   . Polyarthritis    chronic  . Restless leg   . 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 SCC (squamous cell carcinoma) 01/14/2018   scc in situ left arm tx cx3 [id number].  Squamous cell carcinoma of skin 07/16/2019   in situ-right forearm posterior  ( watch, no treatment needed per JCB)  . [facility]   "right leg drags when I get tired; very little feeling of hot/cold on my RUE/RLE now" (02/05/2016)  . Swelling    right arm/hand, legs "since my stroke in 2015" (02/05/2016)   Past Surgical History:  Procedure Laterality Date  . CARDIAC CATHETERIZATION N/A 02/05/2016   Procedure: Left Heart Cath and Coronary Angiography;  Surgeon: JayAdrian ProwsD;  Location: [facility];  Service: Cardiovascular;  Laterality: N/A;  . CARDIAC CATHETERIZATION N/A 02/05/2016   Procedure: Coronary Stent Intervention;  Surgeon: JayAdrian ProwsD;  Location: [facility];  Service: Cardiovascular;  Laterality: N/A;  . CARDIAC CATHETERIZATION N/A 02/26/2016   Procedure: Coronary Stent Intervention;  Surgeon: JayAdrian ProwsD;  Location: [facility];  Service: Cardiovascular;  Laterality: N/A;  . CORONARY STENT PLACEMENT  02/26/2016   Successful atherectomy with CSI diamondback catheter followed by stenting with 3.0 x 18 mm onyx in the proximal LAD, stenosis reduced from 90% to 0%.  . NMarland KitchenSAL SEPTUM SURGERY  2000s   Social History   Tobacco Use  . Smoking status: Never Smoker  . Smokeless tobacco: Never Used  Substance Use Topics  . Alcohol use: Yes    Alcohol/week: 3.0  standard drinks    Types: 3 Cans of beer per week   Marital Status: Widowed   ROS  Review of Systems  Cardiovascular: Positive for claudication (right thigh and calf). Negative for chest pain, dyspnea on exertion and leg swelling.  Gastrointestinal: Negative for melena.   Objective  Blood pressure 113/67, pulse 64, temperature 97.6 F (36.4 C), temperature source Temporal, resp. rate 17, height '5\' 6"'  (1.676 m), weight 190 lb 3.2 oz (86.3 kg), SpO2 97 %.  Vitals with BMI 06/07/2020 12/08/2019 03/09/2019  Height '5\' 6"'  '5\' 6"'  '5\' 6"'   Weight 190 lbs 3 oz 189 lbs 190 lbs 13 oz  BMI 30.71 41.74 08.14   Systolic 481 856 314  Diastolic 67 62 70  Pulse 64 63 58     Physical Exam Cardiovascular:     Rate and Rhythm: Normal rate and regular rhythm.     Pulses: Normal pulses and intact distal pulses.          Carotid pulses are on the right side with bruit.    Heart sounds: Normal heart sounds. No murmur heard. No gallop.      Comments: No leg edema, no JVD. Pulmonary:     Effort: Pulmonary effort is normal.     Breath sounds: Normal breath sounds.  Abdominal:     General: Bowel sounds are normal.     Palpations: Abdomen is soft.  Skin:    Capillary Refill: Capillary refill takes less than 2 seconds.    Laboratory examination:   No results for input(s): NA, K, CL, CO2, GLUCOSE, BUN, CREATININE, CALCIUM, GFRNONAA, GFRAA in the last 8760 hours. CrCl cannot be calculated (Patient's most recent lab result is older than the maximum 21 days allowed.).  CMP Latest Ref Rng & Units 02/27/2016 02/06/2016 07/03/2014  Glucose 65 - 99 mg/dL 107(H) 122(H) 92  BUN 6 - 20 mg/dL 23(H) 16 18  Creatinine 0.61 - 1.24 mg/dL 1.26(H) 1.15 0.90  Sodium 135 - 145 mmol/L 138 138 135  Potassium 3.5 - 5.1 mmol/L 4.1 3.6 3.8  Chloride 101 - 111 mmol/L 106 104 102  CO2 22 - 32 mmol/L '24 24 30  ' Calcium 8.9 - 10.3 mg/dL 8.6(L) 8.9 8.4  Total Protein 6.0 - 8.3 g/dL - - -  Total Bilirubin 0.3 - 1.2 mg/dL - - -  Alkaline Phos 39 - 117 U/L - - -  AST 0 - 37 U/L - - -  ALT 0 - 53 U/L - - -   CBC Latest Ref Rng & Units 02/27/2016 02/06/2016 07/01/2014  WBC 4.0 - 10.5 K/uL 8.2 11.0(H) -  Hemoglobin 13.0 - 17.0 g/dL 11.8(L) 13.2 16.0  Hematocrit 39.0 - 52.0 % 34.8(L) 37.5(L) 47.0  Platelets 150 - 400 K/uL 194 214 -   Lipid Panel Recent Labs    03/06/20 1104  CHOL 140  TRIG 100  LDLCALC 79  HDL 42    HEMOGLOBIN A1C Lab Results  Component Value Date   HGBA1C 6.0 (H) 07/02/2014   MPG 126 07/02/2014   TSH No results for input(s): TSH in the last 8760 hours.  External labs:  Cholesterol, total 149.000  11/11/2019 HDL 45.000 11/11/2019 LDL-C 83.000 11/11/2019 Triglycerides 111.000 11/11/2019  A1C 6.400 07/18/2019 TSH 5.910 11/11/2019  Hemoglobin 14.100 11/11/2019  Creatinine, Serum 1.270 11/11/2019 Potassium 4.700 11/11/2019 ALT (SGPT) 20.000 11/11/2019   05/05/2017: Creatinine 1.23, EGFR 58/71, potassium 5.1, CMP normal. TSH 3.4. Free T4 normal. Cholesterol 150, triglycerides 148, HDL 39, LDL 81.  Medications  and allergies   Allergies  Allergen Reactions  . Brilinta [Ticagrelor]     Fever, elevated blood pressure, trembling    Current Outpatient Medications on File Prior to Visit  Medication Sig Dispense Refill  . acetaminophen (TYLENOL) 325 MG tablet Take 650 mg by mouth every 6 (six) hours as needed for mild pain or headache.    Marland Kitchen amLODipine (NORVASC) 10 MG tablet Take 1 tablet (10 mg total) by mouth daily. 30 tablet 1  . Ascorbic Acid (VITAMIN C) 1000 MG tablet Take 1,000 mg by mouth daily.    Marland Kitchen aspirin 81 MG chewable tablet Chew 81 mg by mouth daily.    Marland Kitchen atorvastatin (LIPITOR) 40 MG tablet Take 1 tablet (40 mg total) by mouth daily at 6 PM. 30 tablet 0  . azelastine (ASTELIN) 0.1 % nasal spray Place 2 sprays into both nostrils 2 (two) times daily. Use in each nostril as directed    . cetirizine (ZYRTEC) 10 MG tablet Take 1 tablet by mouth as needed.    . cyclobenzaprine (FLEXERIL) 10 MG tablet Take 1 tablet by mouth as needed.    . ezetimibe (ZETIA) 10 MG tablet Take 1 tablet (10 mg total) by mouth daily. 90 tablet 3  . famotidine (PEPCID) 20 MG tablet Take 20 mg by mouth 2 (two) times daily.    Marland Kitchen FLUoxetine (PROZAC) 20 MG capsule Take 20 mg by mouth daily.    . fluticasone (FLONASE) 50 MCG/ACT nasal spray Place 1 spray into both nostrils daily.    Marland Kitchen glipiZIDE (GLUCOTROL XL) 5 MG 24 hr tablet Take 5 mg by mouth daily.    Marland Kitchen levocetirizine (XYZAL) 5 MG tablet Take 5 mg by mouth daily.    Marland Kitchen levothyroxine (SYNTHROID, LEVOTHROID) 112 MCG tablet Take 112 mcg by mouth daily before  breakfast.    . losartan-hydrochlorothiazide (HYZAAR) 50-12.5 MG tablet Take 1 tablet by mouth daily.    . Melatonin 10 MG CAPS Take 1 tablet by mouth as needed.    . metoprolol succinate (TOPROL-XL) 25 MG 24 hr tablet TAKE 1 TABLET BY MOUTH EVERY DAY 90 tablet 2  . montelukast (SINGULAIR) 10 MG tablet Take 10 mg by mouth daily.    . Multiple Vitamin (MULTIVITAMIN WITH MINERALS) TABS tablet Take 1 tablet by mouth daily.    . nitroGLYCERIN (NITROSTAT) 0.4 MG SL tablet Place 1 tablet (0.4 mg total) under the tongue every 5 (five) minutes as needed for chest pain. 25 tablet 3  . omeprazole (PRILOSEC) 20 MG capsule Take 20 mg by mouth daily.     No current facility-administered medications on file prior to visit.    Radiology:  No results found.  Cardiac Studies:   Echocardiogram 01/31/2016: Left ventricle cavity is normal in size. Borderline decrease in global wall motion. Visual EF is 45-50%. Left atrial cavity is mildly dilated at 4.3 cm. Mild to moderate mitral regurgitation. Mild tricuspid regurgitation. No evidence of pulmonary hypertension.  Left Heart Catheterization 02/05/2016: 1. Normal LV systolic function, EF 01%. 2. High-grade proximal RCA, distal RCA 95% stenosis, S/P 3.0 x 18 mm and a 2.5 x 15 mm Xience Alpine DES, stenosis reduced to 0%. 3. Calcific LAD and Cx stenosis scheduled for staged PCI.  Left Heart Catheterization 12/0/2017: Successful atherectomy with CSI diamondback catheter followed by stenting 3.0 x 18 mm onyx in the proximal LAD.  Stenting of the proximal circumflex and mid circumflex coronary artery with overlapping 3.0 x 12 mm onyx and 3.0 x 15 mm Xience  Alpine DES.  Carotid artery duplex 05/24/2020: Stenosis in the right internal carotid artery (50-69%). Stenosis in the right external carotid artery (<50%). Stenosis in the left internal carotid artery (16-49%). Antegrade right vertebral artery flow. Antegrade left vertebral artery flow. Compared to the  study done on 11/09/2019, no change. Follow up in 6 months is appropriate if clinically indicated.  EKG:   EKG 06/07/2020: Sinus bradycardia at rate of 59 bpm, normal axis, no evidence of ischemia, normal EKG.  EKG 12/08/2019: Normal sinus rhythm with rate of 60 bpm, normal axis.  No evidence of ischemia, normal EKG.  No significant change from 01/07/2019.    Assessment     ICD-10-CM   1. Coronary artery disease involving native coronary artery of native heart without angina pectoris  I25.10   2. Primary hypertension  I10 EKG 12-Lead  3. Asymptomatic bilateral carotid artery stenosis  I65.23 PCV CAROTID DUPLEX (BILATERAL)  4. Hypercholesteremia  E78.00   5. Neurogenic claudication (HCC)  G95.19     No orders of the defined types were placed in this encounter.   Medications Discontinued During This Encounter  Medication Reason  . EPINEPHrine 0.3 mg/0.3 mL IJ SOAJ injection Error  . PROVENTIL HFA 108 (90 BASE) MCG/ACT inhaler Error  . Vitamins/Minerals TABS Error    Recommendations:   Daniel Quinn is a 75 y.o. Caucasian male with history of stroke in 2016 for high-grade left vertebral artery stenosis, hypertension, hyperlipidemia, asymptomatic bilateral carotid artery stenosis, multivessel coronary artery disease with angioplasty to his right coronary artery on 02/05/2016 followed by staged intervention to LAD and proximal and mid circumflex coronary artery on 02/26/2016.   Is presently on appropriate medical therapy, this is annual visit. Tolerating Zetia 10 mg daily, he has improved however still cannot do better, I am skeptical of using much higher dose of statins in view of his back pain and spinal stenosis, patient promises to lose 10 pounds by next office visit in 6 months.  Continue carotid artery surveillance every 6 months, his back pain and symptoms of claudication are related to spinal stenosis and he has had prior known DJD.  Requested him to follow-up with his orthopedic  physician.    Adrian Prows, MD, [facility] 06/07/2020, 2:53 PM Office: [phone]

## 2020-06-11 DIAGNOSIS — J3089 Other allergic rhinitis: Secondary | ICD-10-CM | POA: Diagnosis not present

## 2020-06-11 DIAGNOSIS — J3081 Allergic rhinitis due to animal (cat) (dog) hair and dander: Secondary | ICD-10-CM | POA: Diagnosis not present

## 2020-06-11 DIAGNOSIS — J301 Allergic rhinitis due to pollen: Secondary | ICD-10-CM | POA: Diagnosis not present

## 2020-06-18 DIAGNOSIS — J301 Allergic rhinitis due to pollen: Secondary | ICD-10-CM | POA: Diagnosis not present

## 2020-06-18 DIAGNOSIS — J3089 Other allergic rhinitis: Secondary | ICD-10-CM | POA: Diagnosis not present

## 2020-06-18 DIAGNOSIS — J3081 Allergic rhinitis due to animal (cat) (dog) hair and dander: Secondary | ICD-10-CM | POA: Diagnosis not present

## 2020-06-21 ENCOUNTER — Other Ambulatory Visit: Payer: Self-pay

## 2020-06-21 ENCOUNTER — Ambulatory Visit: Payer: Medicare Other

## 2020-06-21 ENCOUNTER — Other Ambulatory Visit: Payer: Self-pay | Admitting: Student

## 2020-06-21 DIAGNOSIS — I340 Nonrheumatic mitral (valve) insufficiency: Secondary | ICD-10-CM

## 2020-06-21 DIAGNOSIS — I6523 Occlusion and stenosis of bilateral carotid arteries: Secondary | ICD-10-CM

## 2020-06-25 DIAGNOSIS — J3089 Other allergic rhinitis: Secondary | ICD-10-CM | POA: Diagnosis not present

## 2020-06-25 DIAGNOSIS — J3081 Allergic rhinitis due to animal (cat) (dog) hair and dander: Secondary | ICD-10-CM | POA: Diagnosis not present

## 2020-06-25 DIAGNOSIS — J301 Allergic rhinitis due to pollen: Secondary | ICD-10-CM | POA: Diagnosis not present

## 2020-07-01 DIAGNOSIS — J301 Allergic rhinitis due to pollen: Secondary | ICD-10-CM | POA: Diagnosis not present

## 2020-07-01 DIAGNOSIS — J3081 Allergic rhinitis due to animal (cat) (dog) hair and dander: Secondary | ICD-10-CM | POA: Diagnosis not present

## 2020-07-01 DIAGNOSIS — J3089 Other allergic rhinitis: Secondary | ICD-10-CM | POA: Diagnosis not present

## 2020-07-02 DIAGNOSIS — J3089 Other allergic rhinitis: Secondary | ICD-10-CM | POA: Diagnosis not present

## 2020-07-02 DIAGNOSIS — J3081 Allergic rhinitis due to animal (cat) (dog) hair and dander: Secondary | ICD-10-CM | POA: Diagnosis not present

## 2020-07-02 DIAGNOSIS — J301 Allergic rhinitis due to pollen: Secondary | ICD-10-CM | POA: Diagnosis not present

## 2020-07-09 DIAGNOSIS — J301 Allergic rhinitis due to pollen: Secondary | ICD-10-CM | POA: Diagnosis not present

## 2020-07-09 DIAGNOSIS — J3081 Allergic rhinitis due to animal (cat) (dog) hair and dander: Secondary | ICD-10-CM | POA: Diagnosis not present

## 2020-07-09 DIAGNOSIS — J3089 Other allergic rhinitis: Secondary | ICD-10-CM | POA: Diagnosis not present

## 2020-07-16 DIAGNOSIS — J301 Allergic rhinitis due to pollen: Secondary | ICD-10-CM | POA: Diagnosis not present

## 2020-07-16 DIAGNOSIS — J3089 Other allergic rhinitis: Secondary | ICD-10-CM | POA: Diagnosis not present

## 2020-07-16 DIAGNOSIS — J3081 Allergic rhinitis due to animal (cat) (dog) hair and dander: Secondary | ICD-10-CM | POA: Diagnosis not present

## 2020-07-23 DIAGNOSIS — J301 Allergic rhinitis due to pollen: Secondary | ICD-10-CM | POA: Diagnosis not present

## 2020-08-13 DIAGNOSIS — J301 Allergic rhinitis due to pollen: Secondary | ICD-10-CM | POA: Diagnosis not present

## 2020-08-13 DIAGNOSIS — J3089 Other allergic rhinitis: Secondary | ICD-10-CM | POA: Diagnosis not present

## 2020-08-13 DIAGNOSIS — J3081 Allergic rhinitis due to animal (cat) (dog) hair and dander: Secondary | ICD-10-CM | POA: Diagnosis not present

## 2020-08-27 DIAGNOSIS — J301 Allergic rhinitis due to pollen: Secondary | ICD-10-CM | POA: Diagnosis not present

## 2020-08-27 DIAGNOSIS — J3089 Other allergic rhinitis: Secondary | ICD-10-CM | POA: Diagnosis not present

## 2020-08-27 DIAGNOSIS — J3081 Allergic rhinitis due to animal (cat) (dog) hair and dander: Secondary | ICD-10-CM | POA: Diagnosis not present

## 2020-09-03 DIAGNOSIS — J3081 Allergic rhinitis due to animal (cat) (dog) hair and dander: Secondary | ICD-10-CM | POA: Diagnosis not present

## 2020-09-03 DIAGNOSIS — J301 Allergic rhinitis due to pollen: Secondary | ICD-10-CM | POA: Diagnosis not present

## 2020-09-03 DIAGNOSIS — J3089 Other allergic rhinitis: Secondary | ICD-10-CM | POA: Diagnosis not present

## 2020-09-08 DIAGNOSIS — Z20822 Contact with and (suspected) exposure to covid-19: Secondary | ICD-10-CM | POA: Diagnosis not present

## 2020-09-11 DIAGNOSIS — J3081 Allergic rhinitis due to animal (cat) (dog) hair and dander: Secondary | ICD-10-CM | POA: Diagnosis not present

## 2020-09-11 DIAGNOSIS — J301 Allergic rhinitis due to pollen: Secondary | ICD-10-CM | POA: Diagnosis not present

## 2020-09-11 DIAGNOSIS — J3089 Other allergic rhinitis: Secondary | ICD-10-CM | POA: Diagnosis not present

## 2020-09-17 DIAGNOSIS — J3089 Other allergic rhinitis: Secondary | ICD-10-CM | POA: Diagnosis not present

## 2020-09-17 DIAGNOSIS — J3081 Allergic rhinitis due to animal (cat) (dog) hair and dander: Secondary | ICD-10-CM | POA: Diagnosis not present

## 2020-09-17 DIAGNOSIS — J301 Allergic rhinitis due to pollen: Secondary | ICD-10-CM | POA: Diagnosis not present

## 2020-09-20 DIAGNOSIS — J3081 Allergic rhinitis due to animal (cat) (dog) hair and dander: Secondary | ICD-10-CM | POA: Diagnosis not present

## 2020-09-20 DIAGNOSIS — J301 Allergic rhinitis due to pollen: Secondary | ICD-10-CM | POA: Diagnosis not present

## 2020-09-20 DIAGNOSIS — J3089 Other allergic rhinitis: Secondary | ICD-10-CM | POA: Diagnosis not present

## 2020-09-26 DIAGNOSIS — J301 Allergic rhinitis due to pollen: Secondary | ICD-10-CM | POA: Diagnosis not present

## 2020-09-26 DIAGNOSIS — J3081 Allergic rhinitis due to animal (cat) (dog) hair and dander: Secondary | ICD-10-CM | POA: Diagnosis not present

## 2020-09-26 DIAGNOSIS — J3089 Other allergic rhinitis: Secondary | ICD-10-CM | POA: Diagnosis not present

## 2020-09-28 DIAGNOSIS — J3081 Allergic rhinitis due to animal (cat) (dog) hair and dander: Secondary | ICD-10-CM | POA: Diagnosis not present

## 2020-09-28 DIAGNOSIS — J301 Allergic rhinitis due to pollen: Secondary | ICD-10-CM | POA: Diagnosis not present

## 2020-09-28 DIAGNOSIS — J3089 Other allergic rhinitis: Secondary | ICD-10-CM | POA: Diagnosis not present

## 2020-10-01 DIAGNOSIS — J3089 Other allergic rhinitis: Secondary | ICD-10-CM | POA: Diagnosis not present

## 2020-10-01 DIAGNOSIS — J301 Allergic rhinitis due to pollen: Secondary | ICD-10-CM | POA: Diagnosis not present

## 2020-10-03 ENCOUNTER — Other Ambulatory Visit: Payer: Self-pay | Admitting: Cardiology

## 2020-10-08 DIAGNOSIS — J301 Allergic rhinitis due to pollen: Secondary | ICD-10-CM | POA: Diagnosis not present

## 2020-10-08 DIAGNOSIS — J3089 Other allergic rhinitis: Secondary | ICD-10-CM | POA: Diagnosis not present

## 2020-10-08 DIAGNOSIS — J3081 Allergic rhinitis due to animal (cat) (dog) hair and dander: Secondary | ICD-10-CM | POA: Diagnosis not present

## 2020-10-14 ENCOUNTER — Other Ambulatory Visit: Payer: Self-pay | Admitting: Cardiology

## 2020-10-14 DIAGNOSIS — E7800 Pure hypercholesterolemia, unspecified: Secondary | ICD-10-CM

## 2020-10-16 DIAGNOSIS — J3081 Allergic rhinitis due to animal (cat) (dog) hair and dander: Secondary | ICD-10-CM | POA: Diagnosis not present

## 2020-10-16 DIAGNOSIS — J301 Allergic rhinitis due to pollen: Secondary | ICD-10-CM | POA: Diagnosis not present

## 2020-10-16 DIAGNOSIS — J3089 Other allergic rhinitis: Secondary | ICD-10-CM | POA: Diagnosis not present

## 2020-10-22 DIAGNOSIS — J3089 Other allergic rhinitis: Secondary | ICD-10-CM | POA: Diagnosis not present

## 2020-10-22 DIAGNOSIS — J301 Allergic rhinitis due to pollen: Secondary | ICD-10-CM | POA: Diagnosis not present

## 2020-10-22 DIAGNOSIS — J3081 Allergic rhinitis due to animal (cat) (dog) hair and dander: Secondary | ICD-10-CM | POA: Diagnosis not present

## 2020-10-29 DIAGNOSIS — J301 Allergic rhinitis due to pollen: Secondary | ICD-10-CM | POA: Diagnosis not present

## 2020-10-29 DIAGNOSIS — J3081 Allergic rhinitis due to animal (cat) (dog) hair and dander: Secondary | ICD-10-CM | POA: Diagnosis not present

## 2020-10-29 DIAGNOSIS — J3089 Other allergic rhinitis: Secondary | ICD-10-CM | POA: Diagnosis not present

## 2020-11-05 DIAGNOSIS — J3081 Allergic rhinitis due to animal (cat) (dog) hair and dander: Secondary | ICD-10-CM | POA: Diagnosis not present

## 2020-11-05 DIAGNOSIS — J3089 Other allergic rhinitis: Secondary | ICD-10-CM | POA: Diagnosis not present

## 2020-11-05 DIAGNOSIS — J301 Allergic rhinitis due to pollen: Secondary | ICD-10-CM | POA: Diagnosis not present

## 2020-11-13 DIAGNOSIS — J301 Allergic rhinitis due to pollen: Secondary | ICD-10-CM | POA: Diagnosis not present

## 2020-11-13 DIAGNOSIS — J3089 Other allergic rhinitis: Secondary | ICD-10-CM | POA: Diagnosis not present

## 2020-11-13 DIAGNOSIS — J3081 Allergic rhinitis due to animal (cat) (dog) hair and dander: Secondary | ICD-10-CM | POA: Diagnosis not present

## 2020-11-20 DIAGNOSIS — J3081 Allergic rhinitis due to animal (cat) (dog) hair and dander: Secondary | ICD-10-CM | POA: Diagnosis not present

## 2020-11-20 DIAGNOSIS — J301 Allergic rhinitis due to pollen: Secondary | ICD-10-CM | POA: Diagnosis not present

## 2020-11-20 DIAGNOSIS — J3089 Other allergic rhinitis: Secondary | ICD-10-CM | POA: Diagnosis not present

## 2020-11-27 ENCOUNTER — Ambulatory Visit: Payer: Medicare Other

## 2020-11-27 ENCOUNTER — Other Ambulatory Visit: Payer: Self-pay

## 2020-11-27 DIAGNOSIS — J3089 Other allergic rhinitis: Secondary | ICD-10-CM | POA: Diagnosis not present

## 2020-11-27 DIAGNOSIS — I6523 Occlusion and stenosis of bilateral carotid arteries: Secondary | ICD-10-CM | POA: Diagnosis not present

## 2020-11-27 DIAGNOSIS — J301 Allergic rhinitis due to pollen: Secondary | ICD-10-CM | POA: Diagnosis not present

## 2020-11-27 DIAGNOSIS — J3081 Allergic rhinitis due to animal (cat) (dog) hair and dander: Secondary | ICD-10-CM | POA: Diagnosis not present

## 2020-12-03 DIAGNOSIS — J3081 Allergic rhinitis due to animal (cat) (dog) hair and dander: Secondary | ICD-10-CM | POA: Diagnosis not present

## 2020-12-03 DIAGNOSIS — J3089 Other allergic rhinitis: Secondary | ICD-10-CM | POA: Diagnosis not present

## 2020-12-03 DIAGNOSIS — J301 Allergic rhinitis due to pollen: Secondary | ICD-10-CM | POA: Diagnosis not present

## 2020-12-07 ENCOUNTER — Encounter: Payer: Self-pay | Admitting: Cardiology

## 2020-12-07 ENCOUNTER — Other Ambulatory Visit: Payer: Self-pay

## 2020-12-07 ENCOUNTER — Ambulatory Visit: Payer: Medicare Other | Admitting: Cardiology

## 2020-12-07 VITALS — BP 113/68 | HR 68 | Temp 98.4°F | Resp 16 | Ht 66.0 in | Wt 188.0 lb

## 2020-12-07 DIAGNOSIS — I2510 Atherosclerotic heart disease of native coronary artery without angina pectoris: Secondary | ICD-10-CM

## 2020-12-07 DIAGNOSIS — I10 Essential (primary) hypertension: Secondary | ICD-10-CM

## 2020-12-07 DIAGNOSIS — E7800 Pure hypercholesterolemia, unspecified: Secondary | ICD-10-CM

## 2020-12-07 DIAGNOSIS — I6523 Occlusion and stenosis of bilateral carotid arteries: Secondary | ICD-10-CM

## 2020-12-07 NOTE — Progress Notes (Signed)
Primary Physician/Referring:  Aura Dials, MD  Patient ID: Daniel Quinn, male    DOB: [birth year], 75 y.o.   MRN: [id number]  Chief Complaint  Patient presents with   Coronary artery disease involving native coronary artery of   Follow-up   Hypertension   HPI:    Daniel Quinn  is a 75 y.o. Caucasian male with history of stroke in 2016 for high-grade left vertebral artery stenosis, hypertension, hyperlipidemia, asymptomatic bilateral carotid artery stenosis, multivessel coronary artery disease with angioplasty to his right coronary artery on 02/05/2016 followed by staged intervention to LAD and proximal and mid circumflex coronary artery on 02/26/2016.  Patient presents for 76-monthfollow-up.  At last office visit he was stable from a cardiovascular standpoint, was encouraged to lose 10 pounds but otherwise no changes were made.  Unfortunately patient has continued to struggle with dietary compliance and his physical activity is limited secondary to musculoskeletal pain.  Patient has lost 2 pounds since last office visit.  Overall he is doing well from a cardiovascular standpoint without chest pain, dyspnea, leg edema, PND, orthopnea.  Denies dizziness, lightheadedness, syncope, near syncope.  He has not needed sublingual nitroglycerin.   Patient follows for management of knee pain and back pain with orthopedics.  Past Medical History:  Diagnosis Date   Anxiety    BCC (basal cell carcinoma of skin) 04/01/2014   bcc ulc. right forehead tx exc   BCC (basal cell carcinoma of skin) 04/26/2015   bcc + marg reck 285mo BCC (basal cell carcinoma of skin) 06/09/2016   bcc sup nod right mid back tx cx3 [id number] BCC (basal cell carcinoma of skin) 01/14/2018   bcc nod. left nose tx MOHS   BCC (basal cell carcinoma of skin) 12/19/2019   nod-right shoulder-anterior   Coronary artery disease    CVA (cerebral infarction) 07/01/14   Depression    Difficulty swallowing    Environmental allergies     GERD (gastroesophageal reflux disease)    Hyperlipidemia    Hypertension    Hypothyroid    Macrocytosis    Polyarthritis    chronic   Restless leg    SCC (squamous cell carcinoma) 07/10/2017   scc in situ  left outer cheek tx clear   SCC (squamous cell carcinoma) 01/14/2018   scc in situ left arm tx cx3 [id number] cell carcinoma of skin 07/16/2019   in situ-right forearm posterior  ( watch, no treatment needed per JCB)   [facility]   "right leg drags when I get tired; very little feeling of hot/cold on my RUE/RLE now" (02/05/2016)   Swelling    right arm/hand, legs "since my stroke in 2015" (02/05/2016)   Past Surgical History:  Procedure Laterality Date   CARDIAC CATHETERIZATION N/A 02/05/2016   Procedure: Left Heart Cath and Coronary Angiography;  Surgeon: Tameika Heckmann Adrian Prows;  [facility];  Service: Cardiovascular;  Laterality: N/A;   CARDIAC CATHETERIZATION N/A 02/05/2016   Procedure: Coronary Stent Intervention;  Surgeon: Simonne Boulos Adrian Prows;  [facility];  Service: Cardiovascular;  Laterality: N/A;   CARDIAC CATHETERIZATION N/A 02/26/2016   Procedure: Coronary Stent Intervention;  Surgeon: Aniesa Boback Adrian Prows;  [facility];  Service: Cardiovascular;  Laterality: N/A;   CORONARY STENT PLACEMENT  02/26/2016   Successful atherectomy with CSI diamondback catheter followed by stenting with 3.0 x 18 mm onyx in the proximal LAD, stenosis reduced from 90% to 0%.  NASAL SEPTUM SURGERY  2000s   Family History  Problem Relation Age of Onset   Stroke Mother    COPD Father    CAD Brother    Diabetes type II Neg Hx    Social History   Tobacco Use   Smoking status: Never   Smokeless tobacco: Never  Substance Use Topics   Alcohol use: Yes    Alcohol/week: 3.0 standard drinks    Types: 3 Cans of beer per week   Marital Status: Widowed   ROS  Review of Systems  Constitutional: Negative for malaise/fatigue and weight gain.   Cardiovascular:  Positive for claudication (right thigh and calf, improved). Negative for chest pain, dyspnea on exertion, leg swelling, near-syncope, orthopnea, palpitations, paroxysmal nocturnal dyspnea and syncope.  Respiratory:  Negative for shortness of breath.   Gastrointestinal:  Negative for melena.  Neurological:  Negative for dizziness.  Objective  Blood pressure 113/68, pulse 68, temperature 98.4 F (36.9 C), resp. rate 16, height '5\' 6"'  (1.676 m), weight 188 lb (85.3 kg), SpO2 96 %.  Vitals with BMI 12/07/2020 06/07/2020 12/08/2019  Height '5\' 6"'  '5\' 6"'  '5\' 6"'   Weight 188 lbs 190 lbs 3 oz 189 lbs  BMI 30.36 46.80 32.12  Systolic 248 250 037  Diastolic 68 67 62  Pulse 68 64 63     Physical Exam Vitals reviewed.  HENT:     Head: Normocephalic and atraumatic.  Cardiovascular:     Rate and Rhythm: Normal rate and regular rhythm.     Pulses: Normal pulses and intact distal pulses.          Carotid pulses are  on the right side with bruit.    Heart sounds: Normal heart sounds, S1 normal and S2 normal. No murmur heard.   No gallop.     Comments: No JVD. Pulmonary:     Effort: Pulmonary effort is normal. No respiratory distress.     Breath sounds: Normal breath sounds. No wheezing, rhonchi or rales.  Musculoskeletal:     Right lower leg: No edema.     Left lower leg: No edema.  Neurological:     Mental Status: He is alert.   Laboratory examination:   CMP Latest Ref Rng & Units 02/27/2016 02/06/2016 07/03/2014  Glucose 65 - 99 mg/dL 107(H) 122(H) 92  BUN 6 - 20 mg/dL 23(H) 16 18  Creatinine 0.61 - 1.24 mg/dL 1.26(H) 1.15 0.90  Sodium 135 - 145 mmol/L 138 138 135  Potassium 3.5 - 5.1 mmol/L 4.1 3.6 3.8  Chloride 101 - 111 mmol/L 106 104 102  CO2 22 - 32 mmol/L '24 24 30  ' Calcium 8.9 - 10.3 mg/dL 8.6(L) 8.9 8.4  Total Protein 6.0 - 8.3 g/dL - - -  Total Bilirubin 0.3 - 1.2 mg/dL - - -  Alkaline Phos 39 - 117 U/L - - -  AST 0 - 37 U/L - - -  ALT 0 - 53 U/L - - -   CBC  Latest Ref Rng & Units 02/27/2016 02/06/2016 07/01/2014  WBC 4.0 - 10.5 K/uL 8.2 11.0(H) -  Hemoglobin 13.0 - 17.0 g/dL 11.8(L) 13.2 16.0  Hematocrit 39.0 - 52.0 % 34.8(L) 37.5(L) 47.0  Platelets 150 - 400 K/uL 194 214 -   Lipid Panel     Component Value Date/Time   CHOL 140 03/06/2020 1104   TRIG 100 03/06/2020 1104   HDL 42 03/06/2020 1104   CHOLHDL 4.8 07/02/2014 0658   VLDL 32 07/02/2014 0658   LDLCALC  79 03/06/2020 1104   HEMOGLOBIN A1C Lab Results  Component Value Date   HGBA1C 6.0 (H) 07/02/2014   MPG 126 07/02/2014   TSH No results for input(s): TSH in the last 8760 hours.  External labs:  Cholesterol, total 149.000 11/11/2019 HDL 45.000 11/11/2019 LDL-C 83.000 11/11/2019 Triglycerides 111.000 11/11/2019  A1C 6.400 07/18/2019 TSH 5.910 11/11/2019  Hemoglobin 14.100 11/11/2019  Creatinine, Serum 1.270 11/11/2019 Potassium 4.700 11/11/2019 ALT (SGPT) 20.000 11/11/2019   05/05/2017: Creatinine 1.23, EGFR 58/71, potassium 5.1, CMP normal. TSH 3.4. Free T4 normal. Cholesterol 150, triglycerides 148, HDL 39, LDL 81. Allergies   Allergies  Allergen Reactions   Brilinta [Ticagrelor]     Fever, elevated blood pressure, trembling    Medications Prior to Visit:   Outpatient Medications Prior to Visit  Medication Sig Dispense Refill   acetaminophen (TYLENOL) 325 MG tablet Take 650 mg by mouth every 6 (six) hours as needed for mild pain or headache.     Ascorbic Acid (VITAMIN C) 1000 MG tablet Take 1,000 mg by mouth daily.     aspirin 81 MG chewable tablet Chew 81 mg by mouth daily.     atorvastatin (LIPITOR) 40 MG tablet Take 1 tablet (40 mg total) by mouth daily at 6 PM. 30 tablet 0   azelastine (ASTELIN) 0.1 % nasal spray Place 2 sprays into both nostrils 2 (two) times daily. Use in each nostril as directed     cetirizine (ZYRTEC) 10 MG tablet Take 1 tablet by mouth as needed.     ezetimibe (ZETIA) 10 MG tablet TAKE 1 TABLET BY MOUTH EVERY DAY 90 tablet 3   famotidine  (PEPCID) 20 MG tablet Take 20 mg by mouth 2 (two) times daily.     FLUoxetine (PROZAC) 20 MG capsule Take 20 mg by mouth daily.     fluticasone (FLONASE) 50 MCG/ACT nasal spray Place 1 spray into both nostrils daily.     glipiZIDE (GLUCOTROL XL) 5 MG 24 hr tablet Take 5 mg by mouth daily.     levothyroxine (SYNTHROID, LEVOTHROID) 112 MCG tablet Take 112 mcg by mouth daily before breakfast.     losartan-hydrochlorothiazide (HYZAAR) 50-12.5 MG tablet Take 1 tablet by mouth daily.     Melatonin 10 MG CAPS Take 1 tablet by mouth as needed.     metoprolol succinate (TOPROL-XL) 25 MG 24 hr tablet TAKE 1 TABLET BY MOUTH EVERY DAY 90 tablet 2   montelukast (SINGULAIR) 10 MG tablet Take 10 mg by mouth daily.     Multiple Vitamin (MULTIVITAMIN WITH MINERALS) TABS tablet Take 1 tablet by mouth daily.     nitroGLYCERIN (NITROSTAT) 0.4 MG SL tablet Place 1 tablet (0.4 mg total) under the tongue every 5 (five) minutes as needed for chest pain. 25 tablet 3   amLODipine (NORVASC) 10 MG tablet Take 1 tablet (10 mg total) by mouth daily. 30 tablet 1   cyclobenzaprine (FLEXERIL) 10 MG tablet Take 1 tablet by mouth as needed.     levocetirizine (XYZAL) 5 MG tablet Take 5 mg by mouth daily.     omeprazole (PRILOSEC) 20 MG capsule Take 20 mg by mouth daily.     No facility-administered medications prior to visit.   Final Medications at End of Visit    Current Meds  Medication Sig   acetaminophen (TYLENOL) 325 MG tablet Take 650 mg by mouth every 6 (six) hours as needed for mild pain or headache.   Ascorbic Acid (VITAMIN C) 1000 MG tablet Take 1,000 mg  by mouth daily.   aspirin 81 MG chewable tablet Chew 81 mg by mouth daily.   atorvastatin (LIPITOR) 40 MG tablet Take 1 tablet (40 mg total) by mouth daily at 6 PM.   azelastine (ASTELIN) 0.1 % nasal spray Place 2 sprays into both nostrils 2 (two) times daily. Use in each nostril as directed   cetirizine (ZYRTEC) 10 MG tablet Take 1 tablet by mouth as needed.    ezetimibe (ZETIA) 10 MG tablet TAKE 1 TABLET BY MOUTH EVERY DAY   famotidine (PEPCID) 20 MG tablet Take 20 mg by mouth 2 (two) times daily.   FLUoxetine (PROZAC) 20 MG capsule Take 20 mg by mouth daily.   fluticasone (FLONASE) 50 MCG/ACT nasal spray Place 1 spray into both nostrils daily.   glipiZIDE (GLUCOTROL XL) 5 MG 24 hr tablet Take 5 mg by mouth daily.   levothyroxine (SYNTHROID, LEVOTHROID) 112 MCG tablet Take 112 mcg by mouth daily before breakfast.   losartan-hydrochlorothiazide (HYZAAR) 50-12.5 MG tablet Take 1 tablet by mouth daily.   Melatonin 10 MG CAPS Take 1 tablet by mouth as needed.   metoprolol succinate (TOPROL-XL) 25 MG 24 hr tablet TAKE 1 TABLET BY MOUTH EVERY DAY   montelukast (SINGULAIR) 10 MG tablet Take 10 mg by mouth daily.   Multiple Vitamin (MULTIVITAMIN WITH MINERALS) TABS tablet Take 1 tablet by mouth daily.   nitroGLYCERIN (NITROSTAT) 0.4 MG SL tablet Place 1 tablet (0.4 mg total) under the tongue every 5 (five) minutes as needed for chest pain.    Radiology:  No results found.  Cardiac Studies:   Left Heart Catheterization 02/05/2016: 1. Normal LV systolic function, EF 16%. 2. High-grade proximal RCA, distal RCA 95% stenosis, S/P 3.0 x 18 mm and a 2.5 x 15 mm Xience Alpine DES, stenosis reduced to 0%. 3. Calcific LAD and Cx stenosis scheduled for staged PCI.  Left Heart Catheterization 12/0/2017: Successful atherectomy with CSI diamondback catheter followed by stenting 3.0 x 18 mm onyx in the proximal LAD.  Stenting of the proximal circumflex and mid circumflex coronary artery with overlapping 3.0 x 12 mm onyx and 3.0 x 15 mm Xience Alpine DES.  PCV ECHOCARDIOGRAM COMPLETE 10/96/0454 Normal LV systolic function with visual EF 60-65%. Left ventricle cavity is normal in size. Normal global wall motion. Normal diastolic filling pattern, normal LAP. Mild (Grade I) mitral regurgitation. Mild tricuspid regurgitation. No evidence of pulmonary hypertension. Mild  pulmonic regurgitation. Compared to prior study dated 11/2015: LVEF was 45-50% and now 60-65%, mild PR is new, otherwise no significant change.    Carotid artery duplex 11/27/2020: Duplex suggests stenosis in the right internal carotid artery (16-49%). Duplex suggests stenosis in the right external carotid artery (<50%). Duplex suggests stenosis in the left internal carotid artery (50-69%). Duplex suggests stenosis in the left external carotid artery (<50%). Antegrade right vertebral artery flow. Antegrade left vertebral artery flow. No significant change since 05/24/2020. Follow up in six months is appropriate if clinically indicated.   EKG   12/07/2020: Sinus rhythm rate of 61 bpm.  Normal axis.  Nonspecific T wave abnormality.  06/07/2020: Sinus bradycardia at rate of 59 bpm, normal axis, no evidence of ischemia, normal EKG.    12/08/2019: Normal sinus rhythm with rate of 60 bpm, normal axis.  No evidence of ischemia, normal EKG.  No significant change from 01/07/2019.    Assessment     ICD-10-CM   1. Coronary artery disease involving native coronary artery of native heart without angina pectoris  I25.10  EKG 12-Lead    Lipid Panel With LDL/HDL Ratio    2. Asymptomatic bilateral carotid artery stenosis  I65.23 PCV CAROTID DUPLEX (BILATERAL)    3. Primary hypertension  I10     4. Hypercholesteremia  E78.00 Lipid Panel With LDL/HDL Ratio      No orders of the defined types were placed in this encounter.   Medications Discontinued During This Encounter  Medication Reason   levocetirizine (XYZAL) 5 MG tablet Error   omeprazole (PRILOSEC) 20 MG capsule Error   cyclobenzaprine (FLEXERIL) 10 MG tablet Error   amLODipine (NORVASC) 10 MG tablet Error    Recommendations:   Rob Mciver is a 75 y.o. Caucasian male with history of stroke in 2016 for high-grade left vertebral artery stenosis, hypertension, hyperlipidemia, asymptomatic bilateral carotid artery stenosis, multivessel  coronary artery disease with angioplasty to his right coronary artery on 02/05/2016 followed by staged intervention to LAD and proximal and mid circumflex coronary artery on 02/26/2016.   Patient presents for 39-monthfollow-up.  Reviewed and discussed results of carotid artery duplex, details above.  Carotid artery stenosis remains stable bilaterally, will repeat surveillance in 6 months.  Patient is tolerating guideline directed medical therapy including aspirin, atorvastatin, losartan, and metoprolol as well as statin.  However patient's LDL last checked remained >70.  Discussed with patient initiation of [place] inhibitor, which she is open to.  However we will first repeat lipid profile testing to evaluate the need for additional lipid management agent.  Patient's blood pressure is soft in the office today, however he is asymptomatic.  Will not make changes to medications at this time.  Will avoid using much higher dose of statins in view of his back pain and spinal stenosis.  Again encourage patient to focus on making diet and lifestyle modifications, particularly weight loss.  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  Follow-up in 6 months, sooner if needed, for CAD, carotid artery stenosis, hyperlipidemia, hypertension.   CAlethia Berthold PA-C 12/07/2020, 1:51 PM Office: [phone]

## 2020-12-10 DIAGNOSIS — I2510 Atherosclerotic heart disease of native coronary artery without angina pectoris: Secondary | ICD-10-CM | POA: Diagnosis not present

## 2020-12-10 DIAGNOSIS — E7800 Pure hypercholesterolemia, unspecified: Secondary | ICD-10-CM | POA: Diagnosis not present

## 2020-12-10 DIAGNOSIS — J3081 Allergic rhinitis due to animal (cat) (dog) hair and dander: Secondary | ICD-10-CM | POA: Diagnosis not present

## 2020-12-10 DIAGNOSIS — J301 Allergic rhinitis due to pollen: Secondary | ICD-10-CM | POA: Diagnosis not present

## 2020-12-10 DIAGNOSIS — J3089 Other allergic rhinitis: Secondary | ICD-10-CM | POA: Diagnosis not present

## 2020-12-11 LAB — LIPID PANEL WITH LDL/HDL RATIO
Cholesterol, Total: 125 mg/dL (ref 100–199)
HDL: 37 mg/dL — ABNORMAL LOW (ref >39)
LDL Chol Calc (NIH): 63 mg/dL (ref 0–99)
LDL/HDL Ratio: 1.7 ratio (ref 0.0–3.6)
Triglycerides: 142 mg/dL (ref 0–149)
VLDL Cholesterol Cal: 25 mg/dL (ref 5–40)

## 2020-12-17 DIAGNOSIS — J3089 Other allergic rhinitis: Secondary | ICD-10-CM | POA: Diagnosis not present

## 2020-12-17 DIAGNOSIS — J3081 Allergic rhinitis due to animal (cat) (dog) hair and dander: Secondary | ICD-10-CM | POA: Diagnosis not present

## 2020-12-17 DIAGNOSIS — J301 Allergic rhinitis due to pollen: Secondary | ICD-10-CM | POA: Diagnosis not present

## 2020-12-21 DIAGNOSIS — N1831 Chronic kidney disease, stage 3a: Secondary | ICD-10-CM | POA: Diagnosis not present

## 2020-12-21 DIAGNOSIS — R351 Nocturia: Secondary | ICD-10-CM | POA: Diagnosis not present

## 2020-12-21 DIAGNOSIS — E039 Hypothyroidism, unspecified: Secondary | ICD-10-CM | POA: Diagnosis not present

## 2020-12-21 DIAGNOSIS — I10 Essential (primary) hypertension: Secondary | ICD-10-CM | POA: Diagnosis not present

## 2020-12-21 DIAGNOSIS — Z0000 Encounter for general adult medical examination without abnormal findings: Secondary | ICD-10-CM | POA: Diagnosis not present

## 2020-12-21 DIAGNOSIS — I2510 Atherosclerotic heart disease of native coronary artery without angina pectoris: Secondary | ICD-10-CM | POA: Diagnosis not present

## 2020-12-21 DIAGNOSIS — D126 Benign neoplasm of colon, unspecified: Secondary | ICD-10-CM | POA: Diagnosis not present

## 2020-12-21 DIAGNOSIS — K2100 Gastro-esophageal reflux disease with esophagitis, without bleeding: Secondary | ICD-10-CM | POA: Diagnosis not present

## 2020-12-21 DIAGNOSIS — E785 Hyperlipidemia, unspecified: Secondary | ICD-10-CM | POA: Diagnosis not present

## 2020-12-21 DIAGNOSIS — K2270 Barrett's esophagus without dysplasia: Secondary | ICD-10-CM | POA: Diagnosis not present

## 2020-12-21 DIAGNOSIS — E119 Type 2 diabetes mellitus without complications: Secondary | ICD-10-CM | POA: Diagnosis not present

## 2020-12-21 DIAGNOSIS — I6523 Occlusion and stenosis of bilateral carotid arteries: Secondary | ICD-10-CM | POA: Diagnosis not present

## 2020-12-24 ENCOUNTER — Other Ambulatory Visit: Payer: Self-pay

## 2020-12-24 ENCOUNTER — Ambulatory Visit (INDEPENDENT_AMBULATORY_CARE_PROVIDER_SITE_OTHER): Payer: Medicare Other | Admitting: Dermatology

## 2020-12-24 ENCOUNTER — Encounter: Payer: Self-pay | Admitting: Dermatology

## 2020-12-24 DIAGNOSIS — L821 Other seborrheic keratosis: Secondary | ICD-10-CM | POA: Diagnosis not present

## 2020-12-24 DIAGNOSIS — J3089 Other allergic rhinitis: Secondary | ICD-10-CM | POA: Diagnosis not present

## 2020-12-24 DIAGNOSIS — J301 Allergic rhinitis due to pollen: Secondary | ICD-10-CM | POA: Diagnosis not present

## 2020-12-24 DIAGNOSIS — D485 Neoplasm of uncertain behavior of skin: Secondary | ICD-10-CM

## 2020-12-24 DIAGNOSIS — L570 Actinic keratosis: Secondary | ICD-10-CM | POA: Diagnosis not present

## 2020-12-24 DIAGNOSIS — J3081 Allergic rhinitis due to animal (cat) (dog) hair and dander: Secondary | ICD-10-CM | POA: Diagnosis not present

## 2020-12-24 DIAGNOSIS — Z85828 Personal history of other malignant neoplasm of skin: Secondary | ICD-10-CM

## 2020-12-24 MED ORDER — TOLAK 4 % EX CREA: 1.0000 "application " | TOPICAL_CREAM | Freq: Every evening | CUTANEOUS | 0 refills | Status: DC

## 2020-12-24 NOTE — Patient Instructions (Signed)
If you have not heard from [facility] by the end of the business day today then contact them tomorrow @ [phone].    Biopsy, Surgery (Curettage) & Surgery (Excision) Aftercare Instructions  1. Okay to remove bandage in 24 hours  2. Wash area with soap and water  3. Apply Vaseline to area twice daily until healed (Not Neosporin)  4. Okay to cover with a Band-Aid to decrease the chance of infection or prevent irritation from clothing; also it's okay to uncover lesion at home.  5. Suture instructions: return to our office in 7-10 or 10-14 days for a nurse visit for suture removal. Variable healing with sutures, if pain or itching occurs call our office. It's okay to shower or bathe 24 hours after sutures are given.  6. The following risks may occur after a biopsy, curettage or excision: bleeding, scarring, discoloration, recurrence, infection (redness, yellow drainage, pain or swelling).  7. For questions, concerns and results call our office at [place] before 4pm & Friday before 3pm. Biopsy results will be available in 1 week.

## 2020-12-31 DIAGNOSIS — J3081 Allergic rhinitis due to animal (cat) (dog) hair and dander: Secondary | ICD-10-CM | POA: Diagnosis not present

## 2020-12-31 DIAGNOSIS — J3089 Other allergic rhinitis: Secondary | ICD-10-CM | POA: Diagnosis not present

## 2020-12-31 DIAGNOSIS — J301 Allergic rhinitis due to pollen: Secondary | ICD-10-CM | POA: Diagnosis not present

## 2021-01-05 ENCOUNTER — Encounter: Payer: Self-pay | Admitting: Dermatology

## 2021-01-05 NOTE — Progress Notes (Signed)
   Follow-Up Visit   Subjective  Daniel Quinn is a 75 y.o. male who presents for the following: Follow-up (Follow up no new concerns).  Check previous surgical site and several new spots Location:  Duration:  Quality:  Associated Signs/Symptoms: Modifying Factors:  Severity:  Timing: Context:   Objective  Well appearing patient in no apparent distress; mood and affect are within normal limits. Right Mid Back Waxy pink 8 mm crust, rule out superficial carcinoma       Left Inferior Helix (4), Right Preauricular Area, Right Superior Helix Half dozen 2 to 3 mm gritty and hornlike crusts plus diffuse flatter AK's     Right Shoulder - Anterior No sign residual skin cancer    All skin waist up examined.   Assessment & Plan    Neoplasm of uncertain behavior of skin Right Mid Back  Skin / nail biopsy Type of biopsy: tangential   Informed consent: discussed and consent obtained   Timeout: patient name, date of birth, surgical site, and procedure verified   Procedure prep:  Patient was prepped and draped in usual sterile fashion (Non sterile) Prep type:  Chlorhexidine Anesthesia: the lesion was anesthetized in a standard fashion   Anesthetic:  1% lidocaine w/ epinephrine 1-100,000 local infiltration Instrument used: flexible razor blade   Hemostasis achieved with: ferric subsulfate   Outcome: patient tolerated procedure well   Post-procedure details: sterile dressing applied and wound care instructions given   Dressing type: bandage and petrolatum    Specimen 1 - Surgical pathology Differential Diagnosis: R/O BCC vs SCC  Check Margins: No  AK (actinic keratosis) (6) Right Superior Helix; Left Inferior Helix (4); Right Preauricular Area  We will freeze the thicker lesions and the patient will use Tol[place] in January on his face (goal 28 total applications; call me if too much irritation).  Destruction of lesion - Left Inferior Helix, Right Preauricular Area,  Right Superior Helix Complexity: simple   Destruction method: cryotherapy   Informed consent: discussed and consent obtained   Timeout:  patient name, date of birth, surgical site, and procedure verified Lesion destroyed using liquid nitrogen: Yes   Cryotherapy cycles:  3 Outcome: patient tolerated procedure well with no complications   Post-procedure details: wound care instructions given    Related Medications Fluorouracil (TOLAK) 4 % CREA Apply 1 application topically at bedtime.  Personal history of skin cancer Right Shoulder - Anterior  Check as needed change      I, Lavonna Monarch, MD, have reviewed all documentation for this visit.  The documentation on 01/05/21 for the exam, diagnosis, procedures, and orders are all accurate and complete.

## 2021-01-07 DIAGNOSIS — J3089 Other allergic rhinitis: Secondary | ICD-10-CM | POA: Diagnosis not present

## 2021-01-07 DIAGNOSIS — J301 Allergic rhinitis due to pollen: Secondary | ICD-10-CM | POA: Diagnosis not present

## 2021-01-07 DIAGNOSIS — J3081 Allergic rhinitis due to animal (cat) (dog) hair and dander: Secondary | ICD-10-CM | POA: Diagnosis not present

## 2021-01-14 DIAGNOSIS — J301 Allergic rhinitis due to pollen: Secondary | ICD-10-CM | POA: Diagnosis not present

## 2021-01-14 DIAGNOSIS — J3081 Allergic rhinitis due to animal (cat) (dog) hair and dander: Secondary | ICD-10-CM | POA: Diagnosis not present

## 2021-01-14 DIAGNOSIS — J3089 Other allergic rhinitis: Secondary | ICD-10-CM | POA: Diagnosis not present

## 2021-01-21 DIAGNOSIS — J301 Allergic rhinitis due to pollen: Secondary | ICD-10-CM | POA: Diagnosis not present

## 2021-01-21 DIAGNOSIS — J3081 Allergic rhinitis due to animal (cat) (dog) hair and dander: Secondary | ICD-10-CM | POA: Diagnosis not present

## 2021-01-21 DIAGNOSIS — J3089 Other allergic rhinitis: Secondary | ICD-10-CM | POA: Diagnosis not present

## 2021-01-22 DIAGNOSIS — N289 Disorder of kidney and ureter, unspecified: Secondary | ICD-10-CM | POA: Diagnosis not present

## 2021-01-23 DIAGNOSIS — J3089 Other allergic rhinitis: Secondary | ICD-10-CM | POA: Diagnosis not present

## 2021-01-23 DIAGNOSIS — J301 Allergic rhinitis due to pollen: Secondary | ICD-10-CM | POA: Diagnosis not present

## 2021-01-23 DIAGNOSIS — J3081 Allergic rhinitis due to animal (cat) (dog) hair and dander: Secondary | ICD-10-CM | POA: Diagnosis not present

## 2021-01-28 DIAGNOSIS — J3089 Other allergic rhinitis: Secondary | ICD-10-CM | POA: Diagnosis not present

## 2021-01-28 DIAGNOSIS — J301 Allergic rhinitis due to pollen: Secondary | ICD-10-CM | POA: Diagnosis not present

## 2021-01-28 DIAGNOSIS — J3081 Allergic rhinitis due to animal (cat) (dog) hair and dander: Secondary | ICD-10-CM | POA: Diagnosis not present

## 2021-02-04 DIAGNOSIS — J3089 Other allergic rhinitis: Secondary | ICD-10-CM | POA: Diagnosis not present

## 2021-02-04 DIAGNOSIS — J3081 Allergic rhinitis due to animal (cat) (dog) hair and dander: Secondary | ICD-10-CM | POA: Diagnosis not present

## 2021-02-04 DIAGNOSIS — J301 Allergic rhinitis due to pollen: Secondary | ICD-10-CM | POA: Diagnosis not present

## 2021-02-07 DIAGNOSIS — J3089 Other allergic rhinitis: Secondary | ICD-10-CM | POA: Diagnosis not present

## 2021-02-07 DIAGNOSIS — J301 Allergic rhinitis due to pollen: Secondary | ICD-10-CM | POA: Diagnosis not present

## 2021-02-07 DIAGNOSIS — J3081 Allergic rhinitis due to animal (cat) (dog) hair and dander: Secondary | ICD-10-CM | POA: Diagnosis not present

## 2021-02-11 DIAGNOSIS — J3081 Allergic rhinitis due to animal (cat) (dog) hair and dander: Secondary | ICD-10-CM | POA: Diagnosis not present

## 2021-02-11 DIAGNOSIS — J3089 Other allergic rhinitis: Secondary | ICD-10-CM | POA: Diagnosis not present

## 2021-02-11 DIAGNOSIS — J301 Allergic rhinitis due to pollen: Secondary | ICD-10-CM | POA: Diagnosis not present

## 2021-02-13 DIAGNOSIS — J3081 Allergic rhinitis due to animal (cat) (dog) hair and dander: Secondary | ICD-10-CM | POA: Diagnosis not present

## 2021-02-13 DIAGNOSIS — J3089 Other allergic rhinitis: Secondary | ICD-10-CM | POA: Diagnosis not present

## 2021-02-13 DIAGNOSIS — J301 Allergic rhinitis due to pollen: Secondary | ICD-10-CM | POA: Diagnosis not present

## 2021-02-18 DIAGNOSIS — J301 Allergic rhinitis due to pollen: Secondary | ICD-10-CM | POA: Diagnosis not present

## 2021-02-18 DIAGNOSIS — J3081 Allergic rhinitis due to animal (cat) (dog) hair and dander: Secondary | ICD-10-CM | POA: Diagnosis not present

## 2021-02-18 DIAGNOSIS — J3089 Other allergic rhinitis: Secondary | ICD-10-CM | POA: Diagnosis not present

## 2021-02-25 DIAGNOSIS — J3081 Allergic rhinitis due to animal (cat) (dog) hair and dander: Secondary | ICD-10-CM | POA: Diagnosis not present

## 2021-02-25 DIAGNOSIS — J3089 Other allergic rhinitis: Secondary | ICD-10-CM | POA: Diagnosis not present

## 2021-02-25 DIAGNOSIS — J301 Allergic rhinitis due to pollen: Secondary | ICD-10-CM | POA: Diagnosis not present

## 2021-03-04 DIAGNOSIS — J301 Allergic rhinitis due to pollen: Secondary | ICD-10-CM | POA: Diagnosis not present

## 2021-03-04 DIAGNOSIS — J3089 Other allergic rhinitis: Secondary | ICD-10-CM | POA: Diagnosis not present

## 2021-03-04 DIAGNOSIS — J3081 Allergic rhinitis due to animal (cat) (dog) hair and dander: Secondary | ICD-10-CM | POA: Diagnosis not present

## 2021-03-11 DIAGNOSIS — J301 Allergic rhinitis due to pollen: Secondary | ICD-10-CM | POA: Diagnosis not present

## 2021-03-11 DIAGNOSIS — J3089 Other allergic rhinitis: Secondary | ICD-10-CM | POA: Diagnosis not present

## 2021-03-12 DIAGNOSIS — M533 Sacrococcygeal disorders, not elsewhere classified: Secondary | ICD-10-CM | POA: Diagnosis not present

## 2021-03-19 DIAGNOSIS — M5442 Lumbago with sciatica, left side: Secondary | ICD-10-CM | POA: Diagnosis not present

## 2021-03-19 DIAGNOSIS — M9903 Segmental and somatic dysfunction of lumbar region: Secondary | ICD-10-CM | POA: Diagnosis not present

## 2021-03-19 DIAGNOSIS — J3081 Allergic rhinitis due to animal (cat) (dog) hair and dander: Secondary | ICD-10-CM | POA: Diagnosis not present

## 2021-03-19 DIAGNOSIS — J3089 Other allergic rhinitis: Secondary | ICD-10-CM | POA: Diagnosis not present

## 2021-03-19 DIAGNOSIS — M9902 Segmental and somatic dysfunction of thoracic region: Secondary | ICD-10-CM | POA: Diagnosis not present

## 2021-03-19 DIAGNOSIS — M9905 Segmental and somatic dysfunction of pelvic region: Secondary | ICD-10-CM | POA: Diagnosis not present

## 2021-03-19 DIAGNOSIS — J301 Allergic rhinitis due to pollen: Secondary | ICD-10-CM | POA: Diagnosis not present

## 2021-03-23 DIAGNOSIS — U071 COVID-19: Secondary | ICD-10-CM | POA: Diagnosis not present

## 2021-05-29 ENCOUNTER — Other Ambulatory Visit: Payer: Self-pay

## 2021-05-29 ENCOUNTER — Ambulatory Visit: Payer: 59

## 2021-05-29 DIAGNOSIS — I6523 Occlusion and stenosis of bilateral carotid arteries: Secondary | ICD-10-CM

## 2021-05-31 ENCOUNTER — Other Ambulatory Visit: Payer: Self-pay | Admitting: Cardiology

## 2021-06-05 NOTE — Progress Notes (Signed)
? ?Primary Physician/Referring:  Aura Dials, MD ? ?Patient ID: Daniel Quinn, male    DOB: [birth year], 76 y.o.   MRN: [id number] ? ?Chief Complaint  ?Patient presents with  ? Coronary Artery Disease  ? Hyperlipidemia  ? Carotid Stenosis  ? Follow-up  ?  6 months  ? ?HPI:   ? ?Daniel Quinn  is a 76 y.o. Caucasian male with history of stroke in 2016 for high-grade left vertebral artery stenosis, hypertension, hyperlipidemia, asymptomatic bilateral carotid artery stenosis, multivessel coronary artery disease with angioplasty to his right coronary artery on 02/05/2016 followed by staged intervention to LAD and proximal and mid circumflex coronary artery on 02/26/2016. ? ?Patient presents for 79-monthfollow-up.  Following last office visit obtain repeat lipid profile testing which revealed LDL was well controlled.  Carotid artery surveillance revealed mild regression of stenosis, will plan for 1 year follow-up.  Patient is feeling well overall and is asymptomatic from a cardiovascular standpoint.  He is currently biking 1 hour/day without issue.  He is not needed sublingual nitroglycerin.  Denies chest pain, dyspnea, dizziness, lightheadedness, syncope, near syncope.  His blood pressure is soft although we appears to tolerate this well. ? ?Past Medical History:  ?Diagnosis Date  ? Anxiety   ? BCC (basal cell carcinoma of skin) 04/01/2014  ? bcc ulc. right forehead tx exc  ? BCC (basal cell carcinoma of skin) 04/26/2015  ? bcc + marg reck 234mo? BCC (basal cell carcinoma of skin) 06/09/2016  ? bcc sup nod right mid back tx cx3 [id number]? BCC (basal cell carcinoma of skin) 01/14/2018  ? bcc nod. left nose tx MOHS  ? BCC (basal cell carcinoma of skin) 12/19/2019  ? nod-right shoulder-anterior  ? Coronary artery disease   ? CVA (cerebral infarction) 07/01/14  ? Depression   ? Difficulty swallowing   ? Environmental allergies   ? GERD (gastroesophageal reflux disease)   ? Hyperlipidemia   ? Hypertension   ? Hypothyroid   ?  Macrocytosis   ? Polyarthritis   ? chronic  ? Restless leg   ? SCC (squamous cell carcinoma) 07/10/2017  ? scc in situ  left outer cheek tx clear  ? SCC (squamous cell carcinoma) 01/14/2018  ? scc in situ left arm tx cx3 [id number] Squamous cell carcinoma of skin 07/16/2019  ? in situ-right forearm posterior  ( watch, no treatment needed per JCB)  ? [facility]  ? "right leg drags when I get tired; very little feeling of hot/cold on my RUE/RLE now" (02/05/2016)  ? Swelling   ? right arm/hand, legs "since my stroke in 2015" (02/05/2016)  ? ?Past Surgical History:  ?Procedure Laterality Date  ? CARDIAC CATHETERIZATION N/A 02/05/2016  ? Procedure: Left Heart Cath and Coronary Angiography;  Surgeon: Jay Adrian Prows;  [facility];  Service: Cardiovascular;  Laterality: N/A;  ? CARDIAC CATHETERIZATION N/A 02/05/2016  ? Procedure: Coronary Stent Intervention;  Surgeon: Jay Adrian Prows;  [facility];  Service: Cardiovascular;  Laterality: N/A;  ? CARDIAC CATHETERIZATION N/A 02/26/2016  ? Procedure: Coronary Stent Intervention;  Surgeon: Jay Adrian Prows;  [facility];  Service: Cardiovascular;  Laterality: N/A;  ? CORONARY STENT PLACEMENT  02/26/2016  ? Successful atherectomy with CSI diamondback catheter followed by stenting with 3.0 x 18 mm onyx in the proximal LAD, stenosis reduced from 90% to 0%.  ? NASAL SEPTUM SURGERY  2000s  ? ?Family History  ?Problem  Relation Age of Onset  ? Stroke Mother   ? COPD Father   ? CAD Brother   ? Diabetes type II Neg Hx   ? ?Social History  ? ?Tobacco Use  ? Smoking status: Never  ? Smokeless tobacco: Never  ?Substance Use Topics  ? Alcohol use: Yes  ?  Alcohol/week: 3.0 standard drinks  ?  Types: 3 Cans of beer per week  ?  Comment: occasionally  ? Marital Status: Widowed  ? ?ROS  ?Review of Systems  ?Constitutional: Negative for malaise/fatigue and weight gain.  ?Cardiovascular:  Negative for chest pain, claudication, dyspnea on exertion,  leg swelling, near-syncope, orthopnea, palpitations, paroxysmal nocturnal dyspnea and syncope.  ?Respiratory:  Negative for shortness of breath.   ?Gastrointestinal:  Negative for melena.  ?Neurological:  Negative for dizziness.  ?Objective  ?Blood pressure 109/68, pulse 72, temperature 98.6 ?F (37 ?C), temperature source Temporal, resp. rate 16, height '5\' 6"'  (1.676 m), weight 182 lb 9.6 oz (82.8 kg), SpO2 96 %.  ?Vitals with BMI 06/06/2021 12/07/2020 06/07/2020  ?Height '5\' 6"'  '5\' 6"'  '5\' 6"'   ?Weight 182 lbs 10 oz 188 lbs 190 lbs 3 oz  ?BMI 29.49 30.36 30.71  ?Systolic 497 530 051  ?Diastolic 68 68 67  ?Pulse 72 68 64  ?  ? Physical Exam ?Vitals reviewed.  ?HENT:  ?   Head: Normocephalic and atraumatic.  ?Cardiovascular:  ?   Rate and Rhythm: Normal rate and regular rhythm.  ?   Pulses: Normal pulses and intact distal pulses.     ?     Carotid pulses are  on the right side with bruit. ?   Heart sounds: Normal heart sounds, S1 normal and S2 normal. No murmur heard. ?  No gallop.  ?   Comments: No JVD. ?Pulmonary:  ?   Effort: Pulmonary effort is normal. No respiratory distress.  ?   Breath sounds: Normal breath sounds. No wheezing, rhonchi or rales.  ?Musculoskeletal:  ?   Right lower leg: No edema.  ?   Left lower leg: No edema.  ?Neurological:  ?   Mental Status: He is alert.  ?Physical unchanged compared to previous office visit. ? ?Laboratory examination:  ? ?CMP Latest Ref Rng & Units 02/27/2016 02/06/2016 07/03/2014  ?Glucose 65 - 99 mg/dL 107(H) 122(H) 92  ?BUN 6 - 20 mg/dL 23(H) 16 18  ?Creatinine 0.61 - 1.24 mg/dL 1.26(H) 1.15 0.90  ?Sodium 135 - 145 mmol/L 138 138 135  ?Potassium 3.5 - 5.1 mmol/L 4.1 3.6 3.8  ?Chloride 101 - 111 mmol/L 106 104 102  ?CO2 22 - 32 mmol/L '24 24 30  ' ?Calcium 8.9 - 10.3 mg/dL 8.6(L) 8.9 8.4  ?Total Protein 6.0 - 8.3 g/dL - - -  ?Total Bilirubin 0.3 - 1.2 mg/dL - - -  ?Alkaline Phos 39 - 117 U/L - - -  ?AST 0 - 37 U/L - - -  ?ALT 0 - 53 U/L - - -  ? ?CBC Latest Ref Rng & Units 02/27/2016  02/06/2016 07/01/2014  ?WBC 4.0 - 10.5 K/uL 8.2 11.0(H) -  ?Hemoglobin 13.0 - 17.0 g/dL 11.8(L) 13.2 16.0  ?Hematocrit 39.0 - 52.0 % 34.8(L) 37.5(L) 47.0  ?Platelets 150 - 400 K/uL 194 214 -  ? ?Lipid Panel  ?   ?Component Value Date/Time  ? CHOL 125 12/10/2020 1020  ? TRIG 142 12/10/2020 1020  ? HDL 37 (L) 12/10/2020 1020  ? CHOLHDL 4.8 07/02/2014 0658  ? VLDL 32 07/02/2014 0658  ?  [place] 63 12/10/2020 1020  ? ?HEMOGLOBIN A1C ?Lab Results  ?Component Value Date  ? HGBA1C 6.0 (H) 07/02/2014  ? MPG 126 07/02/2014  ? ?TSH ?No results for input(s): TSH in the last 8760 hours. ? ?External labs: ? ?Cholesterol, total 149.000 11/11/2019 ?HDL 45.000 11/11/2019 ?LDL-C 83.000 11/11/2019 ?Triglycerides 111.000 11/11/2019 ? ?A1C 6.400 07/18/2019 ?TSH 5.910 11/11/2019 ? ?Hemoglobin 14.100 11/11/2019 ? ?Creatinine, Serum 1.270 11/11/2019 ?Potassium 4.700 11/11/2019 ?ALT (SGPT) 20.000 11/11/2019 ? ? 05/05/2017: Creatinine 1.23, EGFR 58/71, potassium 5.1, CMP normal. TSH 3.4. Free T4 normal. Cholesterol 150, triglycerides 148, HDL 39, LDL 81. ?Allergies  ? ?Allergies  ?Allergen Reactions  ? Brilinta [Ticagrelor]   ?  Fever, elevated blood pressure, trembling  ?  ?Medications Prior to Visit:  ? ?Outpatient Medications Prior to Visit  ?Medication Sig Dispense Refill  ? acetaminophen (TYLENOL) 325 MG tablet Take 650 mg by mouth every 6 (six) hours as needed for mild pain or headache.    ? Ascorbic Acid (VITAMIN C) 1000 MG tablet Take 1,000 mg by mouth daily.    ? aspirin 81 MG chewable tablet Chew 81 mg by mouth daily.    ? atorvastatin (LIPITOR) 40 MG tablet Take 1 tablet (40 mg total) by mouth daily at 6 PM. 30 tablet 0  ? azelastine (ASTELIN) 0.1 % nasal spray Place 2 sprays into both nostrils 2 (two) times daily. Use in each nostril as directed    ? cetirizine (ZYRTEC) 10 MG tablet Take 1 tablet by mouth as needed.    ? ezetimibe (ZETIA) 10 MG tablet TAKE 1 TABLET BY MOUTH EVERY DAY 90 tablet 3  ? famotidine (PEPCID) 20 MG tablet Take 20 mg  by mouth 2 (two) times daily.    ? Fluorouracil (TOLAK) 4 % CREA Apply 1 application topically at bedtime. 40 g 0  ? FLUoxetine (PROZAC) 20 MG capsule Take 20 mg by mouth daily.    ? fluticasone (FLONASE)

## 2021-06-06 ENCOUNTER — Encounter: Payer: Self-pay | Admitting: Student

## 2021-06-06 ENCOUNTER — Ambulatory Visit: Payer: 59 | Admitting: Student

## 2021-06-06 ENCOUNTER — Other Ambulatory Visit: Payer: Self-pay

## 2021-06-06 VITALS — BP 109/68 | HR 72 | Temp 98.6°F | Resp 16 | Ht 66.0 in | Wt 182.6 lb

## 2021-10-28 ENCOUNTER — Other Ambulatory Visit: Payer: Self-pay | Admitting: Cardiology

## 2021-10-28 DIAGNOSIS — E7800 Pure hypercholesterolemia, unspecified: Secondary | ICD-10-CM

## 2021-12-06 LAB — GLUCOSE, POCT (MANUAL RESULT ENTRY): POC Glucose: 108 mg/dl — AB (ref 70–99)

## 2021-12-31 ENCOUNTER — Other Ambulatory Visit (HOSPITAL_BASED_OUTPATIENT_CLINIC_OR_DEPARTMENT_OTHER): Payer: Self-pay | Admitting: Physical Medicine and Rehabilitation

## 2021-12-31 DIAGNOSIS — M48062 Spinal stenosis, lumbar region with neurogenic claudication: Secondary | ICD-10-CM

## 2022-01-03 ENCOUNTER — Ambulatory Visit (HOSPITAL_BASED_OUTPATIENT_CLINIC_OR_DEPARTMENT_OTHER)
Admission: RE | Admit: 2022-01-03 | Discharge: 2022-01-03 | Disposition: A | Discharge status: Home / Self Care | Payer: Medicare HMO | Source: Ambulatory Visit | Attending: Physical Medicine and Rehabilitation | Admitting: Physical Medicine and Rehabilitation

## 2022-01-03 DIAGNOSIS — M5137 Other intervertebral disc degeneration, lumbosacral region: Secondary | ICD-10-CM | POA: Insufficient documentation

## 2022-01-03 DIAGNOSIS — M48062 Spinal stenosis, lumbar region with neurogenic claudication: Secondary | ICD-10-CM | POA: Diagnosis present

## 2022-01-03 DIAGNOSIS — M48061 Spinal stenosis, lumbar region without neurogenic claudication: Secondary | ICD-10-CM | POA: Diagnosis not present

## 2022-01-03 DIAGNOSIS — M5126 Other intervertebral disc displacement, lumbar region: Secondary | ICD-10-CM | POA: Insufficient documentation

## 2022-01-03 DIAGNOSIS — M4316 Spondylolisthesis, lumbar region: Secondary | ICD-10-CM | POA: Diagnosis not present

## 2022-01-03 DIAGNOSIS — M47819 Spondylosis without myelopathy or radiculopathy, site unspecified: Secondary | ICD-10-CM | POA: Diagnosis not present

## 2022-01-03 DIAGNOSIS — M5136 Other intervertebral disc degeneration, lumbar region: Secondary | ICD-10-CM | POA: Diagnosis not present

## 2022-02-27 ENCOUNTER — Other Ambulatory Visit: Payer: Self-pay | Admitting: Cardiology

## 2022-04-01 ENCOUNTER — Telehealth: Payer: Self-pay

## 2022-04-01 DIAGNOSIS — I2510 Atherosclerotic heart disease of native coronary artery without angina pectoris: Secondary | ICD-10-CM

## 2022-04-01 MED ORDER — NITROGLYCERIN 0.4 MG SL SUBL: 0.4000 mg | SUBLINGUAL_TABLET | SUBLINGUAL | 3 refills | Status: AC | PRN

## 2022-04-01 NOTE — Telephone Encounter (Signed)
Refilled nitro

## 2022-04-07 ENCOUNTER — Encounter: Payer: Self-pay | Admitting: Cardiology

## 2022-04-07 ENCOUNTER — Telehealth: Payer: Self-pay

## 2022-04-07 ENCOUNTER — Ambulatory Visit: Payer: Medicare HMO | Admitting: Cardiology

## 2022-04-07 VITALS — BP 145/79 | HR 64 | Resp 16 | Ht 66.0 in | Wt 180.4 lb

## 2022-04-07 DIAGNOSIS — E7800 Pure hypercholesterolemia, unspecified: Secondary | ICD-10-CM

## 2022-04-07 DIAGNOSIS — I25118 Atherosclerotic heart disease of native coronary artery with other forms of angina pectoris: Secondary | ICD-10-CM

## 2022-04-07 DIAGNOSIS — E119 Type 2 diabetes mellitus without complications: Secondary | ICD-10-CM

## 2022-04-07 DIAGNOSIS — I10 Essential (primary) hypertension: Secondary | ICD-10-CM

## 2022-04-07 MED ORDER — LOSARTAN POTASSIUM 25 MG PO TABS: 25.0000 mg | ORAL_TABLET | Freq: Every evening | ORAL | 3 refills | Status: DC

## 2022-04-07 MED ORDER — AMLODIPINE BESYLATE 5 MG PO TABS: 5.0000 mg | ORAL_TABLET | Freq: Every evening | ORAL | 2 refills | Status: DC

## 2022-04-07 NOTE — Telephone Encounter (Signed)
Patient calling with complaints of chest, shoulder, and arm tightness and pain. This has been going on for about a week on and off (at least once a day). Took Nitroglycerin one day last week, and it seemed to help hasn't taken anymore. Says when it happens he will sit down and it resolves in 3-4 minutes. Patient does not want to go to ED wants to wait for next available with [facility].

## 2022-04-07 NOTE — Progress Notes (Signed)
Primary Physician/Referring:  Aura Dials, MD  Patient ID: Daniel Quinn, male    DOB: [birth year], 77 y.o.   MRN: [id number]  Chief Complaint  Patient presents with   Chest Pain   HPI:    Daniel Quinn  is a 77 y.o. Caucasian male with history of stroke in 2016 for high-grade left vertebral artery stenosis, hypertension, hyperlipidemia, asymptomatic bilateral carotid artery stenosis, multivessel coronary artery disease with angioplasty to his right coronary artery on 02/05/2016 followed by staged intervention to LAD and proximal and mid circumflex coronary artery on 02/26/2016.  Patient called our office and made an appointment in view of chest pain suggestive of angina pectoris.  Patient states that for the past 2 weeks, he has had episodes of chest tightness with radiation to the arm and radiation to the neck similar to his prior angina for which he took sublingual nitroglycerin.  No rest pain, no dyspnea, no PND or orthopnea.  Past Medical History:  Diagnosis Date   Anxiety    BCC (basal cell carcinoma of skin) 04/01/2014   bcc ulc. right forehead tx exc   BCC (basal cell carcinoma of skin) 04/26/2015   bcc + marg reck 26mo  BCC (basal cell carcinoma of skin) 06/09/2016   bcc sup nod right mid back tx cx3 559f  BCC (basal cell carcinoma of skin) 01/14/2018   bcc nod. left nose tx MOHS   BCC (basal cell carcinoma of skin) 12/19/2019   nod-right shoulder-anterior   Coronary artery disease    CVA (cerebral infarction) 07/01/14   Depression    Difficulty swallowing    Environmental allergies    GERD (gastroesophageal reflux disease)    Hyperlipidemia    Hypertension    Hypothyroid    Macrocytosis    Polyarthritis    chronic   Restless leg    SCC (squamous cell carcinoma) 07/10/2017   scc in situ  left outer cheek tx clear   SCC (squamous cell carcinoma) 01/14/2018   scc in situ left arm tx cx3 [id number] Squamous cell carcinoma of skin 07/16/2019   in situ-right forearm  posterior  ( watch, no treatment needed per JCB)   [facility]   "right leg drags when I get tired; very little feeling of hot/cold on my RUE/RLE now" (02/05/2016)   Swelling    right arm/hand, legs "since my stroke in 2015" (02/05/2016)   Past Surgical History:  Procedure Laterality Date   CARDIAC CATHETERIZATION N/A 02/05/2016   Procedure: Left Heart Cath and Coronary Angiography;  Surgeon: JayAdrian ProwsD;  Location[facility] [place] [facility];  Service: Cardiovascular;  Laterality: N/A;   CARDIAC CATHETERIZATION N/A 02/05/2016   Procedure: Coronary Stent Intervention;  Surgeon: JayAdrian ProwsD;  Location: [facility] [place] [facility];  Service: Cardiovascular;  Laterality: N/A;   CARDIAC CATHETERIZATION N/A 02/26/2016   Procedure: Coronary Stent Intervention;  Surgeon: JayAdrian ProwsD;  Location: [facility] [place] [facility];  Service: Cardiovascular;  Laterality: N/A;   CORONARY STENT PLACEMENT  02/26/2016   Successful atherectomy with CSI diamondback catheter followed by stenting with 3.0 x 18 mm onyx in the proximal LAD, stenosis reduced from 90% to 0%.   NASAL SEPTUM SURGERY  2000s   Family History  Problem Relation Age of Onset   Stroke Mother    COPD Father    CAD Brother    Diabetes type II Neg Hx    Social History   Tobacco Use   Smoking status: Never  Smokeless tobacco: Never  Substance Use Topics   Alcohol use: Yes    Alcohol/week: 3.0 standard drinks of alcohol    Types: 3 Cans of beer per week    Comment: occasionally   Marital Status: Widowed   ROS  Review of Systems  Cardiovascular:  Positive for chest pain. Negative for dyspnea on exertion and leg swelling.   Objective  Blood pressure (!) 145/79, pulse 64, resp. rate 16, height '5\' 6"'$  (1.676 m), weight 180 lb 6.4 oz (81.8 kg), SpO2 97 %.     04/07/2022   11:33 AM 12/06/2021   10:54 AM 06/06/2021    1:15 PM  Vitals with BMI  Height '5\' 6"'$   '5\' 6"'$   Weight 180 lbs 6 oz  182 lbs 10 oz  BMI 62.83  15.17  Systolic 616 073 710   Diastolic 79 84 68  Pulse 64 74 72     Physical Exam Neck:     Vascular: No carotid bruit or JVD.  Cardiovascular:     Rate and Rhythm: Normal rate and regular rhythm.     Pulses: Intact distal pulses.     Heart sounds: Normal heart sounds. No murmur heard.    No gallop.  Pulmonary:     Effort: Pulmonary effort is normal.     Breath sounds: Normal breath sounds.  Abdominal:     General: Bowel sounds are normal.     Palpations: Abdomen is soft.  Musculoskeletal:     Right lower leg: No edema.     Left lower leg: No edema.    Laboratory examination:   External labs:  Cholesterol, total 132.000 m 01/01/2022 HDL 41.000 mg 01/01/2022 LDL 67.000 mg 01/01/2022 Triglycerides 135.000 m 01/01/2022  A1C 6.000 % 01/01/2022 TSH 2.140 01/01/2022  Hemoglobin 13.800 g/d 01/01/2022 Platelets 280.000 X1 12/21/2020  Creatinine, Serum 1.090 mg/ 01/01/2022 Potassium 4.600 mm 01/01/2022 Magnesium 2.300 MG/ 11/23/2019 ALT (SGPT) 24.000 U/L 01/01/2022   Allergies   Allergies  Allergen Reactions   Brilinta [Ticagrelor]     Fever, elevated blood pressure, trembling     Medications    Current Outpatient Medications:    acetaminophen (TYLENOL) 325 MG tablet, Take 650 mg by mouth every 6 (six) hours as needed for mild pain or headache., Disp: , Rfl:    amLODipine (NORVASC) 5 MG tablet, Take 1 tablet (5 mg total) by mouth every evening., Disp: 30 tablet, Rfl: 2   Ascorbic Acid (VITAMIN C) 1000 MG tablet, Take 1,000 mg by mouth daily., Disp: , Rfl:    aspirin 81 MG chewable tablet, Chew 81 mg by mouth daily., Disp: , Rfl:    atorvastatin (LIPITOR) 40 MG tablet, Take 1 tablet (40 mg total) by mouth daily at 6 PM., Disp: 30 tablet, Rfl: 0   azelastine (ASTELIN) 0.1 % nasal spray, Place 2 sprays into both nostrils 2 (two) times daily. Use in each nostril as directed, Disp: , Rfl:    cetirizine (ZYRTEC) 10 MG tablet, Take 1 tablet by mouth as needed., Disp: , Rfl:    ezetimibe (ZETIA) 10 MG  tablet, TAKE 1 TABLET BY MOUTH EVERY DAY, Disp: 90 tablet, Rfl: 3   famotidine (PEPCID) 20 MG tablet, Take 20 mg by mouth 2 (two) times daily., Disp: , Rfl:    Fluorouracil (TOLAK) 4 % CREA, Apply 1 application topically at bedtime., Disp: 40 g, Rfl: 0   FLUoxetine (PROZAC) 20 MG capsule, Take 20 mg by mouth daily., Disp: , Rfl:    fluticasone (FLONASE)  50 MCG/ACT nasal spray, Place 1 spray into both nostrils daily., Disp: , Rfl:    glipiZIDE (GLUCOTROL XL) 5 MG 24 hr tablet, Take 5 mg by mouth daily., Disp: , Rfl:    levothyroxine (SYNTHROID, LEVOTHROID) 112 MCG tablet, Take 112 mcg by mouth daily before breakfast., Disp: , Rfl:    losartan (COZAAR) 25 MG tablet, Take 1 tablet (25 mg total) by mouth every evening., Disp: 90 tablet, Rfl: 3   Melatonin 10 MG CAPS, Take 1 tablet by mouth as needed., Disp: , Rfl:    metoprolol succinate (TOPROL-XL) 25 MG 24 hr tablet, TAKE 1 TABLET BY MOUTH EVERY DAY (Patient taking differently: Take 25 mg by mouth daily.), Disp: 90 tablet, Rfl: 2   montelukast (SINGULAIR) 10 MG tablet, Take 10 mg by mouth daily., Disp: , Rfl:    Multiple Vitamin (MULTIVITAMIN WITH MINERALS) TABS tablet, Take 1 tablet by mouth daily., Disp: , Rfl:    nitroGLYCERIN (NITROSTAT) 0.4 MG SL tablet, Place 1 tablet (0.4 mg total) under the tongue every 5 (five) minutes as needed for chest pain., Disp: 25 tablet, Rfl: 3   Radiology:  No results found.  Cardiac Studies:   Left Heart Catheterization 02/05/2016: 1. Normal LV systolic function, EF 47%. 2. High-grade proximal RCA, distal RCA 95% stenosis, S/P 3.0 x 18 mm and a 2.5 x 15 mm Xience Alpine DES, stenosis reduced to 0%. 3. Calcific LAD and Cx stenosis scheduled for staged PCI.  Left Heart Catheterization 12/0/2017: Successful atherectomy with CSI diamondback catheter followed by stenting 3.0 x 18 mm onyx in the proximal LAD.  Stenting of the proximal circumflex and mid circumflex coronary artery with overlapping 3.0 x 12 mm onyx  and 3.0 x 15 mm Xience Alpine DES.   02/05/2016: 3.0 x 18 mm and a 2.5 x 15 mm Xience Alpine DES  12/0/2017: Atherectomy and 3.0 x 18 mm onyx in the proximal LAD. Proximal and mid Cx with overlapping 3.0 x 12 mm onyx and 3.0 x 15 mm Xience Alpine DES.  PCV ECHOCARDIOGRAM COMPLETE 65/46/5035 Normal LV systolic function with visual EF 60-65%. Left ventricle cavity is normal in size. Normal global wall motion. Normal diastolic filling pattern, normal LAP. Mild (Grade I) mitral regurgitation. Mild tricuspid regurgitation. No evidence of pulmonary hypertension. Mild pulmonic regurgitation. Compared to prior study dated 11/2015: LVEF was 45-50% and now 60-65%, mild PR is new, otherwise no significant change.  Carotid artery duplex 05/29/2021:  Duplex suggests stenosis in the right internal carotid artery (16-49%).  Duplex suggests stenosis in the right external carotid artery (<50%).  Duplex suggests stenosis in the left internal carotid artery (16-49%).  Antegrade left vertebral artery flow.  Compared to 11/27/2020, mild regression of the stenosis severity in the left ICA. Follow up in one year is appropriate if clinically indicated.   EKG   EKG 04/07/2022: Normal sinus rhythm at the rate of 66 bpm, normal EKG.  Compared to 06/06/2021, no change.    Assessment     ICD-10-CM   1. Coronary artery disease of native artery of native heart with stable angina pectoris (HCC)  I25.118 EKG 12-Lead    amLODipine (NORVASC) 5 MG tablet    losartan (COZAAR) 25 MG tablet    PCV MYOCARDIAL PERFUSION WO LEXISCAN    2. Asymptomatic bilateral carotid artery stenosis  I65.23     3. Primary hypertension  I10 amLODipine (NORVASC) 5 MG tablet    4. Hypercholesteremia  E78.00     5. Type 2  diabetes mellitus without complication, without long-term current use of insulin (HCC)  E11.9 losartan (COZAAR) 25 MG tablet      Meds ordered this encounter  Medications   amLODipine (NORVASC) 5 MG tablet    Sig:  Take 1 tablet (5 mg total) by mouth every evening.    Dispense:  30 tablet    Refill:  2   losartan (COZAAR) 25 MG tablet    Sig: Take 1 tablet (25 mg total) by mouth every evening.    Dispense:  90 tablet    Refill:  3    Medications Discontinued During This Encounter  Medication Reason   losartan-hydrochlorothiazide (HYZAAR) 50-12.5 MG tablet Patient has not taken in last 30 days     Recommendations:   Daniel Quinn is a 77 y.o. Caucasian male with history of stroke in 2016 for high-grade left vertebral artery stenosis, hypertension, hyperlipidemia, asymptomatic bilateral carotid artery stenosis, multivessel coronary artery disease with angioplasty to his right coronary artery on 02/05/2016 followed by staged intervention to LAD and proximal and mid circumflex coronary artery on 02/26/2016.    1. Coronary artery disease of native artery of native heart with stable angina pectoris [facility]) Patient called our office to make an appointment as he has no recurrence of angina pectoris, with chest tightness with radiation to his arm and also to his jaw after he had exercised and being physically active and sitting down.  Took sublingual nitroglycerin with relief.  I will add amlodipine 5 mg daily both for hypertension and for angina pectoris, continue metoprolol succinate 25 mg daily.  Schedule him for exercise nuclear stress test.  2. Primary hypertension Blood pressure is elevated today, previously was on losartan HCT 50/12.5 mg in the morning.  Patient has stopped this since last office visit with me almost 8 months ago.  In view of diabetes mellitus, and CAD, I would like him to be on ARB, losartan 25 mg in the evening started.  3. Hypercholesteremia External labs reviewed, lipids under excellent control with goal LDL <70.  4. Type 2 diabetes mellitus without complication, without long-term current use of insulin (HCC) His diabetes is well-controlled, no complications, we will start him on  losartan for cardiovascular protection and renal protection.  I will see him back in 6 weeks for follow-up.    Adrian Prows, PA-C 04/07/2022, 12:39 PM Office: [phone]

## 2022-04-07 NOTE — Patient Instructions (Addendum)
On the day of the stress test Please hold amlodipine and also metoprolol succinate

## 2022-04-14 ENCOUNTER — Ambulatory Visit: Payer: 59

## 2022-04-14 DIAGNOSIS — I25118 Atherosclerotic heart disease of native coronary artery with other forms of angina pectoris: Secondary | ICD-10-CM

## 2022-04-16 NOTE — Progress Notes (Signed)
Please bring this patient sooner than scheduled 05/19/22 and also get echo before he sees me. Let him know the stress test is abnormal and I would like to discuss in person.

## 2022-04-17 ENCOUNTER — Other Ambulatory Visit: Payer: Self-pay | Admitting: Cardiology

## 2022-04-17 DIAGNOSIS — I25118 Atherosclerotic heart disease of native coronary artery with other forms of angina pectoris: Secondary | ICD-10-CM

## 2022-04-18 ENCOUNTER — Ambulatory Visit: Payer: 59

## 2022-04-18 DIAGNOSIS — I25118 Atherosclerotic heart disease of native coronary artery with other forms of angina pectoris: Secondary | ICD-10-CM

## 2022-04-22 ENCOUNTER — Ambulatory Visit: Payer: Medicare HMO | Admitting: Cardiology

## 2022-04-22 ENCOUNTER — Encounter: Payer: Self-pay | Admitting: Cardiology

## 2022-04-22 VITALS — BP 124/66 | HR 60 | Resp 16 | Ht 66.0 in | Wt 186.0 lb

## 2022-04-22 DIAGNOSIS — I10 Essential (primary) hypertension: Secondary | ICD-10-CM

## 2022-04-22 DIAGNOSIS — I25118 Atherosclerotic heart disease of native coronary artery with other forms of angina pectoris: Secondary | ICD-10-CM

## 2022-04-22 DIAGNOSIS — E7800 Pure hypercholesterolemia, unspecified: Secondary | ICD-10-CM

## 2022-04-22 MED ORDER — AMLODIPINE BESYLATE 5 MG PO TABS: 5.0000 mg | ORAL_TABLET | Freq: Every evening | ORAL | 1 refills | Status: DC

## 2022-04-22 NOTE — H&P (View-Only) (Signed)
Primary Physician/Referring:  Aura Dials, MD  Patient ID: Daniel Quinn, male    DOB: [birth year], 77 y.o.   MRN: [id number]  Chief Complaint  Patient presents with   Coronary Artery Disease   Hypertension   Hyperlipidemia   Follow-up    6 week   HPI:    Daniel Quinn  is a 77 y.o. Caucasian male with history of stroke in 2016 for high-grade left vertebral artery stenosis, hypertension, hyperlipidemia, asymptomatic bilateral carotid artery stenosis, multivessel coronary artery disease with angioplasty to his right coronary artery on 02/05/2016 followed by staged intervention to LAD and proximal and mid circumflex coronary artery on 02/26/2016.  Patient called our office and made an appointment in view of chest pain suggestive of angina pectoris.  He was seen 4 to 6 weeks ago, I had added amlodipine and patient underwent echocardiogram and stress test and presents for follow-up.  He has noticed improvement in chest pain.  He has not used any sublingual nitroglycerin.   No rest pain, no dyspnea, no PND or orthopnea.  Past Medical History:  Diagnosis Date   Anxiety    BCC (basal cell carcinoma of skin) 04/01/2014   bcc ulc. right forehead tx exc   BCC (basal cell carcinoma of skin) 04/26/2015   bcc + marg reck 68mo  BCC (basal cell carcinoma of skin) 06/09/2016   bcc sup nod right mid back tx cx3 586f  BCC (basal cell carcinoma of skin) 01/14/2018   bcc nod. left nose tx MOHS   BCC (basal cell carcinoma of skin) 12/19/2019   nod-right shoulder-anterior   Coronary artery disease    CVA (cerebral infarction) 07/01/14   Depression    Difficulty swallowing    Environmental allergies    GERD (gastroesophageal reflux disease)    Hyperlipidemia    Hypertension    Hypothyroid    Macrocytosis    Polyarthritis    chronic   Restless leg    SCC (squamous cell carcinoma) 07/10/2017   scc in situ  left outer cheek tx clear   SCC (squamous cell carcinoma) 01/14/2018   scc in situ  left arm tx [id number] Squamous cell carcinoma of skin 07/16/2019   in situ-right forearm posterior  ( watch, no treatment needed per JCB)   [facility]   "right leg drags when I get tired; very little feeling of hot/cold on my RUE/RLE now" (02/05/2016)   Swelling    right arm/hand, legs "since my stroke in 2015" (02/05/2016)   Past Surgical History:  Procedure Laterality Date   CARDIAC CATHETERIZATION N/A 02/05/2016   Procedure: Left Heart Cath and Coronary Angiography;  Surgeon: JayAdrian ProwsD;  Location: [facility];  Service: Cardiovascular;  Laterality: N/A;   CARDIAC CATHETERIZATION N/A 02/05/2016   Procedure: Coronary Stent Intervention;  Surgeon: JayAdrian ProwsD;  Location: [facility];  Service: Cardiovascular;  Laterality: N/A;   CARDIAC CATHETERIZATION N/A 02/26/2016   Procedure: Coronary Stent Intervention;  Surgeon: JayAdrian ProwsD;  Location: [facility];  Service: Cardiovascular;  Laterality: N/A;   CORONARY STENT PLACEMENT  02/26/2016   Successful atherectomy with CSI diamondback catheter followed by stenting with 3.0 x 18 mm onyx in the proximal LAD, stenosis reduced from 90% to 0%.   NASAL SEPTUM SURGERY  2000s   Social History   Tobacco Use   Smoking status: Never   Smokeless tobacco: Never  Substance Use Topics   Alcohol use: Yes  Alcohol/week: 3.0 standard drinks of alcohol    Types: 3 Cans of beer per week    Comment: occasionally   Marital Status: Widowed   ROS  Review of Systems  Cardiovascular:  Positive for chest pain. Negative for dyspnea on exertion and leg swelling.   Objective  Blood pressure 124/66, pulse 60, resp. rate 16, height 5' 6"$  (1.676 m), weight 186 lb (84.4 kg), SpO2 94 %.     04/22/2022    1:37 PM 04/07/2022   11:33 AM 12/06/2021   10:54 AM  Vitals with BMI  Height 5' 6"$  5' 6"$    Weight 186 lbs 180 lbs 6 oz   BMI [id number]   Systolic [id number]  Diastolic 66 79 84  Pulse 60 64 74     Physical Exam Neck:      Vascular: No carotid bruit or JVD.  Cardiovascular:     Rate and Rhythm: Normal rate and regular rhythm.     Pulses: Intact distal pulses.     Heart sounds: Normal heart sounds. No murmur heard.    No gallop.  Pulmonary:     Effort: Pulmonary effort is normal.     Breath sounds: Normal breath sounds.  Abdominal:     General: Bowel sounds are normal.     Palpations: Abdomen is soft.  Musculoskeletal:     Right lower leg: No edema.     Left lower leg: No edema.    Laboratory examination:   External labs:  Cholesterol, total 132.000 m 01/01/2022 HDL 41.000 mg 01/01/2022 LDL 67.000 mg 01/01/2022 Triglycerides 135.000 m 01/01/2022  A1C 6.000 % 01/01/2022 TSH 2.140 01/01/2022  Hemoglobin 13.800 g/d 01/01/2022 Platelets 280.000 X1 12/21/2020  Creatinine, Serum 1.090 mg/ 01/01/2022 Potassium 4.600 mm 01/01/2022 Magnesium 2.300 MG/ 11/23/2019 ALT (SGPT) 24.000 U/L 01/01/2022   Allergies   Allergies  Allergen Reactions   Brilinta [Ticagrelor]     Fever, elevated blood pressure, trembling     Medications    Current Outpatient Medications:    Ascorbic Acid (VITAMIN C) 1000 MG tablet, Take 1,000 mg by mouth daily., Disp: , Rfl:    aspirin 81 MG chewable tablet, Chew 81 mg by mouth daily., Disp: , Rfl:    atorvastatin (LIPITOR) 40 MG tablet, Take 1 tablet (40 mg total) by mouth daily at 6 PM., Disp: 30 tablet, Rfl: 0   azelastine (ASTELIN) 0.1 % nasal spray, Place 2 sprays into both nostrils 2 (two) times daily. Use in each nostril as directed, Disp: , Rfl:    cetirizine (ZYRTEC) 10 MG tablet, Take 1 tablet by mouth as needed., Disp: , Rfl:    EPINEPHrine 0.3 mg/0.3 mL IJ SOAJ injection, Inject 0.3 mg into the muscle as needed., Disp: , Rfl:    ezetimibe (ZETIA) 10 MG tablet, TAKE 1 TABLET BY MOUTH EVERY DAY, Disp: 90 tablet, Rfl: 3   famotidine (PEPCID) 20 MG tablet, Take 20 mg by mouth 2 (two) times daily., Disp: , Rfl:    FLUoxetine (PROZAC) 20 MG capsule, Take 20 mg  by mouth daily., Disp: , Rfl:    fluticasone (FLONASE) 50 MCG/ACT nasal spray, Place 1 spray into both nostrils daily., Disp: , Rfl:    glipiZIDE (GLUCOTROL XL) 5 MG 24 hr tablet, Take 5 mg by mouth daily., Disp: , Rfl:    levothyroxine (SYNTHROID, LEVOTHROID) 112 MCG tablet, Take 112 mcg by mouth daily before breakfast., Disp: , Rfl:    losartan (COZAAR) 25 MG tablet, Take 1 tablet (25  mg total) by mouth every evening., Disp: 90 tablet, Rfl: 3   Melatonin 10 MG CAPS, Take 1 tablet by mouth as needed., Disp: , Rfl:    metoprolol succinate (TOPROL-XL) 25 MG 24 hr tablet, TAKE 1 TABLET BY MOUTH EVERY DAY (Patient taking differently: Take 25 mg by mouth daily.), Disp: 90 tablet, Rfl: 2   montelukast (SINGULAIR) 10 MG tablet, Take 10 mg by mouth daily., Disp: , Rfl:    Multiple Vitamin (MULTIVITAMIN WITH MINERALS) TABS tablet, Take 1 tablet by mouth daily., Disp: , Rfl:    nitroGLYCERIN (NITROSTAT) 0.4 MG SL tablet, Place 1 tablet (0.4 mg total) under the tongue every 5 (five) minutes as needed for chest pain., Disp: 25 tablet, Rfl: 3   amLODipine (NORVASC) 5 MG tablet, Take 1 tablet (5 mg total) by mouth every evening., Disp: 90 tablet, Rfl: 1   Fluorouracil (TOLAK) 4 % CREA, Apply 1 application topically at bedtime. (Patient not taking: Reported on 04/22/2022), Disp: 40 g, Rfl: 0   Radiology:  No results found.  Cardiac Studies:   Left Heart Catheterization 02/05/2016: 1. Normal LV systolic function, EF [id number]. 2. High-grade proximal RCA, distal RCA 95% stenosis, S/P 3.0 x 18 mm and a 2.5 x 15 mm Xience Alpine DES, stenosis reduced to 0%. 3. Calcific LAD and Cx stenosis scheduled for staged PCI.  Left Heart Catheterization 12/0/2017: Successful atherectomy with CSI diamondback catheter followed by stenting 3.0 x 18 mm onyx in the proximal LAD.  Stenting of the proximal circumflex and mid circumflex coronary artery with overlapping 3.0 x 12 mm onyx and 3.0 x 15 mm Xience Alpine DES.   02/05/2016:  RCA 3.0 x 18 mm and a 2.5 x 15 mm Xience Alpine DES  12/0/2017: Atherectomy and 3.0 x 18 mm onyx in the proximal LAD. Proximal and mid Cx with overlapping 3.0 x 12 mm onyx and 3.0 x 15 mm Xience Alpine DES.  Carotid artery duplex 05/29/2021:  Duplex suggests stenosis in the right internal carotid artery (16-49%).  Duplex suggests stenosis in the right external carotid artery (<50%).  Duplex suggests stenosis in the left internal carotid artery (16-49%).  Antegrade left vertebral artery flow.  Compared to 11/27/2020, mild regression of the stenosis severity in the left ICA. Follow up in one year is appropriate if clinically indicated.  PCV MYOCARDIAL PERFUSION WO LEXISCAN 04/14/2022  Narrative Lexiscan/modified Bruce Tetrofosmin stress test 04/16/2022: Lexiscan/modified Bruce nuclear stress test performed using 1-day protocol. Patient experienced chest tightness at heart rate of 75% MPHR. Stress EKG is non-diagnostic, as this is pharmacological stress test. In addition, stress EKG at 75% MPHR showed sinus tachycardia, 2.3 mm downsloping ST depression in leads V4-V6 that persisted beyond 2 min into recovery. SPECT images show medium sized, severe intensity, partially reversible perfusion defect in apical to basal inferior, basal inferoseptal, inferolateral myocardium, along with moderate decrease in global wall motion and thickening. Stress LVEF 40%. Intermediate risk study.   PCV ECHOCARDIOGRAM COMPLETE 04/18/2022  Narrative Echocardiogram 04/18/2022: Normal LV systolic function with visual EF 55-60%. Left ventricle cavity is normal in size. Normal left ventricular wall thickness. Normal global wall motion. Normal diastolic filling pattern, normal LAP. Calculated EF 56%. Structurally normal mitral valve.  Mild to moderate mitral regurgitation. Structurally normal tricuspid valve.  Mild tricuspid regurgitation. No evidence of pulmonary hypertension. Compared to 05/2020, MR has slightly  progressed.   EKG   EKG 04/07/2022: Normal sinus rhythm at the rate of 66 bpm, normal EKG.  Compared to 06/06/2021, no  change.    Assessment     ICD-10-CM   1. Coronary artery disease of native artery of native heart with stable angina pectoris (HCC)  I25.118 CBC    Basic metabolic panel    amLODipine (NORVASC) 5 MG tablet    2. Primary hypertension  I10 amLODipine (NORVASC) 5 MG tablet    3. Hypercholesteremia  E78.00       Meds ordered this encounter  Medications   amLODipine (NORVASC) 5 MG tablet    Sig: Take 1 tablet (5 mg total) by mouth every evening.    Dispense:  90 tablet    Refill:  1    Medications Discontinued During This Encounter  Medication Reason   acetaminophen (TYLENOL) 325 MG tablet Patient Preference   amLODipine (NORVASC) 5 MG tablet Reorder    Recommendations:   Daniel Quinn is a 77 y.o. Caucasian male with history of stroke in 2016 for high-grade left vertebral artery stenosis, hypertension, hyperlipidemia, asymptomatic bilateral carotid artery stenosis, multivessel coronary artery disease with angioplasty to his right coronary artery on 02/05/2016 followed by staged intervention to LAD and proximal and mid circumflex coronary artery on 02/26/2016.    1. Coronary artery disease of native artery of native heart with stable angina pectoris [facility]) Patient was seen by me about 6 to 8 weeks ago, he had recurrence of classic angina pectoris and had added amlodipine.  His symptoms improved significantly.  Blood pressure is also well-controlled.  I reviewed the results of the stress test, intermediate risk study with inferior ischemia, he has a very complex coronary stents, especially I am concerned about restenosis in the circumflex territory.  In view of multivessel disease, although intermediate risk stress test, clinically high risk for restenosis or progression of disease, I have recommended cardiac catheterization.  I also discussed regarding medical therapy as  his symptoms improved with continued amlodipine which is also help with blood pressure control.  Patient prefers to proceed with cardiac catheterization.  Schedule for cardiac catheterization, and possible angioplasty. We discussed regarding risks, benefits, alternatives to this including stress testing, CTA and continued medical therapy. Patient wants to proceed. Understands <1-2% risk of death, stroke, MI, urgent CABG, bleeding, infection, renal failure but not limited to these.   2. Primary hypertension Blood pressure is much improved and controlled, presently on appropriate medical therapy.  I will renew his amlodipine and sent for 90-day prescriptions.  3. Hypercholesteremia As dictated previously, lipids under excellent control.  Overall A1c is also well-controlled with well-controlled diabetes, all his risk factors are well-controlled and he is on guideline directed medical therapy with regard to stable coronary artery disease.  In view of persistent symptoms, we will proceed with cardiac catheterization.   I will see him back in 4 weeks for follow-up.    Adrian Prows, PA-C 04/22/2022, 2:14 PM Office: [phone]

## 2022-04-22 NOTE — Progress Notes (Signed)
 Primary Physician/Referring:  Thacker, Robert, MD  Patient ID: Daniel Quinn, male    DOB: 12/21/1945, 77 y.o.   MRN: [id number]  Chief Complaint  Patient presents with   Coronary Artery Disease   Hypertension   Hyperlipidemia   Follow-up    6 week   HPI:    Jasai Mahajan  is a 77 y.o. Caucasian male with history of stroke in 2016 for high-grade left vertebral artery stenosis, hypertension, hyperlipidemia, asymptomatic bilateral carotid artery stenosis, multivessel coronary artery disease with angioplasty to his right coronary artery on 02/05/2016 followed by staged intervention to LAD and proximal and mid circumflex coronary artery on 02/26/2016.  Patient called our office and made an appointment in view of chest pain suggestive of angina pectoris.  He was seen 4 to 6 weeks ago, I had added amlodipine and patient underwent echocardiogram and stress test and presents for follow-up.  He has noticed improvement in chest pain.  He has not used any sublingual nitroglycerin.   No rest pain, no dyspnea, no PND or orthopnea.  Past Medical History:  Diagnosis Date   Anxiety    BCC (basal cell carcinoma of skin) 04/01/2014   bcc ulc. right forehead tx exc   BCC (basal cell carcinoma of skin) 04/26/2015   bcc + marg reck 2mo   BCC (basal cell carcinoma of skin) 06/09/2016   bcc sup nod right mid back tx cx3 5fu   BCC (basal cell carcinoma of skin) 01/14/2018   bcc nod. left nose tx MOHS   BCC (basal cell carcinoma of skin) 12/19/2019   nod-right shoulder-anterior   Coronary artery disease    CVA (cerebral infarction) 07/01/14   Depression    Difficulty swallowing    Environmental allergies    GERD (gastroesophageal reflux disease)    Hyperlipidemia    Hypertension    Hypothyroid    Macrocytosis    Polyarthritis    chronic   Restless leg    SCC (squamous cell carcinoma) 07/10/2017   scc in situ  left outer cheek tx clear   SCC (squamous cell carcinoma) 01/14/2018   scc in situ  left arm tx cx3 5fu   Squamous cell carcinoma of skin 07/16/2019   in situ-right forearm posterior  ( watch, no treatment needed per JCB)   Stroke (HCC) 2015   "right leg drags when I get tired; very little feeling of hot/cold on my RUE/RLE now" (02/05/2016)   Swelling    right arm/hand, legs "since my stroke in 2015" (02/05/2016)   Past Surgical History:  Procedure Laterality Date   CARDIAC CATHETERIZATION N/A 02/05/2016   Procedure: Left Heart Cath and Coronary Angiography;  Surgeon: Dayshaun Whobrey, MD;  Location: [facility];  Service: Cardiovascular;  Laterality: N/A;   CARDIAC CATHETERIZATION N/A 02/05/2016   Procedure: Coronary Stent Intervention;  Surgeon: Taron Conrey, MD;  Location: [facility];  Service: Cardiovascular;  Laterality: N/A;   CARDIAC CATHETERIZATION N/A 02/26/2016   Procedure: Coronary Stent Intervention;  Surgeon: Quandarius Nill, MD;  Location: [facility];  Service: Cardiovascular;  Laterality: N/A;   CORONARY STENT PLACEMENT  02/26/2016   Successful atherectomy with CSI diamondback catheter followed by stenting with 3.0 x 18 mm onyx in the proximal LAD, stenosis reduced from 90% to 0%.   NASAL SEPTUM SURGERY  2000s   Social History   Tobacco Use   Smoking status: Never   Smokeless tobacco: Never  Substance Use Topics   Alcohol use: Yes      Alcohol/week: 3.0 standard drinks of alcohol    Types: 3 Cans of beer per week    Comment: occasionally   Marital Status: Widowed   ROS  Review of Systems  Cardiovascular:  Positive for chest pain. Negative for dyspnea on exertion and leg swelling.   Objective  Blood pressure 124/66, pulse 60, resp. rate 16, height 5' 6" (1.676 m), weight 186 lb (84.4 kg), SpO2 94 %.     04/22/2022    1:37 PM 04/07/2022   11:33 AM 12/06/2021   10:54 AM  Vitals with BMI  Height 5' 6" 5' 6"   Weight 186 lbs 180 lbs 6 oz   BMI 30.04 29.13   Systolic 124 145 151  Diastolic 66 79 84  Pulse 60 64 74     Physical Exam Neck:      Vascular: No carotid bruit or JVD.  Cardiovascular:     Rate and Rhythm: Normal rate and regular rhythm.     Pulses: Intact distal pulses.     Heart sounds: Normal heart sounds. No murmur heard.    No gallop.  Pulmonary:     Effort: Pulmonary effort is normal.     Breath sounds: Normal breath sounds.  Abdominal:     General: Bowel sounds are normal.     Palpations: Abdomen is soft.  Musculoskeletal:     Right lower leg: No edema.     Left lower leg: No edema.    Laboratory examination:   External labs:  Cholesterol, total 132.000 m 01/01/2022 HDL 41.000 mg 01/01/2022 LDL 67.000 mg 01/01/2022 Triglycerides 135.000 m 01/01/2022  A1C 6.000 % 01/01/2022 TSH 2.140 01/01/2022  Hemoglobin 13.800 g/d 01/01/2022 Platelets 280.000 X1 12/21/2020  Creatinine, Serum 1.090 mg/ 01/01/2022 Potassium 4.600 mm 01/01/2022 Magnesium 2.300 MG/ 11/23/2019 ALT (SGPT) 24.000 U/L 01/01/2022   Allergies   Allergies  Allergen Reactions   Brilinta [Ticagrelor]     Fever, elevated blood pressure, trembling     Medications    Current Outpatient Medications:    Ascorbic Acid (VITAMIN C) 1000 MG tablet, Take 1,000 mg by mouth daily., Disp: , Rfl:    aspirin 81 MG chewable tablet, Chew 81 mg by mouth daily., Disp: , Rfl:    atorvastatin (LIPITOR) 40 MG tablet, Take 1 tablet (40 mg total) by mouth daily at 6 PM., Disp: 30 tablet, Rfl: 0   azelastine (ASTELIN) 0.1 % nasal spray, Place 2 sprays into both nostrils 2 (two) times daily. Use in each nostril as directed, Disp: , Rfl:    cetirizine (ZYRTEC) 10 MG tablet, Take 1 tablet by mouth as needed., Disp: , Rfl:    EPINEPHrine 0.3 mg/0.3 mL IJ SOAJ injection, Inject 0.3 mg into the muscle as needed., Disp: , Rfl:    ezetimibe (ZETIA) 10 MG tablet, TAKE 1 TABLET BY MOUTH EVERY DAY, Disp: 90 tablet, Rfl: 3   famotidine (PEPCID) 20 MG tablet, Take 20 mg by mouth 2 (two) times daily., Disp: , Rfl:    FLUoxetine (PROZAC) 20 MG capsule, Take 20 mg  by mouth daily., Disp: , Rfl:    fluticasone (FLONASE) 50 MCG/ACT nasal spray, Place 1 spray into both nostrils daily., Disp: , Rfl:    glipiZIDE (GLUCOTROL XL) 5 MG 24 hr tablet, Take 5 mg by mouth daily., Disp: , Rfl:    levothyroxine (SYNTHROID, LEVOTHROID) 112 MCG tablet, Take 112 mcg by mouth daily before breakfast., Disp: , Rfl:    losartan (COZAAR) 25 MG tablet, Take 1 tablet (25   mg total) by mouth every evening., Disp: 90 tablet, Rfl: 3   Melatonin 10 MG CAPS, Take 1 tablet by mouth as needed., Disp: , Rfl:    metoprolol succinate (TOPROL-XL) 25 MG 24 hr tablet, TAKE 1 TABLET BY MOUTH EVERY DAY (Patient taking differently: Take 25 mg by mouth daily.), Disp: 90 tablet, Rfl: 2   montelukast (SINGULAIR) 10 MG tablet, Take 10 mg by mouth daily., Disp: , Rfl:    Multiple Vitamin (MULTIVITAMIN WITH MINERALS) TABS tablet, Take 1 tablet by mouth daily., Disp: , Rfl:    nitroGLYCERIN (NITROSTAT) 0.4 MG SL tablet, Place 1 tablet (0.4 mg total) under the tongue every 5 (five) minutes as needed for chest pain., Disp: 25 tablet, Rfl: 3   amLODipine (NORVASC) 5 MG tablet, Take 1 tablet (5 mg total) by mouth every evening., Disp: 90 tablet, Rfl: 1   Fluorouracil (TOLAK) 4 % CREA, Apply 1 application topically at bedtime. (Patient not taking: Reported on 04/22/2022), Disp: 40 g, Rfl: 0   Radiology:  No results found.  Cardiac Studies:   Left Heart Catheterization 02/05/2016: 1. Normal LV systolic function, EF 55%. 2. High-grade proximal RCA, distal RCA 95% stenosis, S/P 3.0 x 18 mm and a 2.5 x 15 mm Xience Alpine DES, stenosis reduced to 0%. 3. Calcific LAD and Cx stenosis scheduled for staged PCI.  Left Heart Catheterization 12/0/2017: Successful atherectomy with CSI diamondback catheter followed by stenting 3.0 x 18 mm onyx in the proximal LAD.  Stenting of the proximal circumflex and mid circumflex coronary artery with overlapping 3.0 x 12 mm onyx and 3.0 x 15 mm Xience Alpine DES.   02/05/2016:  RCA 3.0 x 18 mm and a 2.5 x 15 mm Xience Alpine DES  12/0/2017: Atherectomy and 3.0 x 18 mm onyx in the proximal LAD. Proximal and mid Cx with overlapping 3.0 x 12 mm onyx and 3.0 x 15 mm Xience Alpine DES.  Carotid artery duplex 05/29/2021:  Duplex suggests stenosis in the right internal carotid artery (16-49%).  Duplex suggests stenosis in the right external carotid artery (<50%).  Duplex suggests stenosis in the left internal carotid artery (16-49%).  Antegrade left vertebral artery flow.  Compared to 11/27/2020, mild regression of the stenosis severity in the left ICA. Follow up in one year is appropriate if clinically indicated.  PCV MYOCARDIAL PERFUSION WO LEXISCAN 04/14/2022  Narrative Lexiscan/modified Bruce Tetrofosmin stress test 04/16/2022: Lexiscan/modified Bruce nuclear stress test performed using 1-day protocol. Patient experienced chest tightness at heart rate of 75% MPHR. Stress EKG is non-diagnostic, as this is pharmacological stress test. In addition, stress EKG at 75% MPHR showed sinus tachycardia, 2.3 mm downsloping ST depression in leads V4-V6 that persisted beyond 2 min into recovery. SPECT images show medium sized, severe intensity, partially reversible perfusion defect in apical to basal inferior, basal inferoseptal, inferolateral myocardium, along with moderate decrease in global wall motion and thickening. Stress LVEF 40%. Intermediate risk study.   PCV ECHOCARDIOGRAM COMPLETE 04/18/2022  Narrative Echocardiogram 04/18/2022: Normal LV systolic function with visual EF 55-60%. Left ventricle cavity is normal in size. Normal left ventricular wall thickness. Normal global wall motion. Normal diastolic filling pattern, normal LAP. Calculated EF 56%. Structurally normal mitral valve.  Mild to moderate mitral regurgitation. Structurally normal tricuspid valve.  Mild tricuspid regurgitation. No evidence of pulmonary hypertension. Compared to 05/2020, MR has slightly  progressed.   EKG   EKG 04/07/2022: Normal sinus rhythm at the rate of 66 bpm, normal EKG.  Compared to 06/06/2021, no   change.    Assessment     ICD-10-CM   1. Coronary artery disease of native artery of native heart with stable angina pectoris (HCC)  I25.118 CBC    Basic metabolic panel    amLODipine (NORVASC) 5 MG tablet    2. Primary hypertension  I10 amLODipine (NORVASC) 5 MG tablet    3. Hypercholesteremia  E78.00       Meds ordered this encounter  Medications   amLODipine (NORVASC) 5 MG tablet    Sig: Take 1 tablet (5 mg total) by mouth every evening.    Dispense:  90 tablet    Refill:  1    Medications Discontinued During This Encounter  Medication Reason   acetaminophen (TYLENOL) 325 MG tablet Patient Preference   amLODipine (NORVASC) 5 MG tablet Reorder    Recommendations:   Jourden Mattera is a 77 y.o. Caucasian male with history of stroke in 2016 for high-grade left vertebral artery stenosis, hypertension, hyperlipidemia, asymptomatic bilateral carotid artery stenosis, multivessel coronary artery disease with angioplasty to his right coronary artery on 02/05/2016 followed by staged intervention to LAD and proximal and mid circumflex coronary artery on 02/26/2016.    1. Coronary artery disease of native artery of native heart with stable angina pectoris (HCC) Patient was seen by me about 6 to 8 weeks ago, he had recurrence of classic angina pectoris and had added amlodipine.  His symptoms improved significantly.  Blood pressure is also well-controlled.  I reviewed the results of the stress test, intermediate risk study with inferior ischemia, he has a very complex coronary stents, especially I am concerned about restenosis in the circumflex territory.  In view of multivessel disease, although intermediate risk stress test, clinically high risk for restenosis or progression of disease, I have recommended cardiac catheterization.  I also discussed regarding medical therapy as  his symptoms improved with continued amlodipine which is also help with blood pressure control.  Patient prefers to proceed with cardiac catheterization.  Schedule for cardiac catheterization, and possible angioplasty. We discussed regarding risks, benefits, alternatives to this including stress testing, CTA and continued medical therapy. Patient wants to proceed. Understands <1-2% risk of death, stroke, MI, urgent CABG, bleeding, infection, renal failure but not limited to these.   2. Primary hypertension Blood pressure is much improved and controlled, presently on appropriate medical therapy.  I will renew his amlodipine and sent for 90-day prescriptions.  3. Hypercholesteremia As dictated previously, lipids under excellent control.  Overall A1c is also well-controlled with well-controlled diabetes, all his risk factors are well-controlled and he is on guideline directed medical therapy with regard to stable coronary artery disease.  In view of persistent symptoms, we will proceed with cardiac catheterization.   I will see him back in 4 weeks for follow-up.    Nettye Flegal, PA-C 04/22/2022, 2:14 PM Office: [phone] 

## 2022-04-24 LAB — BASIC METABOLIC PANEL
BUN/Creatinine Ratio: 23 (ref 10–24)
BUN: 23 mg/dL (ref 8–27)
CO2: 25 mmol/L (ref 20–29)
Calcium: 9.4 mg/dL (ref 8.6–10.2)
Chloride: 99 mmol/L (ref 96–106)
Creatinine, Ser: 0.99 mg/dL (ref 0.76–1.27)
Glucose: 103 mg/dL — ABNORMAL HIGH (ref 70–99)
Potassium: 3.8 mmol/L (ref 3.5–5.2)
Sodium: 140 mmol/L (ref 134–144)
eGFR: 80 mL/min/{1.73_m2} (ref >59)

## 2022-04-24 LAB — CBC
Hematocrit: 42.7 % (ref 37.5–51.0)
Hemoglobin: 14.2 g/dL (ref 13.0–17.7)
MCH: 32 pg (ref 26.6–33.0)
MCHC: 33.3 g/dL (ref 31.5–35.7)
MCV: 96 fL (ref 79–97)
Platelets: 300 10*3/uL (ref 150–450)
RBC: 4.44 x10E6/uL (ref 4.14–5.80)
RDW: 12.1 % (ref 11.6–15.4)
WBC: 9.8 10*3/uL (ref 3.4–10.8)

## 2022-05-02 ENCOUNTER — Encounter (HOSPITAL_COMMUNITY): Payer: Self-pay | Admitting: Cardiology

## 2022-05-02 ENCOUNTER — Observation Stay (HOSPITAL_COMMUNITY)
Admission: RE | Admit: 2022-05-02 | Discharge: 2022-05-03 | Disposition: A | Discharge status: Home / Self Care | Payer: Medicare HMO | Attending: Cardiology | Admitting: Cardiology

## 2022-05-02 ENCOUNTER — Other Ambulatory Visit: Payer: Self-pay

## 2022-05-02 ENCOUNTER — Encounter (HOSPITAL_COMMUNITY)
Admission: RE | Disposition: A | Discharge status: Home / Self Care | Payer: Self-pay | Source: Home / Self Care | Attending: Cardiology

## 2022-05-02 ENCOUNTER — Other Ambulatory Visit (HOSPITAL_COMMUNITY): Payer: Self-pay

## 2022-05-02 DIAGNOSIS — Z8673 Personal history of transient ischemic attack (TIA), and cerebral infarction without residual deficits: Secondary | ICD-10-CM | POA: Diagnosis not present

## 2022-05-02 DIAGNOSIS — Z9861 Coronary angioplasty status: Secondary | ICD-10-CM

## 2022-05-02 DIAGNOSIS — Z85828 Personal history of other malignant neoplasm of skin: Secondary | ICD-10-CM | POA: Diagnosis not present

## 2022-05-02 DIAGNOSIS — E7800 Pure hypercholesterolemia, unspecified: Secondary | ICD-10-CM | POA: Insufficient documentation

## 2022-05-02 DIAGNOSIS — I25118 Atherosclerotic heart disease of native coronary artery with other forms of angina pectoris: Principal | ICD-10-CM

## 2022-05-02 DIAGNOSIS — E039 Hypothyroidism, unspecified: Secondary | ICD-10-CM | POA: Insufficient documentation

## 2022-05-02 DIAGNOSIS — Z955 Presence of coronary angioplasty implant and graft: Secondary | ICD-10-CM | POA: Insufficient documentation

## 2022-05-02 DIAGNOSIS — I10 Essential (primary) hypertension: Secondary | ICD-10-CM | POA: Diagnosis not present

## 2022-05-02 HISTORY — PX: CORONARY LITHOTRIPSY: CATH118330

## 2022-05-02 HISTORY — PX: CORONARY STENT INTERVENTION: CATH118234

## 2022-05-02 HISTORY — PX: LEFT HEART CATH AND CORONARY ANGIOGRAPHY: CATH118249

## 2022-05-02 LAB — POCT ACTIVATED CLOTTING TIME
Activated Clotting Time: 309 seconds
Activated Clotting Time: 271 seconds
Activated Clotting Time: 282 seconds

## 2022-05-02 SURGERY — LEFT HEART CATH AND CORONARY ANGIOGRAPHY
Anesthesia: LOCAL

## 2022-05-02 MED ORDER — SODIUM CHLORIDE 0.9 % WEIGHT BASED INFUSION: 3.0000 mL/kg/h | INTRAVENOUS | Status: DC

## 2022-05-02 MED ORDER — PANTOPRAZOLE SODIUM 40 MG PO TBEC
40.0000 mg | DELAYED_RELEASE_TABLET | Freq: Every day | ORAL | Status: DC
  Administered 2022-05-03: 40 mg via ORAL
  Filled 2022-05-02: qty 1

## 2022-05-02 MED ORDER — EZETIMIBE 10 MG PO TABS
10.0000 mg | ORAL_TABLET | Freq: Every evening | ORAL | Status: DC
  Administered 2022-05-02: 10 mg via ORAL
  Filled 2022-05-02: qty 1

## 2022-05-02 MED ORDER — ASPIRIN 81 MG PO CHEW: 81.0000 mg | CHEWABLE_TABLET | ORAL | Status: DC

## 2022-05-02 MED ORDER — NITROGLYCERIN 1 MG/10 ML FOR IR/CATH LAB
INTRA_ARTERIAL | Status: DC | PRN
  Administered 2022-05-02: 200 ug via INTRACORONARY

## 2022-05-02 MED ORDER — MELATONIN 5 MG PO TABS: 10.0000 mg | ORAL_TABLET | Freq: Every evening | ORAL | Status: DC | PRN

## 2022-05-02 MED ORDER — LIDOCAINE HCL (PF) 1 % IJ SOLN
INTRAMUSCULAR | Status: AC
  Filled 2022-05-02: qty 30

## 2022-05-02 MED ORDER — ASPIRIN 81 MG PO CHEW
81.0000 mg | CHEWABLE_TABLET | Freq: Every day | ORAL | Status: DC
  Administered 2022-05-03: 81 mg via ORAL
  Filled 2022-05-02: qty 1

## 2022-05-02 MED ORDER — CLOPIDOGREL BISULFATE 300 MG PO TABS
ORAL_TABLET | ORAL | Status: DC | PRN
  Administered 2022-05-02: 600 mg via ORAL

## 2022-05-02 MED ORDER — NITROGLYCERIN 1 MG/10 ML FOR IR/CATH LAB
INTRA_ARTERIAL | Status: AC
  Filled 2022-05-02: qty 10

## 2022-05-02 MED ORDER — CLOPIDOGREL BISULFATE 300 MG PO TABS
ORAL_TABLET | ORAL | Status: AC
  Filled 2022-05-02: qty 2

## 2022-05-02 MED ORDER — LEVOTHYROXINE SODIUM 112 MCG PO TABS
112.0000 ug | ORAL_TABLET | Freq: Every day | ORAL | Status: DC
  Administered 2022-05-03: 112 ug via ORAL
  Filled 2022-05-02: qty 1

## 2022-05-02 MED ORDER — ACETAMINOPHEN 325 MG PO TABS: 650.0000 mg | ORAL_TABLET | ORAL | Status: DC | PRN

## 2022-05-02 MED ORDER — SODIUM CHLORIDE 0.9 % WEIGHT BASED INFUSION
1.0000 mL/kg/h | INTRAVENOUS | Status: AC
  Administered 2022-05-02: 1 mL/kg/h via INTRAVENOUS

## 2022-05-02 MED ORDER — FAMOTIDINE IN NACL 20-0.9 MG/50ML-% IV SOLN
INTRAVENOUS | Status: AC
  Filled 2022-05-02: qty 50

## 2022-05-02 MED ORDER — ADULT MULTIVITAMIN W/MINERALS CH
1.0000 | ORAL_TABLET | Freq: Every day | ORAL | Status: DC
  Administered 2022-05-03: 1 via ORAL
  Filled 2022-05-02: qty 1

## 2022-05-02 MED ORDER — ONDANSETRON HCL 4 MG/2ML IJ SOLN: 4.0000 mg | Freq: Four times a day (QID) | INTRAMUSCULAR | Status: DC | PRN

## 2022-05-02 MED ORDER — VERAPAMIL HCL 2.5 MG/ML IV SOLN
INTRAVENOUS | Status: DC | PRN
  Administered 2022-05-02: 10 mL via INTRA_ARTERIAL

## 2022-05-02 MED ORDER — SODIUM CHLORIDE 0.9% FLUSH: 3.0000 mL | INTRAVENOUS | Status: DC | PRN

## 2022-05-02 MED ORDER — LIDOCAINE HCL (PF) 1 % IJ SOLN
INTRAMUSCULAR | Status: DC | PRN
  Administered 2022-05-02: 5 mL

## 2022-05-02 MED ORDER — FLUOXETINE HCL 20 MG PO CAPS
20.0000 mg | ORAL_CAPSULE | Freq: Every day | ORAL | Status: DC
  Administered 2022-05-03: 20 mg via ORAL
  Filled 2022-05-02: qty 1

## 2022-05-02 MED ORDER — CLOPIDOGREL BISULFATE 75 MG PO TABS
75.0000 mg | ORAL_TABLET | Freq: Every day | ORAL | 1 refills | Status: DC
  Filled 2022-05-02: qty 90, 90d supply, fill #0

## 2022-05-02 MED ORDER — HEPARIN (PORCINE) IN NACL 1000-0.9 UT/500ML-% IV SOLN
INTRAVENOUS | Status: DC | PRN
  Administered 2022-05-02 (×2): 500 mL

## 2022-05-02 MED ORDER — HEPARIN SODIUM (PORCINE) 1000 UNIT/ML IJ SOLN
INTRAMUSCULAR | Status: DC | PRN
  Administered 2022-05-02: 1000 [IU] via INTRAVENOUS
  Administered 2022-05-02 (×2): 4000 [IU] via INTRAVENOUS
  Administered 2022-05-02: 2000 [IU] via INTRAVENOUS

## 2022-05-02 MED ORDER — MIDAZOLAM HCL 2 MG/2ML IJ SOLN
INTRAMUSCULAR | Status: AC
  Filled 2022-05-02: qty 2

## 2022-05-02 MED ORDER — SODIUM CHLORIDE 0.9 % IV SOLN: 250.0000 mL | INTRAVENOUS | Status: DC | PRN

## 2022-05-02 MED ORDER — SODIUM CHLORIDE 0.9% FLUSH: 3.0000 mL | Freq: Two times a day (BID) | INTRAVENOUS | Status: DC

## 2022-05-02 MED ORDER — HEPARIN (PORCINE) IN NACL 1000-0.9 UT/500ML-% IV SOLN
INTRAVENOUS | Status: AC
  Filled 2022-05-02: qty 1000

## 2022-05-02 MED ORDER — PANTOPRAZOLE SODIUM 40 MG PO TBEC
40.0000 mg | DELAYED_RELEASE_TABLET | Freq: Every day | ORAL | 0 refills | Status: AC
  Filled 2022-05-02: qty 90, 90d supply, fill #0

## 2022-05-02 MED ORDER — MIDAZOLAM HCL 2 MG/2ML IJ SOLN
INTRAMUSCULAR | Status: DC | PRN
  Administered 2022-05-02: 1 mg via INTRAVENOUS
  Administered 2022-05-02: 2 mg via INTRAVENOUS

## 2022-05-02 MED ORDER — IOHEXOL 350 MG/ML SOLN
INTRAVENOUS | Status: DC | PRN
  Administered 2022-05-02: 205 mL via INTRA_ARTICULAR

## 2022-05-02 MED ORDER — VERAPAMIL HCL 2.5 MG/ML IV SOLN
INTRAVENOUS | Status: AC
  Filled 2022-05-02: qty 2

## 2022-05-02 MED ORDER — SODIUM CHLORIDE 0.9 % WEIGHT BASED INFUSION: 1.0000 mL/kg/h | INTRAVENOUS | Status: DC

## 2022-05-02 MED ORDER — AMLODIPINE BESYLATE 5 MG PO TABS
5.0000 mg | ORAL_TABLET | Freq: Every evening | ORAL | Status: DC
  Administered 2022-05-02: 5 mg via ORAL
  Filled 2022-05-02: qty 1

## 2022-05-02 MED ORDER — ATORVASTATIN CALCIUM 40 MG PO TABS
40.0000 mg | ORAL_TABLET | Freq: Every day | ORAL | Status: DC
  Administered 2022-05-02: 40 mg via ORAL
  Filled 2022-05-02: qty 1

## 2022-05-02 MED ORDER — FENTANYL CITRATE (PF) 100 MCG/2ML IJ SOLN
INTRAMUSCULAR | Status: AC
  Filled 2022-05-02: qty 2

## 2022-05-02 MED ORDER — METOPROLOL SUCCINATE ER 25 MG PO TB24
25.0000 mg | ORAL_TABLET | Freq: Every day | ORAL | Status: DC
  Administered 2022-05-03: 25 mg via ORAL
  Filled 2022-05-02: qty 1

## 2022-05-02 MED ORDER — LOSARTAN POTASSIUM 25 MG PO TABS
25.0000 mg | ORAL_TABLET | Freq: Every evening | ORAL | Status: DC
  Administered 2022-05-02: 25 mg via ORAL
  Filled 2022-05-02: qty 1

## 2022-05-02 MED ORDER — FENTANYL CITRATE (PF) 100 MCG/2ML IJ SOLN
INTRAMUSCULAR | Status: DC | PRN
  Administered 2022-05-02 (×2): 25 ug via INTRAVENOUS

## 2022-05-02 MED ORDER — GLIPIZIDE ER 5 MG PO TB24
5.0000 mg | ORAL_TABLET | Freq: Every day | ORAL | Status: DC
  Administered 2022-05-03: 5 mg via ORAL
  Filled 2022-05-02: qty 1

## 2022-05-02 MED ORDER — CLOPIDOGREL BISULFATE 75 MG PO TABS
75.0000 mg | ORAL_TABLET | Freq: Every day | ORAL | Status: DC
  Administered 2022-05-03: 75 mg via ORAL
  Filled 2022-05-02: qty 1

## 2022-05-02 MED ORDER — SODIUM CHLORIDE 0.9% FLUSH
3.0000 mL | Freq: Two times a day (BID) | INTRAVENOUS | Status: DC
  Administered 2022-05-03: 3 mL via INTRAVENOUS

## 2022-05-02 MED ORDER — HEPARIN SODIUM (PORCINE) 1000 UNIT/ML IJ SOLN
INTRAMUSCULAR | Status: AC
  Filled 2022-05-02: qty 10

## 2022-05-02 MED ORDER — FAMOTIDINE IN NACL 20-0.9 MG/50ML-% IV SOLN
INTRAVENOUS | Status: AC | PRN
  Administered 2022-05-02: 20 mg via INTRAVENOUS

## 2022-05-02 MED ORDER — MONTELUKAST SODIUM 10 MG PO TABS
10.0000 mg | ORAL_TABLET | Freq: Every day | ORAL | Status: DC
  Administered 2022-05-02: 10 mg via ORAL
  Filled 2022-05-02: qty 1

## 2022-05-02 MED ORDER — NITROGLYCERIN 0.4 MG SL SUBL: 0.4000 mg | SUBLINGUAL_TABLET | SUBLINGUAL | Status: DC | PRN

## 2022-05-02 SURGICAL SUPPLY — 32 items
BALLN EMERGE MR 2.5X20 (BALLOONS) ×1
BALLN SAPPHIRE 2.5X12 (BALLOONS) ×1
BALLN SCOREFLEX 3.0X15 (BALLOONS) ×1
BALLOON EMERGE MR 2.5X20 (BALLOONS) IMPLANT
BALLOON SAPPHIRE 2.5X12 (BALLOONS) IMPLANT
BALLOON SCOREFLEX 3.0X15 (BALLOONS) IMPLANT
CATH INFINITI 5 FR JL3.5 (CATHETERS) IMPLANT
CATH INFINITI 5FR JK (CATHETERS) IMPLANT
CATH LAUNCHER 6FR EBU3.5 (CATHETERS) IMPLANT
CATH LAUNCHER 6FR JR4 (CATHETERS) IMPLANT
CATH SHOCKWAVE C2 3.0X12 (CATHETERS) IMPLANT
CATH TELESCOPE 6F GEC (CATHETERS) IMPLANT
DEVICE RAD COMP TR BAND LRG (VASCULAR PRODUCTS) IMPLANT
GLIDESHEATH SLEND A-KIT 6F 22G (SHEATH) IMPLANT
GUIDEWIRE INQWIRE 1.5J.035X260 (WIRE) IMPLANT
INQWIRE 1.5J .035X260CM (WIRE) ×1
KIT ENCORE 26 ADVANTAGE (KITS) IMPLANT
KIT HEART LEFT (KITS) ×1 IMPLANT
PACK CARDIAC CATHETERIZATION (CUSTOM PROCEDURE TRAY) ×1 IMPLANT
STENT ONYX FRONTIER 2.75X08 (Permanent Stent) IMPLANT
STENT ONYX FRONTIER 2.75X15 (Permanent Stent) IMPLANT
STENT SYNERGY XD 2.75X12 (Permanent Stent) IMPLANT
STENT SYNERGY XD 3.0X16 (Permanent Stent) IMPLANT
STENT SYNERGY XD 3.0X24 (Permanent Stent) IMPLANT
STENT SYNERGY XD 3.0X38 (Permanent Stent) IMPLANT
SYNERGY XD 2.75X12 (Permanent Stent) IMPLANT
SYNERGY XD 3.0X16 (Permanent Stent) ×1 IMPLANT
SYNERGY XD 3.0X24 (Permanent Stent) ×1 IMPLANT
SYNERGY XD 3.0X38 (Permanent Stent) ×1 IMPLANT
TRANSDUCER W/STOPCOCK (MISCELLANEOUS) ×1 IMPLANT
TUBING CIL FLEX 10 FLL-RA (TUBING) ×1 IMPLANT
WIRE COUGAR XT STRL 190CM (WIRE) IMPLANT

## 2022-05-02 NOTE — Interval H&P Note (Signed)
History and Physical Interval Note:  05/02/2022 9:01 AM  Daniel Quinn  has presented today for surgery, with the diagnosis of CAD.  The various methods of treatment have been discussed with the patient and family. After consideration of risks, benefits and other options for treatment, the patient has consented to  Procedure(s): LEFT HEART CATH AND CORONARY ANGIOGRAPHY (N/A) and possible coronary angioplasty as a surgical intervention.  The patient's history has been reviewed, patient examined, no change in status, stable for surgery.  I have reviewed the patient's chart and labs.  Questions were answered to the patient's satisfaction.   Cath Lab Visit (complete for each Cath Lab visit)  Clinical Evaluation Leading to the Procedure:   ACS: No.  Non-ACS:    Anginal Classification: CCS II  Anti-ischemic medical therapy: Maximal Therapy (2 or more classes of medications)  Non-Invasive Test Results: Intermediate-risk stress test findings: cardiac mortality 1-3%/year  Prior CABG: No previous CABG   Adrian Prows

## 2022-05-02 NOTE — TOC Transition Note (Signed)
Discharge medications (2) are being stored in the main pharmacy on the ground floor until patient is ready for discharge.   

## 2022-05-02 NOTE — Progress Notes (Signed)
This Paramedic attempted to call report to pt's RN on 6E. RN unable to take report. Will attempt report again soon.

## 2022-05-03 DIAGNOSIS — I10 Essential (primary) hypertension: Secondary | ICD-10-CM | POA: Diagnosis not present

## 2022-05-03 DIAGNOSIS — E7800 Pure hypercholesterolemia, unspecified: Secondary | ICD-10-CM | POA: Diagnosis not present

## 2022-05-03 DIAGNOSIS — Z8673 Personal history of transient ischemic attack (TIA), and cerebral infarction without residual deficits: Secondary | ICD-10-CM | POA: Diagnosis not present

## 2022-05-03 DIAGNOSIS — I25118 Atherosclerotic heart disease of native coronary artery with other forms of angina pectoris: Secondary | ICD-10-CM | POA: Diagnosis not present

## 2022-05-03 LAB — BASIC METABOLIC PANEL
Anion gap: 9 (ref 5–15)
BUN: 24 mg/dL — ABNORMAL HIGH (ref 8–23)
CO2: 23 mmol/L (ref 22–32)
Calcium: 8.4 mg/dL — ABNORMAL LOW (ref 8.9–10.3)
Chloride: 103 mmol/L (ref 98–111)
Creatinine, Ser: 1.18 mg/dL (ref 0.61–1.24)
GFR, Estimated: >60 mL/min (ref >60)
Glucose, Bld: 108 mg/dL — ABNORMAL HIGH (ref 70–99)
Potassium: 3.8 mmol/L (ref 3.5–5.1)
Sodium: 135 mmol/L (ref 135–145)

## 2022-05-03 LAB — CBC
HCT: 35 % — ABNORMAL LOW (ref 39.0–52.0)
Hemoglobin: 12 g/dL — ABNORMAL LOW (ref 13.0–17.0)
MCH: 32.6 pg (ref 26.0–34.0)
MCHC: 34.3 g/dL (ref 30.0–36.0)
MCV: 95.1 fL (ref 80.0–100.0)
Platelets: 239 10*3/uL (ref 150–400)
RBC: 3.68 MIL/uL — ABNORMAL LOW (ref 4.22–5.81)
RDW: 12.9 % (ref 11.5–15.5)
WBC: 11.2 10*3/uL — ABNORMAL HIGH (ref 4.0–10.5)
nRBC: 0 % (ref 0.0–0.2)

## 2022-05-03 NOTE — Plan of Care (Signed)

## 2022-05-03 NOTE — Discharge Summary (Signed)
Duplicated note.

## 2022-05-03 NOTE — Discharge Summary (Signed)
Physician Discharge Summary  Patient ID: Daniel Quinn MRN: [id number] DOB/AGE: [birth year] 77 y.o. Daniel Dials, MD   Admit date: 05/02/2022 Discharge date: 05/03/2022  Primary Discharge Diagnosis 1.  Coronary artery disease of the native vessel with chronic stable angina 2.  PTCA status 3.  Primary hypertension 4.  Hypercholesterolemia   Significant Diagnostic Studies:  Left Heart Catheterization 05/02/22:    LV 110/12, EDP 7 mmHg. Ao 03/24/2024/57, mean 87 mmHg.  There is no pressure gradient across the aortic valve. RCA: Co-Dominant, 3.0 x 18 mm proximal RCA stent and 2.5 x 15 mm Xience Alpine DES in the distal RCA (2017) widely patent.  Mid RCA has a 99% tandem stenosis.  SP 3.0 x 38 mm Synergy XD DES and overlapped with a 2.75 x 16 mm Synergy XD DES, stenosis reduced to 0%. LM: Short, calcified. LCx: Large-caliber vessel.  Previously placed 3.0 x 12 Onyx and a 3.0 x 15 mm Xience Alpine DES (2017) widely patent.  Mild disease in the circumflex.  Codominant. LAD: 3.0 x 18 mm Onyx (2017) widely patent.  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  Stenosis reduced from 90% to 0%.   Recommendation: Complex procedure, patient received 205 mill contrast, in view of multiple stents will keep him for observation and discharge in the morning, patient tolerated the procedure well.  There was no immediate complication.   Patient had excess radiation, 44 minutes of fluoroscopy time and 2554 AirKerma and 29 mGy of radiation.  Patient will be watched out for any radiation burn.           RCA 3.0 x 38 overlapped with a 2.75 x 16 mm Synergy XD DES LAD 3.0 x 38 mm Synergy XD DES and a 2.75 x 15 mm  2.75 x 8 mm Onyx frontier DES overlapping stents  EKG 05/03/2022: Normal sinus rhythm at rate of 66 bpm, normal axis, no evidence of ischemia, normal  EKG.  Isolated T wave inversion in lead III.  No significant change from baseline EKG, compared to 05/02/2022, T wave inversion in inferior leads not present.   Hospital Course: Daniel Quinn is a 77 y.o. male  patient Caucasian male with history of stroke in 2016 for high-grade left vertebral artery stenosis, hypertension, hyperlipidemia, asymptomatic bilateral carotid artery stenosis, multivessel coronary artery disease with angioplasty to his right coronary artery on 02/05/2016 followed by staged intervention to LAD and proximal and mid circumflex coronary artery on 02/26/2016.   Due to recurrence of angina pectoris, he underwent outpatient stress testing which was intermediate risk positive and abnormal, given multivessel coronary disease and multivessel stenting, with a possibility of progression of coronary disease and/or restenosis he was scheduled for cardiac catheterization as he was already on optimal medical therapy.  He underwent cardiac catheterization on 05/02/2022 and found to have complex mid LAD stenosis and right coronary artery stenosis, previously placed stents in all 3 vessels were widely patent.  He underwent successful revascularization with placement of 2 additional stents in the right coronary artery in the midsegment and complex lithotripsy followed by stenting of the mid LAD with implantation of 3 overlapping stents with excellent results.  In view of multiple stents and prolonged procedure, he was kept overnight in the hospital for follow-up.  The following morning as he was stable, walked in the hallway  without any recurrence of chest pain, he was felt to be safe for discharge.  With regard to hypertension, blood pressure is well-controlled on present medical regimen, he is already on high intensity statin, will follow him up in the office closely.  Recommendations on discharge: Follow-up in our office in 1-2 weeks. Medications reconciled. No lifting more than 5 lbs for two  weeks.  Discharge Exam:    05/03/2022    4:18 AM 05/03/2022   12:18 AM 05/02/2022    8:14 PM  Vitals with BMI  Systolic [id number]  Diastolic 69 69 71  Pulse 66 66 61     Physical Exam Neck:     Vascular: No carotid bruit or JVD.  Cardiovascular:     Rate and Rhythm: Normal rate and regular rhythm.     Pulses: Intact distal pulses.     Heart sounds: Normal heart sounds. No murmur heard.    No gallop.  Pulmonary:     Effort: Pulmonary effort is normal.     Breath sounds: Normal breath sounds.  Abdominal:     General: Bowel sounds are normal.     Palpations: Abdomen is soft.  Musculoskeletal:     Right lower leg: No edema.     Left lower leg: No edema.     Labs:   Lab Results  Component Value Date   WBC 11.2 (H) 05/03/2022   HGB 12.0 (L) 05/03/2022   HCT 35.0 (L) 05/03/2022   MCV 95.1 05/03/2022   PLT 239 05/03/2022    Recent Labs  Lab 05/03/22 0218  NA 135  K 3.8  CL 103  CO2 23  BUN 24*  CREATININE 1.18  CALCIUM 8.4*  GLUCOSE 108*    Lipid Panel     Component Value Date/Time   CHOL 125 12/10/2020 1020   TRIG 142 12/10/2020 1020   HDL 37 (L) 12/10/2020 1020   CHOLHDL 4.8 07/02/2014 0658   VLDL 32 07/02/2014 0658   LDLCALC 63 12/10/2020 1020    BNP (last 3 results) No results for input(s): "BNP" in the last 8760 hours.  HEMOGLOBIN A1C Lab Results  Component Value Date   HGBA1C 6.0 (H) 07/02/2014   MPG 126 07/02/2014   No results found for: "TSH"   FOLLOW UP PLANS AND APPOINTMENTS Discharge Instructions     AMB Referral to Cardiac Rehabilitation - Phase II   Complete by: As directed    Diagnosis: Coronary Stents   After initial evaluation and assessments completed: Virtual Based Care may be provided alone or in conjunction with Phase 2 Cardiac Rehab based on patient barriers.: Yes   Intensive Cardiac Rehabilitation (ICR) [place] location only OR Traditional Cardiac Rehabilitation (TCR) *If criteria for ICR are not met will enroll in TCR [facility]  only): Yes      Allergies as of 05/03/2022       Reactions   Brilinta [ticagrelor]    Fever, elevated blood pressure, trembling        Medication List     STOP taking these medications    esomeprazole 20 MG capsule Commonly known as: [place] these medications    amLODipine 5 MG tablet Commonly known as: NORVASC Take 1 tablet (5 mg total) by mouth every evening.   aspirin 81 MG chewable tablet Chew 81 mg by mouth daily.   atorvastatin 40 MG tablet Commonly known as: LIPITOR Take 1 tablet (40 mg total) by mouth daily at 6 PM.  azelastine 0.1 % nasal spray Commonly known as: ASTELIN Place 2 sprays into both nostrils at bedtime. Use in each nostril as directed   cetirizine 10 MG tablet Commonly known as: ZYRTEC Take 10 mg by mouth daily.   clopidogrel 75 MG tablet Commonly known as: PLAVIX Take 1 tablet (75 mg total) by mouth daily with breakfast.   EPINEPHrine 0.3 mg/0.3 mL Soaj injection Commonly known as: EPI-PEN Inject 0.3 mg into the muscle as needed for anaphylaxis.   ezetimibe 10 MG tablet Commonly known as: ZETIA TAKE 1 TABLET BY MOUTH EVERY DAY What changed: when to take this   FLUoxetine 20 MG capsule Commonly known as: PROZAC Take 20 mg by mouth daily.   fluticasone 50 MCG/ACT nasal spray Commonly known as: FLONASE Place 1 spray into both nostrils in the morning.   glipiZIDE 5 MG 24 hr tablet Commonly known as: GLUCOTROL XL Take 5 mg by mouth daily.   levothyroxine 112 MCG tablet Commonly known as: SYNTHROID Take 112 mcg by mouth daily before breakfast.   losartan 25 MG tablet Commonly known as: COZAAR Take 1 tablet (25 mg total) by mouth every evening.   Melatonin 10 MG Caps Take 1 tablet by mouth at bedtime as needed (Sleep).   metoprolol succinate 25 MG 24 hr tablet Commonly known as: TOPROL-XL TAKE 1 TABLET BY MOUTH EVERY DAY   montelukast 10 MG tablet Commonly known as: SINGULAIR Take 10 mg by mouth at  bedtime.   multivitamin with minerals Tabs tablet Take 1 tablet by mouth daily.   nitroGLYCERIN 0.4 MG SL tablet Commonly known as: NITROSTAT Place 1 tablet (0.4 mg total) under the tongue every 5 (five) minutes as needed for chest pain.   pantoprazole 40 MG tablet Commonly known as: Protonix Take 1 tablet (40 mg total) by mouth daily.   vitamin C 1000 MG tablet Take 1,000 mg by mouth daily.        Follow-up Information     Adrian Prows, MD Follow up on 05/19/2022.   Specialty: Cardiology Why: 1:45 PM. Please bring all medications Contact information: [place] [id number] [place], [facility]

## 2022-05-03 NOTE — Progress Notes (Signed)
CARDIAC REHAB PHASE I   PRE:  Rate/Rhythm: 71 NSR  BP:  Supine: 137/74     SaO2: 94 RA  MODE:  Ambulation: 470 ft   POST:  Rate/Rhythm: 68NSR  BP:  Supine: 149/68      SaO2: 97RA  Pt tolerated exercise well and AMB 470 ft with no assistive device, and standby assist. Pt had no rest break, chest pain, SOB or pain. Education given to pt on heart healthy diet, radial/femoral weight restrictions, adherence to NTG, Brilinta and ASA.  Home exercise guidelines given and will refer to cardiac rehab phase 2 in [place]. Pt left in the bed with call bell in reach. All questions were answered and pt verbalized understanding.  [id number] Kirby Funk ACSM-CEP 05/03/2022 8:51 AM

## 2022-05-05 ENCOUNTER — Encounter (HOSPITAL_COMMUNITY): Payer: Self-pay | Admitting: Cardiology

## 2022-05-06 LAB — LIPOPROTEIN A (LPA): Lipoprotein (a): 216.9 nmol/L — ABNORMAL HIGH (ref <75.0)

## 2022-05-06 NOTE — Progress Notes (Signed)
LPA high and will try and look into clinical trials

## 2022-05-16 ENCOUNTER — Other Ambulatory Visit (HOSPITAL_COMMUNITY): Payer: Self-pay

## 2022-05-19 ENCOUNTER — Ambulatory Visit: Payer: 59 | Admitting: Cardiology

## 2022-05-19 ENCOUNTER — Ambulatory Visit: Payer: Medicare HMO | Admitting: Cardiology

## 2022-05-19 ENCOUNTER — Encounter: Payer: Self-pay | Admitting: Cardiology

## 2022-05-19 VITALS — BP 108/60 | HR 65 | Resp 16 | Ht 66.0 in | Wt 184.8 lb

## 2022-05-19 DIAGNOSIS — E7800 Pure hypercholesterolemia, unspecified: Secondary | ICD-10-CM

## 2022-05-19 DIAGNOSIS — I25118 Atherosclerotic heart disease of native coronary artery with other forms of angina pectoris: Secondary | ICD-10-CM

## 2022-05-19 DIAGNOSIS — E7841 Elevated Lipoprotein(a): Secondary | ICD-10-CM

## 2022-05-19 NOTE — Progress Notes (Signed)
Primary Physician/Referring:  Aura Dials, MD  Patient ID: Daniel Quinn, male    DOB: 09-29-1945, 77 y.o.   MRN: [id number]  Chief Complaint  Patient presents with   Post cath   HPI:    Daniel Quinn  is a 77 y.o. Caucasian male with history of stroke in 2016 for high-grade left vertebral artery stenosis, hypertension, hyperlipidemia, asymptomatic bilateral carotid artery stenosis, multivessel coronary artery disease with angioplasty to his right coronary artery on 02/05/2016 followed by staged intervention to LAD and proximal and mid circumflex coronary artery on 02/26/2016.  Due to angina pectoris, abnormal nuclear stress test, underwent cardiac catheterization on 05/02/2022 and successful revascularization to the mid LAD and also mid RCA and distal RCA.  He now presents for follow-up.  He has not had any complications from the procedure.  He has not had any further chest pain and he has not used any sublingual nitroglycerin.   No rest pain, no dyspnea, no PND or orthopnea.  States that he has not had any skin burns after cardiac catheterization.  Past Medical History:  Diagnosis Date   Anxiety    BCC (basal cell carcinoma of skin) 04/01/2014   bcc ulc. right forehead tx exc   BCC (basal cell carcinoma of skin) 04/26/2015   bcc + marg reck 54mo  BCC (basal cell carcinoma of skin) 06/09/2016   bcc sup nod right mid back tx cx3 560f  BCC (basal cell carcinoma of skin) 01/14/2018   bcc nod. left nose tx MOHS   BCC (basal cell carcinoma of skin) 12/19/2019   nod-right shoulder-anterior   Coronary artery disease    CVA (cerebral infarction) 07/01/14   Depression    Difficulty swallowing    Environmental allergies    GERD (gastroesophageal reflux disease)    Hyperlipidemia    Hypertension    Hypothyroid    Macrocytosis    Polyarthritis    chronic   Restless leg    SCC (squamous cell carcinoma) 07/10/2017   scc in situ  left outer cheek tx clear   SCC (squamous cell  carcinoma) 01/14/2018   scc in situ left arm tx cx3 [id number] Squamous cell carcinoma of skin 07/16/2019   in situ-right forearm posterior  ( watch, no treatment needed per J[facility])   [facility]   "right leg drags when I get tired; very little feeling of hot/cold on my RUE/RLE now" (02/05/2016)   Swelling    right arm/hand, legs "since my stroke in 2015" (02/05/2016)   Past Surgical History:  Procedure Laterality Date   CARDIAC CATHETERIZATION N/A 02/05/2016   Procedure: Left Heart Cath and Coronary Angiography;  Surgeon: JayAdrian ProwsD;  Location: [facility];  Service: Cardiovascular;  Laterality: N/A;   CARDIAC CATHETERIZATION N/A 02/05/2016   Procedure: Coronary Stent Intervention;  Surgeon: JayAdrian ProwsD;  Location: [facility];  Service: Cardiovascular;  Laterality: N/A;   CARDIAC CATHETERIZATION N/A 02/26/2016   Procedure: Coronary Stent Intervention;  Surgeon: JayAdrian ProwsD;  Location: [facility];  Service: Cardiovascular;  Laterality: N/A;   CORONARY LITHOTRIPSY N/A 05/02/2022   Procedure: CORONARY LITHOTRIPSY;  Surgeon: GanAdrian ProwsD;  Location: [facility];  Service: Cardiovascular;  Laterality: N/A;   CORONARY STENT INTERVENTION N/A 05/02/2022   Procedure: CORONARY STENT INTERVENTION;  Surgeon: GanAdrian ProwsD;  Location: [facility];  Service: Cardiovascular;  Laterality: N/A;   CORONARY STENT PLACEMENT  02/26/2016  Successful atherectomy with CSI diamondback catheter followed by stenting with 3.0 x 18 mm onyx in the proximal LAD, stenosis reduced from 90% to 0%.   LEFT HEART CATH AND CORONARY ANGIOGRAPHY N/A 05/02/2022   Procedure: LEFT HEART CATH AND CORONARY ANGIOGRAPHY;  Surgeon: Adrian Prows, MD;  Location: [facility];  Service: Cardiovascular;  Laterality: N/A;   NASAL SEPTUM SURGERY  2000s   Social History   Tobacco Use   Smoking status: Never   Smokeless tobacco: Never  Substance Use Topics   Alcohol use: Yes    Alcohol/week:  3.0 standard drinks of alcohol    Types: 3 Cans of beer per week    Comment: occasionally   Marital Status: Widowed   ROS  Review of Systems  Cardiovascular:  Negative for chest pain, dyspnea on exertion and leg swelling.   Objective  Blood pressure 108/60, pulse 65, resp. rate 16, height '5\' 6"'$  (1.676 m), weight 184 lb 12.8 oz (83.8 kg), SpO2 94 %.     05/19/2022    1:29 PM 05/03/2022   10:00 AM 05/03/2022    9:00 AM  Vitals with BMI  Height '5\' 6"'$     Weight 184 lbs 13 oz    BMI 123XX123    Systolic 123XX123 [id number]  Diastolic 60 70 70  Pulse 65  74     Physical Exam Neck:     Vascular: No carotid bruit or JVD.  Cardiovascular:     Rate and Rhythm: Normal rate and regular rhythm.     Pulses: Intact distal pulses.     Heart sounds: Normal heart sounds. No murmur heard.    No gallop.  Pulmonary:     Effort: Pulmonary effort is normal.     Breath sounds: Normal breath sounds.  Abdominal:     General: Bowel sounds are normal.     Palpations: Abdomen is soft.  Musculoskeletal:     Right lower leg: No edema.     Left lower leg: No edema.    Laboratory examination:   Lab Results  Component Value Date   CHOL 125 12/10/2020   HDL 37 (L) 12/10/2020   LDLCALC 63 12/10/2020   TRIG 142 12/10/2020   CHOLHDL 4.8 07/02/2014    Lipoprotein (a) 05/03/2022 <75.0 nmol/L 216.9 High    External labs:  Cholesterol, total 132.000 m 01/01/2022 HDL 41.000 mg 01/01/2022 LDL 67.000 mg 01/01/2022 Triglycerides 135.000 m 01/01/2022  A1C 6.000 % 01/01/2022 TSH 2.140 01/01/2022  Hemoglobin 13.800 g/d 01/01/2022 Platelets 280.000 X1 12/21/2020  Creatinine, Serum 1.090 mg/ 01/01/2022 Potassium 4.600 mm 01/01/2022 Magnesium 2.300 MG/ 11/23/2019 ALT (SGPT) 24.000 U/L 01/01/2022   Allergies   Allergies  Allergen Reactions   Brilinta [Ticagrelor]     Fever, elevated blood pressure, trembling     Medications    Current Outpatient Medications:    amLODipine (NORVASC) 5 MG tablet, Take  1 tablet (5 mg total) by mouth every evening., Disp: 90 tablet, Rfl: 1   Ascorbic Acid (VITAMIN C) 1000 MG tablet, Take 1,000 mg by mouth daily., Disp: , Rfl:    aspirin 81 MG chewable tablet, Chew 81 mg by mouth daily., Disp: , Rfl:    atorvastatin (LIPITOR) 40 MG tablet, Take 1 tablet (40 mg total) by mouth daily at 6 PM., Disp: 30 tablet, Rfl: 0   azelastine (ASTELIN) 0.1 % nasal spray, Place 2 sprays into both nostrils at bedtime. Use in each nostril as directed, Disp: , Rfl:    cetirizine (  ZYRTEC) 10 MG tablet, Take 10 mg by mouth daily., Disp: , Rfl:    clopidogrel (PLAVIX) 75 MG tablet, Take 1 tablet (75 mg total) by mouth daily with breakfast., Disp: 90 tablet, Rfl: 1   EPINEPHrine 0.3 mg/0.3 mL IJ SOAJ injection, Inject 0.3 mg into the muscle as needed for anaphylaxis., Disp: , Rfl:    ezetimibe (ZETIA) 10 MG tablet, TAKE 1 TABLET BY MOUTH EVERY DAY (Patient taking differently: Take 10 mg by mouth every evening.), Disp: 90 tablet, Rfl: 3   FLUoxetine (PROZAC) 20 MG capsule, Take 20 mg by mouth daily., Disp: , Rfl:    fluticasone (FLONASE) 50 MCG/ACT nasal spray, Place 1 spray into both nostrils in the morning., Disp: , Rfl:    glipiZIDE (GLUCOTROL XL) 5 MG 24 hr tablet, Take 5 mg by mouth daily., Disp: , Rfl:    levothyroxine (SYNTHROID, LEVOTHROID) 112 MCG tablet, Take 112 mcg by mouth daily before breakfast., Disp: , Rfl:    losartan (COZAAR) 25 MG tablet, Take 1 tablet (25 mg total) by mouth every evening., Disp: 90 tablet, Rfl: 3   Melatonin 10 MG CAPS, Take 1 tablet by mouth at bedtime as needed (Sleep)., Disp: , Rfl:    metoprolol succinate (TOPROL-XL) 25 MG 24 hr tablet, TAKE 1 TABLET BY MOUTH EVERY DAY (Patient taking differently: Take 25 mg by mouth daily.), Disp: 90 tablet, Rfl: 2   montelukast (SINGULAIR) 10 MG tablet, Take 10 mg by mouth at bedtime., Disp: , Rfl:    Multiple Vitamin (MULTIVITAMIN WITH MINERALS) TABS tablet, Take 1 tablet by mouth daily., Disp: , Rfl:     nitroGLYCERIN (NITROSTAT) 0.4 MG SL tablet, Place 1 tablet (0.4 mg total) under the tongue every 5 (five) minutes as needed for chest pain., Disp: 25 tablet, Rfl: 3   pantoprazole (PROTONIX) 40 MG tablet, Take 1 tablet (40 mg total) by mouth daily., Disp: 90 tablet, Rfl: 0    Radiology:  No results found.  Cardiac Studies:   Carotid artery duplex 06/10/21:  Duplex suggests stenosis in the right internal carotid artery (16-49%).  Duplex suggests stenosis in the right external carotid artery (<50%).  Duplex suggests stenosis in the left internal carotid artery (16-49%).  Antegrade left vertebral artery flow.  Compared to 11/27/2020, mild regression of the stenosis severity in the left ICA. Follow up in one year is appropriate if clinically indicated.  PCV MYOCARDIAL PERFUSION WO LEXISCAN 04/14/2022  Narrative Lexiscan/modified Bruce Tetrofosmin stress test 04/16/2022: Lexiscan/modified Bruce nuclear stress test performed using 1-day protocol. Patient experienced chest tightness at heart rate of 75% MPHR. Stress EKG is non-diagnostic, as this is pharmacological stress test. In addition, stress EKG at 75% MPHR showed sinus tachycardia, 2.3 mm downsloping ST depression in leads V4-V6 that persisted beyond 2 min into recovery. SPECT images show medium sized, severe intensity, partially reversible perfusion defect in apical to basal inferior, basal inferoseptal, inferolateral myocardium, along with moderate decrease in global wall motion and thickening. Stress LVEF 40%. Intermediate risk study.   PCV ECHOCARDIOGRAM COMPLETE 04/18/2022  Narrative Echocardiogram 04/18/2022: Normal LV systolic function with visual EF 55-60%. Left ventricle cavity is normal in size. Normal left ventricular wall thickness. Normal global wall motion. Normal diastolic filling pattern, normal LAP. Calculated EF 56%. Structurally normal mitral valve.  Mild to moderate mitral regurgitation. Structurally normal  tricuspid valve.  Mild tricuspid regurgitation. No evidence of pulmonary hypertension. Compared to 05/2020, MR has slightly progressed.   Left Heart Catheterization 05/02/22:    LV 110/12,  EDP 7 mmHg. Ao 03/24/2024/57, mean 87 mmHg.  There is no pressure gradient across the aortic valve. RCA: Co-Dominant, 3.0 x 18 mm proximal RCA stent and 2.5 x 15 mm Xience Alpine DES in the distal RCA (2017) widely patent.  Mid RCA has a 99% tandem stenosis.  SP 3.0 x 38 mm Synergy XD DES and overlapped with a 2.75 x 16 mm Synergy XD DES, stenosis reduced to 0%. LM: Short, calcified. LCx: Large-caliber vessel.  Previously placed 3.0 x 12 Onyx and a 3.0 x 15 mm Xience Alpine DES (2017) widely patent.  Mild disease in the circumflex.  Codominant. LAD: 3.0 x 18 mm Onyx (2017) widely patent.  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  Stenosis reduced from 90% to 0%.    EKG   EKG 05/19/2022: Normal sinus rhythm at rate of 60 bpm, normal EKG.  Compared to 04/07/2022, no change.  Assessment     ICD-10-CM   1. Coronary artery disease of native artery of native heart with stable angina pectoris ([place])  I25.118 EKG 12-Lead    2. Hypercholesteremia  E78.00     3. 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   There are no discontinued medications.   Recommendations:   Daniel Quinn is a 77 y.o. Caucasian male with history of stroke in 2016 for high-grade left vertebral artery stenosis, hypertension, hyperlipidemia, asymptomatic bilateral carotid artery stenosis, multivessel coronary artery disease with angioplasty to his right coronary artery on 02/05/2016 followed by staged intervention to LAD and proximal and mid circumflex coronary artery on 02/26/2016.  Due to angina pectoris, abnormal nuclear stress test, underwent  cardiac catheterization on 05/02/2022 and successful revascularization to the mid LAD and also mid RCA and distal RCA.  He now presents for follow-up.  He has not had any complications from the procedure.  1. Coronary artery disease of native artery of native heart with stable angina pectoris [facility]) Fairly extensive cardiac catheterization and complex coronary angioplasty, patient received excess radiation.  There is no radiation injury to his skin. He has not used any sublingual nitroglycerin, he remains asymptomatic and continues to be physically active.  Continue present medications.  2. Hypercholesteremia His LDL is <70.  However I checked his Lp(a) which is markedly elevated.  IV I will consider placing him in clinical trial, patient is willing to participate. Patient for J3L-MC-EZEF trial (Lpa). Patient will be eligible in May 2024 in view of recent revascularization.   3. Elevated Lp(a) I have reviewed the results of the elevated LP(a), is willing to participate in clinical trials.  Otherwise he is on high intensity statin along with ezetimibe, continue the same for now.  I will see him back in 6 months for follow-up.  He has been scheduled for carotid artery duplex for bilateral asymptomatic carotid stenosis and will follow-up on this.    Adrian Prows, MD, [facility] 05/19/2022, 2:17 PM Office: [phone] Fax: [phone] Pager: [phone]

## 2022-05-22 ENCOUNTER — Ambulatory Visit: Payer: 59

## 2022-05-22 DIAGNOSIS — I6523 Occlusion and stenosis of bilateral carotid arteries: Secondary | ICD-10-CM

## 2022-06-05 ENCOUNTER — Other Ambulatory Visit: Payer: 59

## 2022-06-12 ENCOUNTER — Ambulatory Visit: Payer: 59 | Admitting: Student

## 2022-07-02 ENCOUNTER — Other Ambulatory Visit (HOSPITAL_COMMUNITY): Payer: Self-pay

## 2022-08-14 ENCOUNTER — Telehealth: Payer: Self-pay

## 2022-08-14 NOTE — Telephone Encounter (Signed)
He can stop

## 2022-08-14 NOTE — Telephone Encounter (Signed)
Patient was told by clinical trials to stop taking his glipizide due to low blood sugar, he said you prescribed it for him but I didn't see that you had.

## 2022-08-15 NOTE — Telephone Encounter (Signed)
Called patient no answer left a vm

## 2022-09-08 ENCOUNTER — Telehealth: Payer: Self-pay | Admitting: Cardiology

## 2022-09-08 DIAGNOSIS — E7841 Elevated Lipoprotein(a): Secondary | ICD-10-CM

## 2022-09-08 DIAGNOSIS — I25118 Atherosclerotic heart disease of native coronary artery with other forms of angina pectoris: Secondary | ICD-10-CM

## 2022-09-08 NOTE — Telephone Encounter (Signed)
Patient is now enrolled in the [place] on the Reduction of MACE w/ Elevated Lp(a) in Established CVD or High Risk for CVD (J3L-MC-EZEF-ACCLAIM-Lp(a))

## 2022-09-08 NOTE — Telephone Encounter (Signed)
Patient is now enrolled in the [place] on the Reduction of MACE w/ Elevated Lp(a) in Established CVD or High Risk for CVD (J3L-MC-EZEF-ACCLAIM-Lp(a))  

## 2022-09-17 ENCOUNTER — Encounter: Payer: Self-pay | Admitting: Cardiology

## 2022-09-17 NOTE — Progress Notes (Signed)
Abscess 08/11/2022:  Total cholesterol 129, triglycerides 151, HDL 39, LDL 60.  Lp(a) 232.  Urinary albumin to creatinine ratio 3.0.  BUN 19, creatinine 1.16, EGFR 66 mL, potassium 5.1.  LFTs normal.  Hb 13.9/HCT 42.0, platelets 292.

## 2022-09-24 ENCOUNTER — Other Ambulatory Visit: Payer: Self-pay | Admitting: Cardiology

## 2022-10-10 ENCOUNTER — Encounter: Payer: Self-pay | Admitting: Cardiology

## 2022-10-11 NOTE — Progress Notes (Signed)
Research labs 10/01/2022:  BUN 19, creatinine 1.07, alkaline phosphatase mildly elevated at 148, LFT including ALT and AST normal.  Potassium 4.4.  Hb 13.5/HCT 43.0, platelets 242.  Hs-CRP 0.19.

## 2022-10-14 ENCOUNTER — Other Ambulatory Visit: Payer: Self-pay | Admitting: Cardiology

## 2022-10-14 DIAGNOSIS — E7800 Pure hypercholesterolemia, unspecified: Secondary | ICD-10-CM

## 2022-11-17 ENCOUNTER — Ambulatory Visit: Payer: 59 | Admitting: Cardiology

## 2022-11-18 ENCOUNTER — Other Ambulatory Visit: Payer: Self-pay | Admitting: Cardiology

## 2022-12-17 ENCOUNTER — Ambulatory Visit (INDEPENDENT_AMBULATORY_CARE_PROVIDER_SITE_OTHER): Payer: Medicare HMO | Admitting: Dermatology

## 2022-12-17 ENCOUNTER — Encounter: Payer: Self-pay | Admitting: Dermatology

## 2022-12-17 DIAGNOSIS — D492 Neoplasm of unspecified behavior of bone, soft tissue, and skin: Secondary | ICD-10-CM

## 2022-12-17 DIAGNOSIS — L578 Other skin changes due to chronic exposure to nonionizing radiation: Secondary | ICD-10-CM | POA: Diagnosis not present

## 2022-12-17 DIAGNOSIS — D0439 Carcinoma in situ of skin of other parts of face: Secondary | ICD-10-CM | POA: Diagnosis not present

## 2022-12-17 DIAGNOSIS — L814 Other melanin hyperpigmentation: Secondary | ICD-10-CM | POA: Diagnosis not present

## 2022-12-17 DIAGNOSIS — D1801 Hemangioma of skin and subcutaneous tissue: Secondary | ICD-10-CM

## 2022-12-17 DIAGNOSIS — L821 Other seborrheic keratosis: Secondary | ICD-10-CM

## 2022-12-17 DIAGNOSIS — Z1283 Encounter for screening for malignant neoplasm of skin: Secondary | ICD-10-CM | POA: Diagnosis not present

## 2022-12-17 DIAGNOSIS — W908XXA Exposure to other nonionizing radiation, initial encounter: Secondary | ICD-10-CM | POA: Diagnosis not present

## 2022-12-17 DIAGNOSIS — L570 Actinic keratosis: Secondary | ICD-10-CM | POA: Diagnosis not present

## 2022-12-17 DIAGNOSIS — D485 Neoplasm of uncertain behavior of skin: Secondary | ICD-10-CM

## 2022-12-17 DIAGNOSIS — D229 Melanocytic nevi, unspecified: Secondary | ICD-10-CM

## 2022-12-17 DIAGNOSIS — Z85828 Personal history of other malignant neoplasm of skin: Secondary | ICD-10-CM

## 2022-12-17 NOTE — Progress Notes (Addendum)
New Patient Visit   Subjective  Daniel Quinn is a 77 y.o. male who presents for the following: Skin Cancer Screening and Upper Body Skin Exam - History BCC and SCC (Former patient of Dr Jorja Loa)  The patient presents for Upper Body Skin Exam (UBSE) for skin cancer screening and mole check. The patient has spots, moles and lesions to be evaluated, some may be new or changing and the patient may have concern these could be cancer.    The following portions of the chart were reviewed this encounter and updated as appropriate: medications, allergies, medical history  Review of Systems:  No other skin or systemic complaints except as noted in HPI or Assessment and Plan.  Objective  Well appearing patient in no apparent distress; mood and affect are within normal limits.  All skin waist up examined. Relevant physical exam findings are noted in the Assessment and Plan.  Right Forehead 1.0 cm pink scaly plaque       Left Temple 1.0 cm pink scaly plaque       Face, bilateral forearms (20) Erythematous thin papules/macules with gritty scale.     Assessment & Plan   Neoplasm of uncertain behavior of skin (2) Right Forehead  Skin / nail biopsy Type of biopsy: tangential   Informed consent: discussed and consent obtained   Timeout: patient name, date of birth, surgical site, and procedure verified   Procedure prep:  Patient was prepped and draped in usual sterile fashion Prep type:  Isopropyl alcohol Anesthesia: the lesion was anesthetized in a standard fashion   Anesthetic:  1% lidocaine w/ epinephrine 1-100,000 buffered w/ 8.4% NaHCO3 Instrument used: flexible razor blade   Hemostasis achieved with: pressure, aluminum chloride and electrodesiccation   Outcome: patient tolerated procedure well   Post-procedure details: sterile dressing applied and wound care instructions given   Dressing type: bandage and petrolatum    Specimen 1 - Surgical pathology Differential  Diagnosis: R/O SCC Check Margins: No  Left Temple  Skin / nail biopsy Type of biopsy: tangential   Informed consent: discussed and consent obtained   Timeout: patient name, date of birth, surgical site, and procedure verified   Procedure prep:  Patient was prepped and draped in usual sterile fashion Prep type:  Isopropyl alcohol Anesthesia: the lesion was anesthetized in a standard fashion   Anesthetic:  1% lidocaine w/ epinephrine 1-100,000 buffered w/ 8.4% NaHCO3 Instrument used: flexible razor blade   Hemostasis achieved with: pressure, aluminum chloride and electrodesiccation   Outcome: patient tolerated procedure well   Post-procedure details: sterile dressing applied and wound care instructions given   Dressing type: bandage and petrolatum    Specimen 2 - Surgical pathology Differential Diagnosis: R/O SCC Check Margins: No  AK (actinic keratosis) (20) Face, bilateral forearms  Will plan topical treatment on follow up.  Destruction of lesion - Face, bilateral forearms (20) Complexity: simple   Destruction method: cryotherapy   Informed consent: discussed and consent obtained   Timeout:  patient name, date of birth, surgical site, and procedure verified Lesion destroyed using liquid nitrogen: Yes   Region frozen until ice ball extended beyond lesion: Yes   Outcome: patient tolerated procedure well with no complications   Post-procedure details: wound care instructions given    Skin exam for malignant neoplasm  Multiple benign melanocytic nevi  Lentigines  Actinic skin damage  Cherry angioma  Seborrheic keratosis   Skin cancer screening performed today.  Actinic Damage - Chronic condition, secondary to cumulative UV/sun  exposure - diffuse scaly erythematous macules with underlying dyspigmentation - Recommend daily broad spectrum sunscreen SPF 30+ to sun-exposed areas, reapply every 2 hours as needed.  - Staying in the shade or wearing long sleeves, sun  glasses (UVA+UVB protection) and wide brim hats (4-inch brim around the entire circumference of the hat) are also recommended for sun protection.  - Call for new or changing lesions.  Lentigines, Seborrheic Keratoses, Hemangiomas - Benign normal skin lesions - Benign-appearing - Call for any changes  Melanocytic Nevi - Tan-brown and/or pink-flesh-colored symmetric macules and papules - Benign appearing on exam today - Observation - Call clinic for new or changing moles - Recommend daily use of broad spectrum spf 30+ sunscreen to sun-exposed areas.   HISTORY OF BASAL CELL CARCINOMA OF THE SKIN - No evidence of recurrence today - Recommend regular full body skin exams - Recommend daily broad spectrum sunscreen SPF 30+ to sun-exposed areas, reapply every 2 hours as needed.  - Call if any new or changing lesions are noted between office visits  HISTORY OF SQUAMOUS CELL CARCINOMA OF THE SKIN - No evidence of recurrence today - No lymphadenopathy - Recommend regular full body skin exams - Recommend daily broad spectrum sunscreen SPF 30+ to sun-exposed areas, reapply every 2 hours as needed.  - Call if any new or changing lesions are noted between office visits     Return in about 3 months (around 03/18/2023) for AK follow up.  I, Joanie Coddington, CMA, am acting as scribe for [employer], DO .   Documentation: I have reviewed the above documentation for accuracy and completeness, and I agree with the above.  Langston Reusing, DO

## 2022-12-17 NOTE — Patient Instructions (Signed)
Cryotherapy Aftercare  Wash gently with soap and water everyday.   Apply Vaseline and Band-Aid daily until healed.    .Patient Handout: Wound Care for Skin Biopsy Site   Taking Care of Your Skin Biopsy Site  Proper care of the biopsy site is essential for promoting healing and minimizing scarring. This handout provides instructions on how to care for your biopsy site to ensure optimal recovery.  1. Cleaning the Wound:  Clean the biopsy site daily with gentle soap and water. Gently pat the area dry with a clean, soft towel. Avoid harsh scrubbing or rubbing the area, as this can irritate the skin and delay healing.   2. Applying Aquaphor and Bandage:  After cleaning the wound, apply a thin layer of Aquaphor ointment to the biopsy site. Cover the area with a sterile bandage to protect it from dirt, bacteria, and friction. Change the bandage daily or as needed if it becomes soiled or wet. 3. Continued Care for One Week:  Repeat the cleaning, Aquaphor application, and bandaging process daily for one week following the biopsy procedure. Keeping the wound clean and moist during this initial healing period will help prevent infection and promote optimal healing.   4. Massaging Aquaphor into the Area:  After one week, discontinue the use of bandages but continue to apply Aquaphor to the biopsy site. Gently massage the Aquaphor into the area using circular motions. Massaging the skin helps to promote circulation and prevent the formation of scar tissue.   Additional Tips:  Avoid exposing the biopsy site to direct sunlight during the healing process, as this can cause hyperpigmentation or worsen scarring. If you experience any signs of infection, such as increased redness, swelling, warmth, or drainage from the wound, contact your healthcare provider immediately. Follow any additional instructions provided by your healthcare provider for caring for the biopsy site and managing any  discomfort. Conclusion:  Taking proper care of your skin biopsy site is crucial for ensuring optimal healing and minimizing scarring. By following these instructions for cleaning, applying Aquaphor, and massaging the area, you can promote a smooth and successful recovery. If you have any questions or concerns about caring for your biopsy site, don't hesitate to contact your healthcare provider for guidance.

## 2022-12-19 LAB — SURGICAL PATHOLOGY

## 2022-12-22 NOTE — Progress Notes (Signed)
Please call patient and review results and refer for Mohs with Dr Caralyn Guile for positive skin cancer(s)  Thanks!   1. Skin, right forehead :  --> Mohs      SQUAMOUS CELL CARCINOMA IN SITU, BASE INVOLVED        2. Skin, left temple :--> Treated by Bx, will recheck site at next FBE      ACTINIC KERATOSIS AND SEBORRHEIC KERATOSIS

## 2022-12-23 ENCOUNTER — Ambulatory Visit: Payer: 59 | Admitting: Cardiology

## 2022-12-23 ENCOUNTER — Telehealth: Payer: Self-pay

## 2022-12-23 NOTE — Telephone Encounter (Signed)
Advised patient of results and will refer to Dr Caralyn Guile for Mohs of [facility] in situ/hd

## 2022-12-23 NOTE — Telephone Encounter (Signed)
-----   Message from Langston Reusing sent at 12/22/2022 11:27 AM EDT ----- Please call patient and review results and refer for Mohs with Dr Caralyn Guile for positive skin cancer(s)  Thanks!   1. Skin, right forehead :  --> Mohs      SQUAMOUS CELL CARCINOMA IN SITU, BASE INVOLVED        2. Skin, left temple :--> Treated by Bx, will recheck site at next FBE      ACTINIC KERATOSIS AND SEBORRHEIC KERATOSIS

## 2023-01-13 ENCOUNTER — Telehealth: Payer: Self-pay | Admitting: Cardiology

## 2023-01-13 DIAGNOSIS — I10 Essential (primary) hypertension: Secondary | ICD-10-CM

## 2023-01-13 DIAGNOSIS — I25118 Atherosclerotic heart disease of native coronary artery with other forms of angina pectoris: Secondary | ICD-10-CM

## 2023-01-13 MED ORDER — AMLODIPINE BESYLATE 5 MG PO TABS: 5.0000 mg | ORAL_TABLET | Freq: Every evening | ORAL | 0 refills | Status: DC

## 2023-01-13 NOTE — Telephone Encounter (Signed)
Pt's medication was sent to pt's pharmacy as requested. Confirmation received.  °

## 2023-01-13 NOTE — Telephone Encounter (Signed)
*  STAT* If patient is at the pharmacy, call can be transferred to refill team.   1. Which medications need to be refilled? (please list name of each medication and dose if known)   amLODipine (NORVASC) 5 MG tablet (Expired)   2. Would you like to learn more about the convenience, safety, & potential cost savings by using the [facility]?   3. Are you open to using the [facility] (Type [facility]. ).   4. Which pharmacy/location (including street and city if local pharmacy) is medication to be sent to?  [id number] - [place],  - [address] AT [address] OF [address]   5. Do they need a 30 day or 90 day supply?   90 day  Patient stated he still have a couple of tablets left. Patient has appointment scheduled on 04/06/23.

## 2023-02-16 ENCOUNTER — Other Ambulatory Visit: Payer: Self-pay | Admitting: Cardiology

## 2023-02-16 DIAGNOSIS — E7800 Pure hypercholesterolemia, unspecified: Secondary | ICD-10-CM

## 2023-03-13 ENCOUNTER — Other Ambulatory Visit: Payer: Self-pay | Admitting: Cardiology

## 2023-03-13 DIAGNOSIS — E119 Type 2 diabetes mellitus without complications: Secondary | ICD-10-CM

## 2023-03-13 DIAGNOSIS — I25118 Atherosclerotic heart disease of native coronary artery with other forms of angina pectoris: Secondary | ICD-10-CM

## 2023-03-17 ENCOUNTER — Other Ambulatory Visit: Payer: Self-pay | Admitting: Cardiology

## 2023-03-19 ENCOUNTER — Ambulatory Visit: Payer: 59 | Admitting: Dermatology

## 2023-04-06 ENCOUNTER — Ambulatory Visit: Payer: Medicare HMO | Attending: Cardiology | Admitting: Cardiology

## 2023-04-06 ENCOUNTER — Encounter: Payer: Self-pay | Admitting: Cardiology

## 2023-04-06 VITALS — BP 108/62 | HR 57 | Resp 16 | Ht 66.0 in | Wt 189.6 lb

## 2023-04-06 DIAGNOSIS — E7800 Pure hypercholesterolemia, unspecified: Secondary | ICD-10-CM | POA: Diagnosis not present

## 2023-04-06 DIAGNOSIS — I6523 Occlusion and stenosis of bilateral carotid arteries: Secondary | ICD-10-CM

## 2023-04-06 DIAGNOSIS — Z006 Encounter for examination for normal comparison and control in clinical research program: Secondary | ICD-10-CM

## 2023-04-06 DIAGNOSIS — E119 Type 2 diabetes mellitus without complications: Secondary | ICD-10-CM

## 2023-04-06 DIAGNOSIS — I25118 Atherosclerotic heart disease of native coronary artery with other forms of angina pectoris: Secondary | ICD-10-CM | POA: Diagnosis not present

## 2023-04-06 DIAGNOSIS — I10 Essential (primary) hypertension: Secondary | ICD-10-CM | POA: Diagnosis not present

## 2023-04-06 MED ORDER — EMPAGLIFLOZIN 25 MG PO TABS: 25.0000 mg | ORAL_TABLET | Freq: Every day | ORAL | 1 refills | Status: DC

## 2023-04-06 NOTE — Progress Notes (Signed)
 Cardiology Office Note:  .   Date:  04/06/2023  ID:  Carlin Arts, DOB 13-Nov-1945, MRN [id number] PCP: Frederik Charleston, MD  [facility] Providers Cardiologist:  Gordy Bergamo, MD   History of Present Illness: .   Daniel Quinn is a 78 y.o. Caucasian male with history of stroke in 2016 for high-grade left vertebral artery stenosis, hypertension, hyperlipidemia, asymptomatic bilateral carotid artery stenosis, multivessel coronary artery disease with angioplasty to his right coronary artery on 02/05/2016 followed by staged intervention to LAD and proximal and mid circumflex coronary artery on 02/26/2016.  Due to angina pectoris, abnormal nuclear stress test, underwent cardiac catheterization on 05/02/2022 and successful revascularization to the mid LAD and also mid RCA and distal RCA.  He now presents for follow-up.   Discussed the use of AI scribe software for clinical note transcription with the patient, who gave verbal consent to proceed.  History of Present Illness   The patient, with a history of coronary artery disease, hypertension, and hypercholesterolemia, presents for a routine follow-up. He reports feeling well overall and has been maintaining an active lifestyle with regular biking and kayaking. He has lost some weight recently. However, he has been informed by another physician that he may have diabetes. He has undergone some tests, and while the results are not explicitly stated, it is implied that his blood glucose levels are borderline high. He is currently participating in a clinical trial for cholesterol management. He also mentions a past history of stent placement for his coronary artery disease, which has significantly improved his condition.      Labs   Lab Results  Component Value Date   NA 135 05/03/2022   K 3.8 05/03/2022   CO2 23 05/03/2022   GLUCOSE 108 (H) 05/03/2022   BUN 24 (H) 05/03/2022   CREATININE 1.18 05/03/2022   CALCIUM  8.4 (L) 05/03/2022   EGFR 80  04/23/2022   GFRNONAA >60 05/03/2022      Latest Ref Rng & Units 05/03/2022    2:18 AM 04/23/2022   11:15 AM 02/27/2016    2:22 AM  BMP  Glucose 70 - 99 mg/dL 891  896  892   BUN 8 - 23 mg/dL 24  23  23    Creatinine 0.61 - 1.24 mg/dL 8.81  9.00  8.73   BUN/Creat Ratio 10 - 24  23    Sodium 135 - 145 mmol/L 135  140  138   Potassium 3.5 - 5.1 mmol/L 3.8  3.8  4.1   Chloride 98 - 111 mmol/L 103  99  106   CO2 22 - 32 mmol/L 23  25  24    Calcium  8.9 - 10.3 mg/dL 8.4  9.4  8.6       Latest Ref Rng & Units 05/03/2022    2:18 AM 04/23/2022   11:15 AM 02/27/2016    2:22 AM  CBC  WBC 4.0 - 10.5 K/uL 11.2  9.8  8.2   Hemoglobin 13.0 - 17.0 g/dL 87.9  85.7  88.1   Hematocrit 39.0 - 52.0 % 35.0  42.7  34.8   Platelets 150 - 400 K/uL 239  300  194    External Labs:  Labs 02/18/2023:  A1c 6.5%.  TSH normal at 3.32 and free T4 normal.  Serum glucose 118 mg, BUN 23, creatinine 1.19, EGFR 64 mL, potassium 4.2, LFTs normal.  Labs 01/20/2023:  Total cholesterol 148, triglycerides 144, HDL 50, LDL 74.  Research labs 10/01/2022:   BUN 19, creatinine  1.07, alkaline phosphatase mildly elevated at 148, LFT including ALT and AST normal.  Potassium 4.4.   Hb 13.5/HCT 43.0, platelets 242.   Hs-CRP 0.19.  08/11/2022:   Total cholesterol 129, triglycerides 151, HDL 39, LDL 60.  Lp(a) 232.   Urinary albumin to creatinine ratio 3.0.   BUN 19, creatinine 1.16, EGFR 66 mL, potassium 5.1.  LFTs normal.   Hb 13.9/HCT 42.0, platelets 292.   Review of Systems  Cardiovascular:  Negative for chest pain, dyspnea on exertion and leg swelling.   Physical Exam:   VS:  BP 108/62 (BP Location: Left Arm, Patient Position: Sitting, Cuff Size: Large)   Pulse (!) 57   Resp 16   Ht 5' 6 (1.676 m)   Wt 189 lb 9.6 oz (86 kg)   SpO2 94%   BMI 30.60 kg/m    Wt Readings from Last 3 Encounters:  04/06/23 189 lb 9.6 oz (86 kg)  05/19/22 184 lb 12.8 oz (83.8 kg)  05/02/22 180 lb (81.6 kg)    Physical  Exam Neck:     Vascular: No carotid bruit or JVD.  Cardiovascular:     Rate and Rhythm: Normal rate and regular rhythm.     Pulses: Intact distal pulses.     Heart sounds: Normal heart sounds. No murmur heard.    No gallop.  Pulmonary:     Effort: Pulmonary effort is normal.     Breath sounds: Normal breath sounds.  Abdominal:     General: Bowel sounds are normal.     Palpations: Abdomen is soft.  Musculoskeletal:     Right lower leg: No edema.     Left lower leg: No edema.    Studies Reviewed: .    Echocardiogram 04/18/2022: Normal LV systolic function with visual EF 55-60%. Left ventricle cavity is normal in size. Normal left ventricular wall thickness. Normal global wall motion. Normal diastolic filling pattern, normal LAP. Calculated EF 56%. Structurally normal mitral valve.  Mild to moderate mitral regurgitation. Structurally normal tricuspid valve.  Mild tricuspid regurgitation. No evidence of pulmonary hypertension. Compared to 05/2020, MR has slightly progressed.   Left Heart Catheterization 05/02/22:   RCA: Co-Dominant, 3.0 x 18 mm proximal RCA stent and 2.5 x 15 mm Xience Alpine DES in the distal RCA (2017) widely patent.  Mid RCA has a 99% tandem stenosis.  SP 3.0 x 38 mm Synergy XD DES and overlapped with a 2.75 x 16 mm Synergy XD DES, stenosis reduced to 0%. LM: Short, calcified. LCx: Large-caliber vessel.  Previously placed 3.0 x 12 Onyx and a 3.0 x 15 mm Xience Alpine DES (2017) widely patent.  Mild disease in the circumflex.  Codominant. LAD: 3.0 x 18 mm Onyx (2017) widely patent.  Shockwave atherectomy with a 3.0 mm balloon followed by stenting with 3.0 x 38 mm Synergy XD and distally overlapped with a 2.75 x 15 and a 2.75 x 8 mm Onyx frontier DES due to dissection.  Stenosis reduced from 90% to 0%.   Carotid artery duplex 05/22/2022:  Duplex suggests stenosis in the right internal carotid artery (50-69%). <  50% stenosis in the right external carotid artery.   Duplex suggests stenosis in the left internal carotid artery (minimal).  Antegrade right vertebral artery flow. Antegrade left vertebral artery  flow.  Compared to the study done on 05/29/2021, no significant change overall.  Follow up in six months is appropriate if clinically indicated.   EKG:    EKG Interpretation Date/Time:  Monday  April 06 2023 15:31:24 EST Ventricular Rate:  56 PR Interval:  180 QRS Duration:  78 QT Interval:  434 QTC Calculation: 418 R Axis:   36  Text Interpretation: EKG 04/06/2023: Sinus bradycardia at rate of 56 bpm otherwise normal EKG.  Compared to 05/03/2022, T wave inversion in the inferior leads no longer present. Confirmed by Victoriya Pol, Jagadeesh [phone]) on 04/06/2023 3:53:26 PM    EKG 05/19/2022: Normal sinus rhythm at rate of 60 bpm, normal EKG.  Compared to 04/07/2022, no change.  Medications and allergies    Allergies  Allergen Reactions   Brilinta  [Ticagrelor ]     Fever, elevated blood pressure, trembling     Current Outpatient Medications:    Ascorbic Acid  (VITAMIN C ) 1000 MG tablet, Take 1,000 mg by mouth daily., Disp: , Rfl:    aspirin  81 MG chewable tablet, Chew 81 mg by mouth daily., Disp: , Rfl:    atorvastatin  (LIPITOR) 40 MG tablet, Take 1 tablet (40 mg total) by mouth daily at 6 PM., Disp: 30 tablet, Rfl: 0   azelastine  (ASTELIN ) 0.1 % nasal spray, Place 2 sprays into both nostrils at bedtime. Use in each nostril as directed, Disp: , Rfl:    cetirizine  (ZYRTEC ) 10 MG tablet, Take 10 mg by mouth daily., Disp: , Rfl:    empagliflozin  (JARDIANCE ) 25 MG TABS tablet, Take 1 tablet (25 mg total) by mouth daily before breakfast., Disp: 30 tablet, Rfl: 1   EPINEPHrine 0.3 mg/0.3 mL IJ SOAJ injection, Inject 0.3 mg into the muscle as needed for anaphylaxis., Disp: , Rfl:    ezetimibe  (ZETIA ) 10 MG tablet, TAKE 1 TABLET BY MOUTH EVERY DAY IN THE EVENING, Disp: 90 tablet, Rfl: 1   FLUoxetine  (PROZAC ) 20 MG capsule, Take 20 mg by mouth daily., Disp:  , Rfl:    fluticasone  (FLONASE ) 50 MCG/ACT nasal spray, Place 1 spray into both nostrils in the morning., Disp: , Rfl:    Investigational - Study Medication, Study name: Lepodisiran on the Reduction of MACE w/ Elevated Lp(a) in Established CVD or High Risk for CVD (J3L-MC-EZEF-ACCLAIM-Lp(a))  Additional study details: [employer] [phone], Disp: , Rfl:    levothyroxine  (SYNTHROID , LEVOTHROID) 112 MCG tablet, Take 112 mcg by mouth daily before breakfast., Disp: , Rfl:    losartan  (COZAAR ) 25 MG tablet, TAKE 1 TABLET BY MOUTH EVERY EVENING, Disp: 90 tablet, Rfl: 0   Melatonin 10 MG CAPS, Take 1 tablet by mouth at bedtime as needed (Sleep)., Disp: , Rfl:    metoprolol  succinate (TOPROL -XL) 25 MG 24 hr tablet, Take 1 tablet (25 mg total) by mouth daily., Disp: 90 tablet, Rfl: 3   montelukast  (SINGULAIR ) 10 MG tablet, Take 10 mg by mouth at bedtime., Disp: , Rfl:    Multiple Vitamin (MULTIVITAMIN WITH MINERALS) TABS tablet, Take 1 tablet by mouth daily., Disp: , Rfl:    nitroGLYCERIN  (NITROSTAT ) 0.4 MG SL tablet, Place 1 tablet (0.4 mg total) under the tongue every 5 (five) minutes as needed for chest pain., Disp: 25 tablet, Rfl: 3   pantoprazole  (PROTONIX ) 40 MG tablet, Take 1 tablet (40 mg total) by mouth daily., Disp: 90 tablet, Rfl: 0   pregabalin (LYRICA) 25 MG capsule, Take 25 mg by mouth daily., Disp: , Rfl:    ASSESSMENT AND PLAN: .      ICD-10-CM   1. Coronary artery disease of native artery of native heart with stable angina pectoris (HCC)  I25.118 EKG 12-Lead    empagliflozin  (JARDIANCE ) 25 MG TABS tablet  2. Primary hypertension  I10     3. Asymptomatic bilateral carotid artery stenosis  I65.23 VAS US  CAROTID    4. Hypercholesteremia  E78.00     5. Type 2 diabetes mellitus without complication, without long-term current use of insulin (HCC)  E11.9 empagliflozin  (JARDIANCE ) 25 MG TABS tablet    6. Clinical trial participant (Lepodisiran on the Reduction of MACE w/  Elevated Lp(a) in Established CVD or High Risk for CVD (J3L-MC-EZEF-ACCLAIM-Lp(a))09/08/22  Z00.6       1. Coronary artery disease of native artery of native heart with stable angina pectoris [facility]) Patient with stable coronary artery disease, status last angioplasty about a year ago, he has had no recurrence of angina pectoris and has not used any sublingual nitroglycerin .  Presently on metoprolol  and also on losartan , patient is enrolled into Cotrel for reduction in Lp(a).  He has been compliant.  Presently on Lipitor 40 mg along with Zetia  10 mg daily as well.  2. Primary hypertension Blood pressure is well-controlled in fact has been very soft, as I am starting him on Jardiance  for diabetes and also cardiovascular protection, we will go ahead and discontinue amlodipine .  He will continue with metoprolol  succinate 25 mg daily along with losartan  25 mg in the evening.  3. Asymptomatic bilateral carotid artery stenosis He has mild to moderate bilateral carotid artery stenosis, will check carotid artery duplex.  No bruits appreciated today.  4. Hypercholesteremia Lipids are not being measured in view of patient being in the a claim clinical trial.  Overall he seems to be doing well.  He is presently on high intensity statins as well.  No side effects from the clinical trial drug.  I will see him back in a year or sooner if problems.  Assessment and Plan    Diabetes Mellitus HbA1c borderline at 6.5. Currently on Glipizide . Discussed the benefits of Jardiance  (cardioprotective and nephroprotective effects). -Discontinue Glipizide . -Start Jardiance  25mg  daily.  Hypertension Blood pressure noted to be low. -Discontinue Amlodipine .  Coronary Artery Disease Stable since second stent placement. No chest pain reported. -Continue Aspirin . -Discontinue Plavix  after finishing current supply at home.  Hypercholesterolemia Patient is currently in a clinical trial. -Continue current trial  regimen.  Follow-up in 1 year.      Signed,  Gordy Bergamo, MD, [facility] 04/06/2023, 9:14 PM [facility] [address] [place], [place] [address] Phone: [phone]. Fax:  [phone]

## 2023-04-06 NOTE — Patient Instructions (Addendum)
 Medication Instructions:  Your physician has recommended you make the following change in your medication: Start Jardiance  25 mg by mouth daily  Stop amlodipine  Stop Clopidogrel  Stop glipizide   *If you need a refill on your cardiac medications before your next appointment, please call your pharmacy*   Lab Work: none If you have labs (blood work) drawn today and your tests are completely normal, you will receive your results only by: MyChart Message (if you have MyChart) OR A paper copy in the mail If you have any lab test that is abnormal or we need to change your treatment, we will call you to review the results.   Testing/Procedures: Your physician has requested that you have a carotid duplex. This test is an ultrasound of the carotid arteries in your neck. It looks at blood flow through these arteries that supply the brain with blood. Allow one hour for this exam. There are no restrictions or special instructions. This is done at [facility] office.  [address]. [place], [place] [address]      Follow-Up: At [facility], you and your health needs are our priority.  As part of our continuing mission to provide you with exceptional heart care, we have created designated Provider Care Teams.  These Care Teams include your primary Cardiologist (physician) and Advanced Practice Providers (APPs -  Physician Assistants and Nurse Practitioners) who all work together to provide you with the care you need, when you need it.  We recommend signing up for the patient portal called MyChart.  Sign up information is provided on this After Visit Summary.  MyChart is used to connect with patients for Virtual Visits (Telemedicine).  Patients are able to view lab/test results, encounter notes, upcoming appointments, etc.  Non-urgent messages can be sent to your provider as well.   To learn more about what you can do with MyChart, go to [email or web address].    Your next  appointment:   12 month(s)  Provider:   Gordy Bergamo, MD     Other Instructions

## 2023-04-11 ENCOUNTER — Other Ambulatory Visit: Payer: Self-pay | Admitting: Cardiology

## 2023-04-11 DIAGNOSIS — I10 Essential (primary) hypertension: Secondary | ICD-10-CM

## 2023-04-11 DIAGNOSIS — I25118 Atherosclerotic heart disease of native coronary artery with other forms of angina pectoris: Secondary | ICD-10-CM

## 2023-04-16 ENCOUNTER — Telehealth: Payer: Self-pay | Admitting: Cardiology

## 2023-04-16 DIAGNOSIS — E119 Type 2 diabetes mellitus without complications: Secondary | ICD-10-CM

## 2023-04-16 DIAGNOSIS — I25118 Atherosclerotic heart disease of native coronary artery with other forms of angina pectoris: Secondary | ICD-10-CM

## 2023-04-16 NOTE — Telephone Encounter (Signed)
Yes please

## 2023-04-16 NOTE — Telephone Encounter (Signed)
*  STAT* If patient is at the pharmacy, call can be transferred to refill team.   1. Which medications need to be refilled? (please list name of each medication and dose if known)   empagliflozin (JARDIANCE) 25 MG TABS tablet    2. Which pharmacy/location (including street and city if local pharmacy) is medication to be sent to? [id number] - [place], [place] - [address] AT [address] OF [address]   3. Do they need a 30 day or 90 day supply? 90

## 2023-04-16 NOTE — Telephone Encounter (Signed)
Pt is requesting a refill on jardiance. At pt's last office visit Dr. Jacinto Halim stated that pt needed to contact Dr. Abigail Miyamoto for refills going forward. Pt wanted to know if Dr. Jacinto Halim could refill this medication for him for a 90 day supply? Please address

## 2023-04-16 NOTE — Telephone Encounter (Signed)
Spoke with patient and he states the 30 day supply of [place] is more expensive if he gets a 90 day supply. Can we send win 90 day supply or did you want him to follow up with PCP for Jardiance?

## 2023-04-17 ENCOUNTER — Ambulatory Visit (HOSPITAL_COMMUNITY)
Admission: RE | Admit: 2023-04-17 | Discharge: 2023-04-17 | Disposition: A | Discharge status: Home / Self Care | Payer: Medicare HMO | Source: Ambulatory Visit | Attending: Internal Medicine | Admitting: Internal Medicine

## 2023-04-17 DIAGNOSIS — I6523 Occlusion and stenosis of bilateral carotid arteries: Secondary | ICD-10-CM | POA: Insufficient documentation

## 2023-04-18 ENCOUNTER — Encounter: Payer: Self-pay | Admitting: Cardiology

## 2023-04-20 MED ORDER — EMPAGLIFLOZIN 25 MG PO TABS: 25.0000 mg | ORAL_TABLET | Freq: Every day | ORAL | 3 refills | Status: DC

## 2023-04-20 NOTE — Telephone Encounter (Signed)
Left voicemail to return call to office  90 day supply sent to pharmacy

## 2023-04-22 ENCOUNTER — Telehealth: Payer: Self-pay | Admitting: Cardiology

## 2023-04-22 NOTE — Telephone Encounter (Signed)
Pt c/o medication issue:  1. Name of Medication: empagliflozin (JARDIANCE) 25 MG TABS tablet   2. How are you currently taking this medication (dosage and times per day)?   3. Are you having a reaction (difficulty breathing--STAT)?   4. What is your medication issue? Patient is requesting this medication be called in for a 90 day supple to save on cost. Requesting call back to confirm this will be okay to do.

## 2023-04-22 NOTE — Telephone Encounter (Signed)
Spoke with the patient and advised that his prescription was already sent in for a 90 day supply.

## 2023-04-22 NOTE — Telephone Encounter (Signed)
I spoke with patient and reviewed results with him

## 2023-04-23 ENCOUNTER — Ambulatory Visit (INDEPENDENT_AMBULATORY_CARE_PROVIDER_SITE_OTHER): Payer: 59 | Admitting: Dermatology

## 2023-04-23 VITALS — BP 116/72

## 2023-04-23 DIAGNOSIS — L570 Actinic keratosis: Secondary | ICD-10-CM

## 2023-04-23 DIAGNOSIS — Z5111 Encounter for antineoplastic chemotherapy: Secondary | ICD-10-CM | POA: Diagnosis not present

## 2023-04-23 DIAGNOSIS — D0439 Carcinoma in situ of skin of other parts of face: Secondary | ICD-10-CM | POA: Diagnosis not present

## 2023-04-23 MED ORDER — FLUOROURACIL 5 % EX CREA: TOPICAL_CREAM | CUTANEOUS | 1 refills | Status: AC

## 2023-04-23 MED ORDER — TRIAMCINOLONE ACETONIDE 0.025 % EX OINT: TOPICAL_OINTMENT | CUTANEOUS | 1 refills | Status: AC

## 2023-04-23 NOTE — Patient Instructions (Signed)
Hello [name],  Thank you for visiting [place] today.  Here is a summary of the key instructions and next steps from today's consultation:  Surgical Consultation: You are scheduled with Dr. Caralyn Guile, our Mohs surgeon, for the complete removal of the skin cancer on the right forehead.  Current Treatment: Some areas were treated with liquid nitrogen today to freeze the rough, gritty papules.  Medication for Pre-Cancer:   Efudex: Apply a thin layer to your arms and hands twice a day for two weeks. Begin this treatment one week after today to allow for healing from the freezing procedure.   Triamcinolone: After completing two weeks of Efudex, switch to this steroid cream to reduce inflammation and aid in healing.  Post-Treatment Care: Apply Aquaphor Healing Ointment to the spots treated with liquid nitrogen today.  Protection: It is important to wear long sleeves and sunscreen, especially given your kayaking activities.  Follow-Up: A full-body check has been scheduled for you in July.  The prescriptions have been sent to your regular pharmacy for your convenience. Please follow the instructions carefully, and do not hesitate to contact us if you experience excessive irritation or have any concerns.  Thank you once again for your proactive approach to your health.  Best regards,  Dr. [facility]   Important Information  Due to recent changes in healthcare laws, you may see results of your pathology and/or laboratory studies on MyChart before the doctors have had a chance to review them. We understand that in some cases there may be results that are confusing or concerning to you. Please understand that not all results are received at the same time and often the doctors may need to interpret multiple results in order to provide you with the best plan of care or course of treatment. Therefore, we ask that you please give [place] 2 business days to thoroughly review all your results  before contacting the office for clarification. Should we see a critical lab result, you will be contacted sooner.   If You Need Anything After Your Visit  If you have any questions or concerns for your doctor, please call our main line at [phone] If no one answers, please leave a voicemail as directed and we will return your call as soon as possible. Messages left after 4 pm will be answered the following business day.   You may also send [place] a message via MyChart. We typically respond to MyChart messages within 1-2 business days.  For prescription refills, please ask your pharmacy to contact our office. Our fax number is [phone].  If you have an urgent issue when the clinic is closed that cannot wait until the next business day, you can page your doctor at the number below.    Please note that while we do our best to be available for urgent issues outside of office hours, we are not available 24/7.   If you have an urgent issue and are unable to reach [place], you may choose to seek medical care at your doctor's office, retail clinic, urgent care center, or emergency room.  If you have a medical emergency, please immediately call 911 or go to the emergency department. In the event of inclement weather, please call our main line at [phone] for an update on the status of any delays or closures.  Dermatology Medication Tips: Please keep the boxes that topical medications come in in order to help keep track of the instructions about where and how to use these.  Pharmacies typically print the medication instructions only on the boxes and not directly on the medication tubes.   If your medication is too expensive, please contact our office at [phone] or send [place] a message through MyChart.   We are unable to tell what your co-pay for medications will be in advance as this is different depending on your insurance coverage. However, we may be able to find a substitute medication at  lower cost or fill out paperwork to get insurance to cover a needed medication.   If a prior authorization is required to get your medication covered by your insurance company, please allow [place] 1-2 business days to complete this process.  Drug prices often vary depending on where the prescription is filled and some pharmacies may offer cheaper prices.  The website www.goodrx.com contains coupons for medications through different pharmacies. The prices here do not account for what the cost may be with help from insurance (it may be cheaper with your insurance), but the website can give you the price if you did not use any insurance.  - You can print the associated coupon and take it with your prescription to the pharmacy.  - You may also stop by our office during regular business hours and pick up a GoodRx coupon card.  - If you need your prescription sent electronically to a different pharmacy, notify our office through [facility] or by phone at [phone]

## 2023-04-23 NOTE — Progress Notes (Signed)
   Follow-Up Visit   Subjective  Daniel Quinn is a 78 y.o. male who presents for the following: AK follow up of bilateral forearms treated with LN2.  He also has a biopsy proven SCC of right forehead that is not treated yet.    The following portions of the chart were reviewed this encounter and updated as appropriate: medications, allergies, medical history  Review of Systems:  No other skin or systemic complaints except as noted in HPI or Assessment and Plan.  Objective  Well appearing patient in no apparent distress; mood and affect are within normal limits.   A focused examination was performed of the following areas: Right forehead and bilateral forearms  Relevant exam findings are noted in the Assessment and Plan.    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  Plan:   Refer to Dr. Caralyn Guile (Mohs surgeon) for complete removal.   Schedule Mohs surgery before the patient leaves the office.   Discuss field therapy with Efudex after surgery to prevent further skin cancers.  Actinic Keratoses Assessment: Multiple actinic keratoses, including a biopsy-confirmed pre-cancerous lesion. Presence of large, rough, gritty papules requiring treatment.  Plan:   Perform cryotherapy with liquid nitrogen for larger rough, gritty papules.   Prescribe Efudex (fluorouracil) for field therapy on arms and hands:     Apply a thin layer twice daily for 2 weeks, starting 1 week after cryotherapy to allow for healing.     If excessive irritation occurs, treatment may be stopped after 1 week.   Prescribe triamcinolone (steroid cream) to reduce inflammation and speed healing post-Efudex treatment.   Patient education:     Advise wearing long sleeves and sunscreen during treatment.     Instruct to apply Aquaphor Healing Ointment to areas treated with  cryotherapy.  Follow-up: Schedule a full-body check in July. AK (ACTINIC KERATOSIS) (29) right side of face x 6, right forehead x3 , left forehead x 4, left preauricular x 4, left ear 1, left jaw x 2, left chest  x1, left forearm x 3, right hand x 2, right forearm x 3 (29)  No follow-ups on file.  Owens Shark, CMA, am acting as scribe for [employer], DO.   Documentation: I have reviewed the above documentation for accuracy and completeness, and I agree with the above.  Daniel Reusing, DO

## 2023-04-25 NOTE — Progress Notes (Signed)
Very mild carotid disease, Do not think you need further studies

## 2023-04-28 ENCOUNTER — Encounter: Payer: Self-pay | Admitting: Dermatology

## 2023-04-29 ENCOUNTER — Encounter: Payer: 59 | Admitting: Dermatology

## 2023-04-30 ENCOUNTER — Telehealth: Payer: Self-pay | Admitting: Dermatology

## 2023-04-30 NOTE — Telephone Encounter (Signed)
 Patient was scheduled for 04/29/23 for Mohs Surgery , patient has the flu and will reschedule when better.

## 2023-05-02 ENCOUNTER — Emergency Department (HOSPITAL_COMMUNITY): Payer: 59

## 2023-05-02 ENCOUNTER — Emergency Department (HOSPITAL_COMMUNITY)
Admission: EM | Admit: 2023-05-02 | Discharge: 2023-05-02 | Disposition: A | Discharge status: Home / Self Care | Payer: 59 | Attending: Emergency Medicine | Admitting: Emergency Medicine

## 2023-05-02 ENCOUNTER — Other Ambulatory Visit: Payer: Self-pay

## 2023-05-02 ENCOUNTER — Encounter (HOSPITAL_COMMUNITY): Payer: Self-pay

## 2023-05-02 DIAGNOSIS — Z7982 Long term (current) use of aspirin: Secondary | ICD-10-CM | POA: Insufficient documentation

## 2023-05-02 DIAGNOSIS — I2510 Atherosclerotic heart disease of native coronary artery without angina pectoris: Secondary | ICD-10-CM | POA: Insufficient documentation

## 2023-05-02 DIAGNOSIS — Z85828 Personal history of other malignant neoplasm of skin: Secondary | ICD-10-CM | POA: Insufficient documentation

## 2023-05-02 DIAGNOSIS — J069 Acute upper respiratory infection, unspecified: Secondary | ICD-10-CM

## 2023-05-02 DIAGNOSIS — J101 Influenza due to other identified influenza virus with other respiratory manifestations: Secondary | ICD-10-CM | POA: Insufficient documentation

## 2023-05-02 DIAGNOSIS — J111 Influenza due to unidentified influenza virus with other respiratory manifestations: Secondary | ICD-10-CM

## 2023-05-02 DIAGNOSIS — R051 Acute cough: Secondary | ICD-10-CM

## 2023-05-02 DIAGNOSIS — R0981 Nasal congestion: Secondary | ICD-10-CM | POA: Diagnosis present

## 2023-05-02 LAB — CBC
HCT: 38.3 % — ABNORMAL LOW (ref 39.0–52.0)
Hemoglobin: 13.3 g/dL (ref 13.0–17.0)
MCH: 33.3 pg (ref 26.0–34.0)
MCHC: 34.7 g/dL (ref 30.0–36.0)
MCV: 95.8 fL (ref 80.0–100.0)
Platelets: 263 10*3/uL (ref 150–400)
RBC: 4 MIL/uL — ABNORMAL LOW (ref 4.22–5.81)
RDW: 12.3 % (ref 11.5–15.5)
WBC: 15.7 10*3/uL — ABNORMAL HIGH (ref 4.0–10.5)
nRBC: 0 % (ref 0.0–0.2)

## 2023-05-02 LAB — COMPREHENSIVE METABOLIC PANEL
ALT: 117 U/L — ABNORMAL HIGH (ref 0–44)
AST: 76 U/L — ABNORMAL HIGH (ref 15–41)
Albumin: 2.5 g/dL — ABNORMAL LOW (ref 3.5–5.0)
Alkaline Phosphatase: 96 U/L (ref 38–126)
Anion gap: 10 (ref 5–15)
BUN: 18 mg/dL (ref 8–23)
CO2: 24 mmol/L (ref 22–32)
Calcium: 8.6 mg/dL — ABNORMAL LOW (ref 8.9–10.3)
Chloride: 104 mmol/L (ref 98–111)
Creatinine, Ser: 1.12 mg/dL (ref 0.61–1.24)
GFR, Estimated: >60 mL/min (ref >60)
Glucose, Bld: 123 mg/dL — ABNORMAL HIGH (ref 70–99)
Potassium: 4.5 mmol/L (ref 3.5–5.1)
Sodium: 138 mmol/L (ref 135–145)
Total Bilirubin: 0.6 mg/dL (ref 0.0–1.2)
Total Protein: 6.5 g/dL (ref 6.5–8.1)

## 2023-05-02 LAB — URINALYSIS, ROUTINE W REFLEX MICROSCOPIC
Bacteria, UA: NONE SEEN
Bilirubin Urine: NEGATIVE
Glucose, UA: >=500 mg/dL — AB
Hgb urine dipstick: NEGATIVE
Ketones, ur: NEGATIVE mg/dL
Leukocytes,Ua: NEGATIVE
Nitrite: NEGATIVE
Protein, ur: NEGATIVE mg/dL
Specific Gravity, Urine: 1.018 (ref 1.005–1.030)
pH: 7 (ref 5.0–8.0)

## 2023-05-02 LAB — TROPONIN I (HIGH SENSITIVITY)
Troponin I (High Sensitivity): 14 ng/L (ref <18)
Troponin I (High Sensitivity): 13 ng/L (ref <18)

## 2023-05-02 LAB — LIPASE, BLOOD: Lipase: 28 U/L (ref 11–51)

## 2023-05-02 MED ORDER — SODIUM CHLORIDE 0.9 % IV BOLUS
1000.0000 mL | Freq: Once | INTRAVENOUS | Status: AC
  Administered 2023-05-02: 1000 mL via INTRAVENOUS

## 2023-05-02 MED ORDER — ALBUTEROL SULFATE HFA 108 (90 BASE) MCG/ACT IN AERS
2.0000 | INHALATION_SPRAY | Freq: Once | RESPIRATORY_TRACT | Status: AC
  Administered 2023-05-02: 2 via RESPIRATORY_TRACT
  Filled 2023-05-02: qty 6.7

## 2023-05-02 MED ORDER — ONDANSETRON 4 MG PO TBDP: 4.0000 mg | ORAL_TABLET | Freq: Three times a day (TID) | ORAL | 0 refills | Status: AC | PRN

## 2023-05-02 MED ORDER — ONDANSETRON HCL 4 MG/2ML IJ SOLN
4.0000 mg | Freq: Once | INTRAMUSCULAR | Status: AC
  Administered 2023-05-02: 4 mg via INTRAVENOUS
  Filled 2023-05-02: qty 2

## 2023-05-02 NOTE — ED Triage Notes (Signed)
 BIB EMS from home for general unwell feeling for a few days. Pt diagnosed with flu A 5 days ago. Vomiting with poor PO intake. Denies diarrhea.

## 2023-05-02 NOTE — Discharge Instructions (Signed)
 Your history, exam, and workup today did not show evidence of pneumonia or other critical abnormality.  I do suspect that you are still getting over the influenza infection they found the other day.  Given your reassuring vital signs and improvement after some albuterol , we feel you are safe for discharge home.  Please take the prescription of nausea medicine and follow-up with a primary doctor.  If any symptoms change or worsen acutely, please return for the nearest emergency department.

## 2023-05-02 NOTE — ED Notes (Signed)
 Ambulated pt in hall. Tolerated well. 02 sat 95-97%.

## 2023-05-02 NOTE — ED Provider Notes (Signed)
 [facility] Quinn Provider Note   CSN: [id number] Arrival date & time: 05/02/23  1411     History  Chief Complaint  Patient presents with   Generalized Body Aches    Daniel Quinn is a 78 y.o. male.  The history is provided by the patient, medical records and a relative. No language interpreter was used.  Illness Location:  URI with congestion, cough, chest discomfort, shortness of breath, nausea, vomiting, fatigue and malaise Severity:  Severe Onset quality:  Gradual Duration:  5 days Timing:  Constant Progression:  Waxing and waning Chronicity:  New Associated symptoms: chest pain, congestion, cough, fatigue, fever, myalgias, nausea, shortness of breath, vomiting and wheezing   Associated symptoms: no abdominal pain, no diarrhea, no headaches and no loss of consciousness        Home Medications Prior to Admission medications   Medication Sig Start Date End Date Taking? Authorizing Provider  Ascorbic Acid  (VITAMIN C ) 1000 MG tablet Take 1,000 mg by mouth daily.    [provider]  aspirin  81 MG chewable tablet Chew 81 mg by mouth daily.    [provider]  atorvastatin  (LIPITOR) 40 MG tablet Take 1 tablet (40 mg total) by mouth daily at 6 PM. 07/04/14   Singh, Prashant K, MD  azelastine  (ASTELIN ) 0.1 % nasal spray Place 2 sprays into both nostrils at bedtime. Use in each nostril as directed    [provider]  cetirizine  (ZYRTEC ) 10 MG tablet Take 10 mg by mouth daily.    [provider]  empagliflozin  (JARDIANCE ) 25 MG TABS tablet Take 1 tablet (25 mg total) by mouth daily before breakfast. 04/20/23 04/19/24  Ladona Heinz, MD  EPINEPHrine 0.3 mg/0.3 mL IJ SOAJ injection Inject 0.3 mg into the muscle as needed for anaphylaxis.    [provider]  ezetimibe  (ZETIA ) 10 MG tablet TAKE 1 TABLET BY MOUTH EVERY DAY IN THE EVENING 02/16/23   Ladona Heinz, MD  fluorouracil  (EFUDEX ) 5 % cream Apply to  arms and hands twice a day for 2 weeks 04/23/23   Alm Delon SAILOR, DO  FLUoxetine  (PROZAC ) 20 MG capsule Take 20 mg by mouth daily.    [provider]  fluticasone  (FLONASE ) 50 MCG/ACT nasal spray Place 1 spray into both nostrils in the morning.    [provider]  Investigational - Study Medication Study name: Lepodisiran on the Reduction of MACE w/ Elevated Lp(a) in Established CVD or High Risk for CVD ([id number]-ACCLAIM-Lp(a))  Additional study details: Medication Management Quinn [phone]    [provider]  levothyroxine  (SYNTHROID , LEVOTHROID) 112 MCG tablet Take 112 mcg by mouth daily before breakfast.    [provider]  losartan  (COZAAR ) 25 MG tablet TAKE 1 TABLET BY MOUTH EVERY EVENING 03/13/23   Ladona Heinz, MD  Melatonin 10 MG CAPS Take 1 tablet by mouth at bedtime as needed (Sleep).    [provider]  metoprolol  succinate (TOPROL -XL) 25 MG 24 hr tablet Take 1 tablet (25 mg total) by mouth daily. 11/18/22   Ladona Heinz, MD  montelukast  (SINGULAIR ) 10 MG tablet Take 10 mg by mouth at bedtime. 08/15/19   [provider]  Multiple Vitamin (MULTIVITAMIN WITH MINERALS) TABS tablet Take 1 tablet by mouth daily.    [provider]  nitroGLYCERIN  (NITROSTAT ) 0.4 MG SL tablet Place 1 tablet (0.4 mg total) under the tongue every 5 (five) minutes as needed for chest pain. 04/01/22   Ladona Heinz, MD  pantoprazole  (PROTONIX ) 40 MG tablet Take 1 tablet (40 mg total) by mouth daily. 05/02/22   Ladona Heinz, MD  pregabalin (LYRICA) 25 MG capsule Take 25 mg by mouth daily.    [provider]  triamcinolone  (KENALOG ) 0.025 % ointment Apply to arms twice a day for 2 weeks after finishing efudex  04/23/23   Alm Delon SAILOR, DO      Allergies    Brilinta  [ticagrelor ]    Review of Systems   Review of Systems  Constitutional:  Positive for chills, fatigue and fever.  HENT:  Positive for congestion.   Respiratory:  Positive for cough,  chest tightness, shortness of breath and wheezing.   Cardiovascular:  Positive for chest pain. Negative for palpitations and leg swelling.  Gastrointestinal:  Positive for nausea and vomiting. Negative for abdominal pain, constipation and diarrhea.  Genitourinary:  Negative for dysuria.  Musculoskeletal:  Positive for myalgias. Negative for back pain.  Neurological:  Negative for loss of consciousness and headaches.  Psychiatric/Behavioral:  Negative for agitation.   All other systems reviewed and are negative.   Physical Exam Updated Vital Signs BP (!) 144/72   Pulse (!) 105   Temp 98.3 F (36.8 C) (Oral)   Resp 18   Ht 5' 6 (1.676 m)   Wt 83.9 kg   SpO2 94%   BMI 29.86 kg/m  Physical Exam Vitals and nursing note reviewed.  Constitutional:      General: He is not in acute distress.    Appearance: He is well-developed. He is not ill-appearing, toxic-appearing or diaphoretic.  HENT:     Head: Normocephalic and atraumatic.     Nose: No congestion or rhinorrhea.     Mouth/Throat:     Mouth: Mucous membranes are dry.     Pharynx: No oropharyngeal exudate or posterior oropharyngeal erythema.  Eyes:     Extraocular Movements: Extraocular movements intact.     Conjunctiva/sclera: Conjunctivae normal.     Pupils: Pupils are equal, round, and reactive to light.  Cardiovascular:     Rate and Rhythm: Normal rate and regular rhythm.     Pulses: Normal pulses.     Heart sounds: No murmur heard. Pulmonary:     Effort: Pulmonary effort is normal. No respiratory distress.     Breath sounds: Wheezing and rhonchi present. No rales.  Chest:     Chest wall: No tenderness.  Abdominal:     General: Abdomen is flat.     Palpations: Abdomen is soft.     Tenderness: There is no abdominal tenderness. There is no right CVA tenderness, left CVA tenderness, guarding or rebound.  Musculoskeletal:        General: No swelling or tenderness.     Cervical back: Neck supple. No tenderness.      Right lower leg: No edema.     Left lower leg: No edema.  Skin:    General: Skin is warm and dry.     Capillary Refill: Capillary refill takes less than 2 seconds.     Findings: No erythema or rash.  Neurological:     Mental Status: He is alert.  Psychiatric:        Mood and Affect: Mood normal.     ED Results / Procedures / Treatments   Labs (all labs ordered are listed, but only abnormal results are displayed) Labs Reviewed  COMPREHENSIVE METABOLIC PANEL - Abnormal; Notable for the following components:      Result Value   Glucose, Bld 123 (*)  Calcium  8.6 (*)    Albumin 2.5 (*)    AST 76 (*)    ALT 117 (*)    All other components within normal limits  CBC - Abnormal; Notable for the following components:   WBC 15.7 (*)    RBC 4.00 (*)    HCT 38.3 (*)    All other components within normal limits  URINALYSIS, ROUTINE W REFLEX MICROSCOPIC - Abnormal; Notable for the following components:   Glucose, UA >=500 (*)    All other components within normal limits  LIPASE, BLOOD  TROPONIN I (HIGH SENSITIVITY)  TROPONIN I (HIGH SENSITIVITY)    EKG EKG Interpretation Date/Time:  Saturday May 02 2023 16:43:27 EST Ventricular Rate:  87 PR Interval:  166 QRS Duration:  79 QT Interval:  383 QTC Calculation: 461 R Axis:   54  Text Interpretation: Sinus rhythm when compared to prior, overall similar appearance. No STEMI Confirmed by Ginger Barefoot ([id number]) on 05/02/2023 5:28:41 PM  Radiology DG Chest Portable 1 View Result Date: 05/02/2023 CLINICAL DATA:  Cough, influenza positive, short of breath and chest pain EXAM: PORTABLE CHEST 1 VIEW COMPARISON:  07/02/2014 FINDINGS: The heart size and mediastinal contours are within normal limits. Both lungs are clear. The visualized skeletal structures are unremarkable. IMPRESSION: No active disease. Electronically Signed   By: Ozell Daring M.D.   On: 05/02/2023 16:29    Procedures Procedures    Medications Ordered in  ED Medications  sodium chloride  0.9 % bolus 1,000 mL (0 mLs Intravenous Stopped 05/02/23 1848)  ondansetron  (ZOFRAN ) injection 4 mg (4 mg Intravenous Given 05/02/23 1647)  albuterol  (VENTOLIN  HFA) 108 (90 Base) MCG/ACT inhaler 2 puff (2 puffs Inhalation Given 05/02/23 1648)    ED Course/ Medical Decision Making/ A&P                                 Medical Decision Making Amount and/or Complexity of Data Reviewed Labs: ordered. Radiology: ordered.  Risk Prescription drug management.    Terius Jacuinde is a 78 y.o. male with past medical history significant for previous stroke, depression, hyperlipidemia, CAD with previous PCI, and previous skin cancers who was diagnosed with influenza 5 days ago and is having worsening feelings of dehydration with nausea, vomiting, fatigue, legs, chest tightness, shortness of breath, and chills.  According to patient, patient has not been feeling better over the last 5 days and is still having coughing.  He denies a productive cough but is having some chest tightness and shortness of breath.  He reports nausea and vomiting is not keeping much down.  He thinks getting dehydrated.  He reports no constipation or diarrhea and denies urinary changes.  Denies leg pain or leg swelling.  Denies any trauma.  He reports no abdominal pain whatsoever but just had some chest discomfort with his coughing.  He denies pleuritic discomfort initially.  Reports the pain is mild to moderate.  Says that he has been taking his Tamiflu and is still symptomatic.  On exam, lungs do have some wheezing and rhonchi.  He reports he does not use no butyryl inhaler.  He did not have chest tenderness on exam.  No abdominal tenderness including no tenderness in the right upper quadrant.  Back and flanks nontender.  Legs nontender nonedematous.  Patient resting but is having coughing.  Clinically I suspect he is still symptomatic from his influenza however with this worsening cough and his exam  findings with the rhonchi and wheezing, will get chest x-ray to rule out postviral pneumonia developing.  We will get a EKG and put him on a cardiac monitor and get troponin given his history of CAD with multiple stents and this chest discomfort today.  Will get screening labs and will give him some nausea medicine and fluids with the concern for dehydration.  Patient's labs began to return from triage.  His white count has worsened to 15.7.  Hemoglobin is normal.  Urinalysis does not show UTI.  Lipase not elevated.  Metabolic panel shows his liver function has elevated but he did not have a tenderness on exam.  Doubt acute cholecystitis given his lack of abdominal pain lipase was normal.  Will get the troponins, chest x-ray, and cardiac workup and give him the nausea medicine to try to feel better.  Will give fluids.  Will also check pulse oximetry with ambulation x-ray is not hypoxic.  Anticipate reassessment after workup.  SABRA 7:03 PM Patient is feeling better after some fluids.  Initial troponin is normal, will trend.  X-ray does not show pneumonia and lipase is not elevated.  If delta troponin is negative and he passed p.o. challenge, plan for discharge home to take the inhaler with him and will give prescription for nausea medicine as he continues to get over the influenza infection he now has.  7:40 PM Second troponin was normal and patient is feeling well.  Will discharge per previous plan and his oxygen saturations remain normal.  Patient will get prescription for Zofran  and will follow-up with PCP.  He agreed with plan of care and return precautions and patient was discharged in good condition.          Final Clinical Impression(s) / ED Diagnoses Final diagnoses:  Influenza  Upper respiratory tract infection, unspecified type  Acute cough    Rx / DC Orders ED Discharge Orders          Ordered    ondansetron  (ZOFRAN -ODT) 4 MG disintegrating tablet  Every 8 hours PRN         05/02/23 1944            Clinical Impression: 1. Influenza   2. Upper respiratory tract infection, unspecified type   3. Acute cough     Disposition: Admit  This note was prepared with assistance of Dragon voice recognition software. Occasional wrong-word or sound-a-like substitutions may have occurred due to the inherent limitations of voice recognition software.      Benford Asch, Lonni PARAS, MD 05/02/23 [phone]

## 2023-05-05 ENCOUNTER — Encounter: Payer: Self-pay | Admitting: Dermatology

## 2023-05-20 ENCOUNTER — Ambulatory Visit (INDEPENDENT_AMBULATORY_CARE_PROVIDER_SITE_OTHER): Payer: 59 | Admitting: Dermatology

## 2023-05-20 ENCOUNTER — Encounter: Payer: Self-pay | Admitting: Dermatology

## 2023-05-20 VITALS — BP 113/64 | HR 68 | Temp 98.2°F

## 2023-05-20 DIAGNOSIS — D0439 Carcinoma in situ of skin of other parts of face: Secondary | ICD-10-CM | POA: Diagnosis not present

## 2023-05-20 DIAGNOSIS — W908XXA Exposure to other nonionizing radiation, initial encounter: Secondary | ICD-10-CM | POA: Diagnosis not present

## 2023-05-20 DIAGNOSIS — L579 Skin changes due to chronic exposure to nonionizing radiation, unspecified: Secondary | ICD-10-CM

## 2023-05-20 DIAGNOSIS — C4492 Squamous cell carcinoma of skin, unspecified: Secondary | ICD-10-CM

## 2023-05-20 DIAGNOSIS — L814 Other melanin hyperpigmentation: Secondary | ICD-10-CM

## 2023-05-20 DIAGNOSIS — Z5111 Encounter for antineoplastic chemotherapy: Secondary | ICD-10-CM | POA: Diagnosis not present

## 2023-05-20 DIAGNOSIS — L570 Actinic keratosis: Secondary | ICD-10-CM

## 2023-05-20 MED ORDER — FLUOROURACIL 5 % EX CREA: TOPICAL_CREAM | Freq: Two times a day (BID) | CUTANEOUS | 1 refills | Status: AC

## 2023-05-20 NOTE — Patient Instructions (Addendum)
Efudex Instructions 1. Apply a thin layer of Efudex cream to the specified sun  damaged area twice a day. The medication is to be applied to the entire  area, not just the keratoses (rough spots). Wash hands thoroughly after  application! 2. Use Efudex twice daily for 2 weeks to 4 weeks (depending on the  physician directions) or once a day for 4 weeks (depending on the  physician directions).  3. The skin will become red and scaly. This is the appropriate response.  4. Once redness starts, you may begin using Hydrocortisone 1% Cream (or  other topical steroid provided by the physician) twice a day. This helps  relieve pain and will speed the healing process. It may be applied as soon  as 30 minutes after Efudex application. 5. For excessive reactions or if suspicious for an infection or allergic  reaction, please call to make an appointment at my office so that I may  evaluate your response to treatment. 6. The area may be washed as you normally would with soap and water. Do  not wash the face for at least three hours after Efudex  application. 7. Ladies: Daniel Quinn is permitted throughout the entire treatment period. Wait  30 minutes after Efudex use before applying makeup directly on top of the  medication. 8. Please read the instructional pamphlet provided by the [employer] so that you may familiarize yourself with the kinds of reactions  you may experience. 9. If you have had a biopsy on any area that you will use the medication on,  DO NOT start the Efudex until your results are given to you and  the medical assistant lets you know it is okay to start the treatment   Important Information   Due to recent changes in healthcare laws, you may see results of your pathology and/or laboratory studies on MyChart before the doctors have had a chance to review them. We understand that in some cases there may be results that are confusing or concerning to you. Please understand that  not all results are received at the same time and often the doctors may need to interpret multiple results in order to provide you with the best plan of care or course of treatment. Therefore, we ask that you please give [place] 2 business days to thoroughly review all your results before contacting the office for clarification. Should we see a critical lab result, you will be contacted sooner.     If You Need Anything After Your Visit   If you have any questions or concerns for your doctor, please call our main line at [phone]. If no one answers, please leave a voicemail as directed and we will return your call as soon as possible. Messages left after 4 pm will be answered the following business day.    You may also send [place] a message via MyChart. We typically respond to MyChart messages within 1-2 business days.  For prescription refills, please ask your pharmacy to contact our office. Our fax number is [phone].  If you have an urgent issue when the clinic is closed that cannot wait until the next business day, you can page your doctor at the number below.     Please note that while we do our best to be available for urgent issues outside of office hours, we are not available 24/7.    If you have an urgent issue and are unable to reach [place], you may choose to seek medical  care at your doctor's office, retail clinic, urgent care center, or emergency room.   If you have a medical emergency, please immediately call 911 or go to the emergency department. In the event of inclement weather, please call our main line at [phone] for an update on the status of any delays or closures.  Dermatology Medication Tips: Please keep the boxes that topical medications come in in order to help keep track of the instructions about where and how to use these. Pharmacies typically print the medication instructions only on the boxes and not directly on the medication tubes.   If your medication is too  expensive, please contact our office at [phone] or send [place] a message through MyChart.    We are unable to tell what your co-pay for medications will be in advance as this is different depending on your insurance coverage. However, we may be able to find a substitute medication at lower cost or fill out paperwork to get insurance to cover a needed medication.    If a prior authorization is required to get your medication covered by your insurance company, please allow [place] 1-2 business days to complete this process.   Drug prices often vary depending on where the prescription is filled and some pharmacies may offer cheaper prices.   The website www.goodrx.com contains coupons for medications through different pharmacies. The prices here do not account for what the cost may be with help from insurance (it may be cheaper with your insurance), but the website can give you the price if you did not use any insurance.  - You can print the associated coupon and take it with your prescription to the pharmacy.  - You may also stop by our office during regular business hours and pick up a GoodRx coupon card.  - If you need your prescription sent electronically to a different pharmacy, notify our office through [facility] or by phone at [phone]

## 2023-05-20 NOTE — Progress Notes (Signed)
 Follow-Up Visit   Subjective  Rex Oesterle is a 78 y.o. 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 Onalee Hua.  He also has a substantial portion of his face covered with pink scaly plaques, present for years, not previously treated.  The following portions of the chart were reviewed this encounter and updated as appropriate: medications, allergies, medical history  Review of Systems:  No other skin or systemic complaints except as noted in HPI or Assessment and Plan.  Objective  Well appearing patient in no apparent distress; mood and affect are within normal limits.  A focused examination was performed of the following areas: Right forehead Relevant physical exam findings are noted in the Assessment and Plan.   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 No    Anesthesia: Anesthesia method: local infiltration Local anesthetic: lidocaine 1% WITH epi  Procedure Details: Timeout: pre-procedure verification complete Procedure Prep: patient was prepped and draped in usual sterile fashion Prep type: chlorhexidine Pre-Op diagnosis: squamous cell carcinoma SCC subtype: in situ MohsAIQ Surgical site (if tumor spans multiple areas, please select predominant area): forehead (non-eyebrow) Surgery side: right Surgical site (from skin exam): Right Forehead Pre-operative length (cm): 1.2 Pre-operative width (cm): 0.5 Indications for Mohs surgery: anatomic location where tissue conservation is critical and ill-defined borders  Micrographic Surgery Details: Post-operative length (cm): 1.8 Post-operative width (cm): 1.2  Reconstruction: Was the defect reconstructed? Yes   Was reconstruction performed by the same Mohs surgeon? Yes    Skin  repair Complexity:  Complex Final length (cm):  4.2 Informed consent: discussed and consent obtained   Timeout: patient name, date of birth, surgical site, and procedure verified   Procedure prep:  Patient was prepped and draped in usual sterile fashion Prep type:  Chlorhexidine Anesthesia: the lesion was anesthetized in a standard fashion   Anesthetic:  1% lidocaine w/ epinephrine 1-100,000 buffered w/ 8.4% NaHCO3 Reason for type of repair: reduce tension to allow closure, preserve normal anatomy, preserve normal anatomical and functional relationships, allow side-to-side closure without requiring a flap or graft and compensate for the inelasticity of skin in this area   Undermining: area extensively undermined   Subcutaneous layers (deep stitches):  Suture size:  5-0 Suture type: Monocryl (poliglecaprone 25)   Stitches:  Buried vertical mattress Fine/surface layer approximation (top stitches):  Suture size:  6-0 Suture type: fast-absorbing plain gut   Stitches: simple running   Hemostasis achieved with: suture, pressure and electrodesiccation Outcome: patient tolerated procedure well with no complications   Post-procedure details: sterile dressing applied and wound care instructions given   Dressing type: pressure dressing and bandage    ACTINIC KERATOSIS Exam: Erythematous thin papules/macules with gritty scale at the face  Actinic keratoses are precancerous spots that appear secondary to cumulative UV radiation exposure/sun exposure over time. They are chronic with expected duration over 1 year. A portion of actinic keratoses will progress to squamous cell carcinoma of the skin. It is not possible to reliably predict which spots will progress to skin cancer and so treatment is recommended to prevent development of skin cancer.  Recommend daily broad spectrum sunscreen SPF 30+ to sun-exposed areas, reapply every 2 hours as needed.  Recommend staying in the shade or wearing long  sleeves, sun glasses (UVA+UVB protection) and wide  brim hats (4-inch brim around the entire circumference of the hat). Call for new or changing lesions.  Treatment Plan: Start 5-fluorouracil cream twice a day for 14 days to affected areas including face, but will hold off until after postop appointment for Mohs surgery.  Reviewed course of treatment and expected reaction.  Patient advised to expect inflammation and crusting and advised that erosions are possible.  Patient advised to be diligent with sun protection during and after treatment. Handout with details of how to apply medication and what to expect provided. Counseled to keep medication out of reach of children and pets.  Reviewed course of treatment and expected reaction.  Patient advised to expect inflammation and crusting and advised that erosions are possible.  Patient advised to be diligent with sun protection during and after treatment. Handout with details of how to apply medication and what to expect provided. Counseled to keep medication out of reach of children and pets.  Return in about 2 weeks (around 06/03/2023) for wound check.  Owens Shark, CMA, am acting as scribe for Gwenith Daily, MD.    05/20/2023  HISTORY OF PRESENT ILLNESS  Amiel Mccaffrey is seen in consultation at the request of Dr. 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 They note that the area has been present for about 6 months increasing in size with time.  There is no history of previous treatment.  Reports no other new or changing lesions and has no other complaints today.  Medications and allergies: see patient chart.  Review of systems: Reviewed 8 systems and notable for the above skin cancer.  All other systems reviewed are unremarkable/negative, unless noted in the HPI. Past medical history, surgical history, family history, social history were also reviewed and are noted in the chart/questionnaire.    PHYSICAL  EXAMINATION  General: Well-appearing, in no acute distress, alert and oriented x 4. Vitals reviewed in chart (if available).   Skin: Exam reveals a 1.2 x 0.5 cm erythematous papule and biopsy scar on the right forehead. There are rhytids, telangiectasias, and lentigines, consistent with photodamage.  Biopsy report(s) reviewed, confirming the diagnosis.   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 Due to location, size, histology, or recurrence and the likelihood of subclinical extension as well as the need to conserve normal surrounding tissue, the patient was deemed acceptable for Mohs micrographic surgery (MMS).  The nature and purpose of the procedure, associated benefits and risks including recurrence and scarring, possible complications such as pain, infection, and bleeding, and alternative methods of treatment if appropriate were discussed with the patient during consent. The lesion location was verified by the patient, by reviewing previous notes, pathology reports, and by photographs as well as angulation measurements if available.  Informed consent was reviewed and signed by the patient, and timeout was performed at 9:30 AM. See op note below.  2. For the photodamage and solar lentigines, sun protection discussed/information given on OTC sunscreens, and we recommend continued regular follow-up with primary dermatologist every 6 months or sooner for any growing, bleeding, or changing lesions. 3. Prognosis and future surveillance discussed. 4. Letter with treatment outcome sent to referring provider. 5. Pain acetaminophen/ibuprofen  MOHS MICROGRAPHIC SURGERY AND RECONSTRUCTION  Initial size:   1.2 x 0.5 cm Surgical defect/wound size: 1.8 x 1.2 cm Anesthesia:    0.33% lidocaine with 1:200,000 epinephrine EBL:    <  5 mL Complications:  None Repair type:   Complex SQ suture:   5-0 Monocryl Cutaneous  suture:  6-0 Plain gut Final size of the repair: 4.2 cm  Stages: 2  STAGE I: Anesthesia achieved with 0.5% lidocaine with 1:200,000 epinephrine. ChloraPrep applied. 1 section(s) excised using Mohs technique (this includes total peripheral and deep tissue margin excision and evaluation with frozen sections, excised and interpreted by the same physician). The tumor was first debulked and then excised with an approx. 2 mm margin.  Hemostasis was achieved with electrocautery as needed.  The specimen was then oriented, subdivided/relaxed, inked, and processed using Mohs technique.    Frozen section analysis revealed a positive margin for full thickness epidermal architectural and cellular atypia, apoptotic cells, individual cell dyskeratosis with markedly altered maturation but usually still some surface keratinization and intercellular bridges present with marked nuclear atypia, including nuclear hyperchromasia and multinucleation in the peripheral margin.    STAGE II: An additional 2 mm margin was excised.  Hemostasis was achieved with electrocautery as needed.  The specimen was then oriented, subdivided/relaxed, inked, and processed using Mohs technique. Evaluation of slides by the Mohs surgeon revealed clear tumor margins.  Reconstruction  The surgical wound was then cleaned, prepped, and re-anesthetized as above. Wound edges were undermined extensively along at least one entire edge and at a distance equal to or greater than the width of the defect (see wound defect size above) in order to achieve closure and decrease wound tension and anatomic distortion. Redundant tissue repair including standing cone removal was performed. Hemostasis was achieved with electrocautery. Subcutaneous and epidermal tissues were approximated with the above sutures. The surgical site was then lightly scrubbed with sterile, saline-soaked gauze. The area was then bandaged using Vaseline ointment, non-adherent gauze, gauze  pads, and tape to provide an adequate pressure dressing. The patient tolerated the procedure well, was given detailed written and verbal wound care instructions, and was discharged in good condition.   The patient will follow-up: 2 weeks.    Documentation: I have reviewed the above documentation for accuracy and completeness, and I agree with the above.  Gwenith Daily, MD

## 2023-05-21 ENCOUNTER — Encounter: Payer: Self-pay | Admitting: Dermatology

## 2023-06-07 ENCOUNTER — Other Ambulatory Visit: Payer: Self-pay | Admitting: Cardiology

## 2023-06-07 DIAGNOSIS — E119 Type 2 diabetes mellitus without complications: Secondary | ICD-10-CM

## 2023-06-07 DIAGNOSIS — I25118 Atherosclerotic heart disease of native coronary artery with other forms of angina pectoris: Secondary | ICD-10-CM

## 2023-06-08 ENCOUNTER — Encounter: Payer: Self-pay | Admitting: Dermatology

## 2023-06-08 ENCOUNTER — Ambulatory Visit (INDEPENDENT_AMBULATORY_CARE_PROVIDER_SITE_OTHER): Payer: Medicare HMO | Admitting: Dermatology

## 2023-06-08 VITALS — BP 121/77 | HR 65

## 2023-06-08 DIAGNOSIS — L570 Actinic keratosis: Secondary | ICD-10-CM

## 2023-06-08 DIAGNOSIS — Z5111 Encounter for antineoplastic chemotherapy: Secondary | ICD-10-CM | POA: Diagnosis not present

## 2023-06-08 DIAGNOSIS — W908XXA Exposure to other nonionizing radiation, initial encounter: Secondary | ICD-10-CM | POA: Diagnosis not present

## 2023-06-08 DIAGNOSIS — L905 Scar conditions and fibrosis of skin: Secondary | ICD-10-CM

## 2023-06-08 DIAGNOSIS — L539 Erythematous condition, unspecified: Secondary | ICD-10-CM | POA: Diagnosis not present

## 2023-06-08 DIAGNOSIS — Z86007 Personal history of in-situ neoplasm of skin: Secondary | ICD-10-CM | POA: Diagnosis not present

## 2023-06-08 DIAGNOSIS — T1490XD Injury, unspecified, subsequent encounter: Secondary | ICD-10-CM

## 2023-06-08 DIAGNOSIS — C4492 Squamous cell carcinoma of skin, unspecified: Secondary | ICD-10-CM | POA: Insufficient documentation

## 2023-06-08 NOTE — Progress Notes (Signed)
 Follow Up Visit   Subjective  Daniel Quinn is a 78 y.o. male who presents for the following: follow up from Mohs surgery   The patient presents for follow up from Mohs surgery for a CIS on the right forehead, treated on 05/20/23, repaired with linear closure. The patient has been bandaging the wound as directed. The endorse the following concerns: none  The following portions of the chart were reviewed this encounter and updated as appropriate: medications, allergies, medical history  Review of Systems:  No other skin or systemic complaints except as noted in HPI or Assessment and Plan.  Objective  Well appearing patient in no apparent distress; mood and affect are within normal limits.  A full examination was performed including scalp, head, face. All findings within normal limits unless otherwise noted below.  Healing wound with mild erythema  Relevant physical exam findings are noted in the Assessment and Plan.    Assessment & Plan    Healing s/p Mohs for CIS, treated on 05/20/23, repaired with linear closure - Reassured that wound is healing well - No evidence of infection - No swelling, induration, purulence, dehiscence, or tenderness out of proportion to the clinical exam, see photo above - Discussed that scars take up to 12 months to mature from the date of surgery - Recommend SPF 30+ to scar daily to prevent purple color from UV exposure during scar maturation process - Discussed that erythema and raised appearance of scar will fade over the next 4-6 months - OK to start scar massage at 4-6 weeks post-op - Can consider silicone based products for scar healing starting at 6 weeks post-op - Ok to continue ointment daily to wound under a bandage for another week  ACTINIC KERATOSIS- flared not at treatment goal Exam: Erythematous thin papules/macules with gritty scale at the face and hands  Actinic keratoses are precancerous spots that appear secondary to cumulative UV  radiation exposure/sun exposure over time. They are chronic with expected duration over 1 year. A portion of actinic keratoses will progress to squamous cell carcinoma of the skin. It is not possible to reliably predict which spots will progress to skin cancer and so treatment is recommended to prevent development of skin cancer.  Recommend daily broad spectrum sunscreen SPF 30+ to sun-exposed areas, reapply every 2 hours as needed.  Recommend staying in the shade or wearing long sleeves, sun glasses (UVA+UVB protection) and wide brim hats (4-inch brim around the entire circumference of the hat). Call for new or changing lesions.  Treatment Plan: Start 5-fluorouracil cream twice a day for 14 days to affected areas including face and neck.  Reviewed course of treatment and expected reaction.  Patient advised to expect inflammation and crusting and advised that erosions are possible.  Patient advised to be diligent with sun protection during and after treatment. Handout with details of how to apply medication and what to expect provided. Counseled to keep medication out of reach of children and pets.  Reviewed course of treatment and expected reaction.  Patient advised to expect inflammation and crusting and advised that erosions are possible.  Patient advised to be diligent with sun protection during and after treatment. Handout with details of how to apply medication and what to expect provided. Counseled to keep medication out of reach of children and pets.  HISTORY OF SQUAMOUS CELL CARCINOMA OF THE SKIN - No evidence of recurrence today - No lymphadenopathy - Recommend regular full body skin exams - Recommend daily broad spectrum  sunscreen SPF 30+ to sun-exposed areas, reapply every 2 hours as needed.  - Call if any new or changing lesions are noted between office visits  SCC (SQUAMOUS CELL CARCINOMA)    Return in about 2 months (around 08/08/2023) for AK f/u.  I, Tillie Fantasia, CMA, am  acting as scribe for Gwenith Daily, MD.   Documentation: I have reviewed the above documentation for accuracy and completeness, and I agree with the above.  Gwenith Daily, MD

## 2023-06-08 NOTE — Patient Instructions (Addendum)

## 2023-06-08 NOTE — Progress Notes (Deleted)
 Marland Kitchen

## 2023-06-13 ENCOUNTER — Other Ambulatory Visit: Payer: Self-pay | Admitting: Cardiology

## 2023-08-10 ENCOUNTER — Ambulatory Visit: Admitting: Dermatology

## 2023-08-10 ENCOUNTER — Encounter: Payer: Self-pay | Admitting: Dermatology

## 2023-08-10 VITALS — BP 108/68 | HR 69

## 2023-08-10 DIAGNOSIS — W908XXA Exposure to other nonionizing radiation, initial encounter: Secondary | ICD-10-CM | POA: Diagnosis not present

## 2023-08-10 DIAGNOSIS — L905 Scar conditions and fibrosis of skin: Secondary | ICD-10-CM | POA: Diagnosis not present

## 2023-08-10 DIAGNOSIS — Z5111 Encounter for antineoplastic chemotherapy: Secondary | ICD-10-CM | POA: Diagnosis not present

## 2023-08-10 DIAGNOSIS — L570 Actinic keratosis: Secondary | ICD-10-CM

## 2023-08-10 DIAGNOSIS — Z86007 Personal history of in-situ neoplasm of skin: Secondary | ICD-10-CM

## 2023-08-10 DIAGNOSIS — C4492 Squamous cell carcinoma of skin, unspecified: Secondary | ICD-10-CM

## 2023-08-10 NOTE — Patient Instructions (Signed)

## 2023-08-10 NOTE — Progress Notes (Signed)
 Follow-Up Visit   Subjective  Daniel Quinn is a 78 y.o. male who presents for the following: Follow up after treatment for Aks  The following portions of the chart were reviewed this encounter and updated as appropriate: medications, allergies, medical history  Review of Systems:  No other skin or systemic complaints except as noted in HPI or Assessment and Plan.  Objective  Well appearing patient in no apparent distress; mood and affect are within normal limits.   A focused examination was performed of the following areas: Face and neck  Relevant exam findings are noted in the Assessment and Plan.  Dorsum of Nose, Left Forearm - Posterior (3), Right Forearm - Posterior (2), Right Hand - Posterior Erythematous thin papules/macules with gritty scale.   Assessment & Plan   ACTINIC KERATOSIS-  at treatment goal for face. S/p topical 5FU  ACTINIC KERATOSIS- flared not at treatment goal for bilateral forearms; discussed that he can use topical 5FU to arms and hands  Exam: Erythematous thin papules/macules with gritty scale at the face and hands   Actinic keratoses are precancerous spots that appear secondary to cumulative UV radiation exposure/sun exposure over time. They are chronic with expected duration over 1 year. A portion of actinic keratoses will progress to squamous cell carcinoma of the skin. It is not possible to reliably predict which spots will progress to skin cancer and so treatment is recommended to prevent development of skin cancer.   Recommend daily broad spectrum sunscreen SPF 30+ to sun-exposed areas, reapply every 2 hours as needed.  Recommend staying in the shade or wearing long sleeves, sun glasses (UVA+UVB protection) and wide brim hats (4-inch brim around the entire circumference of the hat). Call for new or changing lesions.  Scar s/p Mohs for CIS, treated on 05/20/23, repaired with linear closure - Reassured that wound is healing well - No evidence of  infection  - Discussed that scars take up to 12 months to mature from the date of surgery - Recommend SPF 30+ to scar daily to prevent purple color from UV exposure during scar maturation process - Discussed that erythema and raised appearance of scar will fade over the next 4-6 months - OK to start scar massage at 4-6 weeks post-op - Can consider silicone based products for scar healing starting at 6 weeks post-op  HISTORY OF SQUAMOUS CELL CARCINOMA IN SITU OF THE SKIN - No evidence of recurrence today - Recommend regular full body skin exams - Recommend daily broad spectrum sunscreen SPF 30+ to sun-exposed areas, reapply every 2 hours as needed.  - Call if any new or changing lesions are noted between office visits  AK (ACTINIC KERATOSIS) (7) Dorsum of Nose, Left Forearm - Posterior (3), Right Forearm - Posterior (2), Right Hand - Posterior Destruction of lesion - Dorsum of Nose, Left Forearm - Posterior (3), Right Forearm - Posterior (2), Right Hand - Posterior Complexity: simple   Destruction method: cryotherapy   Informed consent: discussed and consent obtained   Timeout:  patient name, date of birth, surgical site, and procedure verified Lesion destroyed using liquid nitrogen: Yes   Region frozen until ice ball extended beyond lesion: Yes   Cryotherapy cycles:  2 Outcome: patient tolerated procedure well with no complications   Post-procedure details: wound care instructions given   SCAR   SQUAMOUS CELL CARCINOMA OF SKIN    Treatment Plan: Start 5-fluorouracil  cream twice a day for 14 days to affected areas including bilateral forearms.  Reviewed course  of treatment and expected reaction.  Patient advised to expect inflammation and crusting and advised that erosions are possible.  Patient advised to be diligent with sun protection during and after treatment. Handout with details of how to apply medication and what to expect provided. Counseled to keep medication out of reach of  children and pets.   Reviewed course of treatment and expected reaction.  Patient advised to expect inflammation and crusting and advised that erosions are possible.  Patient advised to be diligent with sun protection during and after treatment. Handout with details of how to apply medication and what to expect provided. Counseled to keep medication out of reach of children and pets.  Return in about 6 months (around 02/10/2024) for UBSC.  I, Haig Levan, Surg Tech III, am acting as scribe for Deneise Finlay, MD.   Documentation: I have reviewed the above documentation for accuracy and completeness, and I agree with the above.  Deneise Finlay, MD

## 2023-08-12 ENCOUNTER — Other Ambulatory Visit: Payer: Self-pay | Admitting: Cardiology

## 2023-08-12 DIAGNOSIS — E7800 Pure hypercholesterolemia, unspecified: Secondary | ICD-10-CM

## 2023-08-20 ENCOUNTER — Encounter: Payer: Self-pay | Admitting: Dermatology

## 2023-11-06 ENCOUNTER — Other Ambulatory Visit: Payer: Self-pay | Admitting: Cardiology

## 2024-02-03 ENCOUNTER — Other Ambulatory Visit: Payer: Self-pay | Admitting: Cardiology

## 2024-02-03 DIAGNOSIS — E7800 Pure hypercholesterolemia, unspecified: Secondary | ICD-10-CM

## 2024-02-10 ENCOUNTER — Ambulatory Visit: Admitting: Dermatology

## 2024-02-10 ENCOUNTER — Encounter: Payer: Self-pay | Admitting: Dermatology

## 2024-02-10 VITALS — BP 121/71 | HR 59 | Temp 98.0°F

## 2024-02-10 DIAGNOSIS — L570 Actinic keratosis: Secondary | ICD-10-CM

## 2024-02-10 DIAGNOSIS — L578 Other skin changes due to chronic exposure to nonionizing radiation: Secondary | ICD-10-CM | POA: Diagnosis not present

## 2024-02-10 DIAGNOSIS — D229 Melanocytic nevi, unspecified: Secondary | ICD-10-CM

## 2024-02-10 DIAGNOSIS — L814 Other melanin hyperpigmentation: Secondary | ICD-10-CM

## 2024-02-10 DIAGNOSIS — Z1283 Encounter for screening for malignant neoplasm of skin: Secondary | ICD-10-CM | POA: Diagnosis not present

## 2024-02-10 DIAGNOSIS — D489 Neoplasm of uncertain behavior, unspecified: Secondary | ICD-10-CM | POA: Diagnosis not present

## 2024-02-10 DIAGNOSIS — L821 Other seborrheic keratosis: Secondary | ICD-10-CM

## 2024-02-10 DIAGNOSIS — W908XXA Exposure to other nonionizing radiation, initial encounter: Secondary | ICD-10-CM | POA: Diagnosis not present

## 2024-02-10 DIAGNOSIS — D044 Carcinoma in situ of skin of scalp and neck: Secondary | ICD-10-CM | POA: Diagnosis not present

## 2024-02-10 DIAGNOSIS — D1801 Hemangioma of skin and subcutaneous tissue: Secondary | ICD-10-CM

## 2024-02-10 NOTE — Patient Instructions (Addendum)

## 2024-02-10 NOTE — Progress Notes (Signed)
 Total Body Skin Exam (TBSE) Visit  History of Present Illness Daniel Quinn is a 78 year old male who presents with concerns about skin lesions.  He has a skin lesion on his chest that has been present for one to two months. He is unsure about the nature of the lesion and seeks evaluation. He mentions using a cream on his arms, but only a little bit.  He spends a considerable amount of time outdoors, which may contribute to the development of skin lesions.  A dark spot on his scalp is difficult for him to see. This spot is described as a three millimeter regularly shaved brown papule, and a biopsy is planned to further evaluate it.  His mother is of [place] descent, which may be relevant to his skin type and susceptibility to sun damage.  The following portions of the chart were reviewed this encounter and updated as appropriate: medications, allergies, medical history  Review of Systems:  No other skin or systemic complaints except as noted in HPI or Assessment and Plan.  Objective  Well appearing patient in no apparent distress; mood and affect are within normal limits.  A full examination was performed including scalp, head, eyes, ears, nose, lips, neck, chest, axillae, abdomen, back, buttocks, bilateral upper extremities, bilateral lower extremities, hands, feet, fingers, toes, fingernails, and toenails. All findings within normal limits unless otherwise noted below.   Relevant physical exam findings are noted in the Assessment and Plan.  Scalp 0.3 irregular shaped brown papule   Assessment & Plan   LENTIGINES, SEBORRHEIC KERATOSES, HEMANGIOMAS - Benign normal skin lesions - Benign-appearing - Call for any changes  MELANOCYTIC NEVI - Tan-brown and/or pink-flesh-colored symmetric macules and papules - Benign appearing on exam today - Observation - Call clinic for new or changing moles - Recommend daily use of broad spectrum spf 30+ sunscreen to sun-exposed areas.    ACTINIC DAMAGE - Chronic condition, secondary to cumulative UV/sun exposure - diffuse scaly erythematous macules with underlying dyspigmentation - Recommend daily broad spectrum sunscreen SPF 30+ to sun-exposed areas, reapply every 2 hours as needed.  - Staying in the shade or wearing long sleeves, sun glasses (UVA+UVB protection) and wide brim hats (4-inch brim around the entire circumference of the hat) are also recommended for sun protection.  - Call for new or changing lesions.  SKIN CANCER SCREENING PERFORMED TODAY.  NEOPLASM OF UNCERTAIN BEHAVIOR Scalp Skin / nail biopsy Type of biopsy: tangential   Informed consent: discussed and consent obtained   Timeout: patient name, date of birth, surgical site, and procedure verified   Procedure prep:  Patient was prepped and draped in usual sterile fashion Prep type:  Isopropyl alcohol Anesthesia: the lesion was anesthetized in a standard fashion   Anesthetic:  1% lidocaine  w/ epinephrine 1-100,000 buffered w/ 8.4% NaHCO3 Instrument used: flexible razor blade   Hemostasis achieved with: pressure, aluminum chloride and electrodesiccation   Outcome: patient tolerated procedure well   Post-procedure details: sterile dressing applied and wound care instructions given   Dressing type: bandage and petrolatum    Specimen 1 - Surgical pathology Differential Diagnosis: R/O [facility]  Check Margins: No AK (ACTINIC KERATOSIS) (9) Chest - Medial (Center), Left Forearm - Posterior (2), Left Hand - Posterior, Left Upper Back, Right Forearm - Anterior (3), Right Hand - Anterior Destruction of lesion - Chest - Medial (Center), Left Forearm - Posterior (2), Left Hand - Posterior, Left Upper Back, Right Forearm - Anterior (3), Right Hand - Anterior Complexity: simple  Destruction method: cryotherapy   Informed consent: discussed and consent obtained   Timeout:  patient name, date of birth, surgical site, and procedure verified Lesion destroyed using  liquid nitrogen: Yes   Region frozen until ice ball extended beyond lesion: Yes   Outcome: patient tolerated procedure well with no complications   Post-procedure details: wound care instructions given    Return in about 6 months (around 08/09/2024) for UBSC.  I, Doyce Pan, CMA, am acting as scribe for RUFUS CHRISTELLA HOLY, MD.   Documentation: I have reviewed the above documentation for accuracy and completeness, and I agree with the above.  RUFUS CHRISTELLA HOLY, MD

## 2024-02-11 LAB — SURGICAL PATHOLOGY

## 2024-02-14 ENCOUNTER — Ambulatory Visit: Payer: Self-pay | Admitting: Dermatology

## 2024-02-15 NOTE — Progress Notes (Signed)
 Spoke with pt gave him bx results and treatment recommendations

## 2024-04-05 ENCOUNTER — Other Ambulatory Visit: Payer: Self-pay | Admitting: Cardiology

## 2024-04-05 DIAGNOSIS — E119 Type 2 diabetes mellitus without complications: Secondary | ICD-10-CM

## 2024-04-05 DIAGNOSIS — I25118 Atherosclerotic heart disease of native coronary artery with other forms of angina pectoris: Secondary | ICD-10-CM

## 2024-04-13 ENCOUNTER — Other Ambulatory Visit (HOSPITAL_BASED_OUTPATIENT_CLINIC_OR_DEPARTMENT_OTHER): Payer: Self-pay | Admitting: Family Medicine

## 2024-04-13 DIAGNOSIS — M8580 Other specified disorders of bone density and structure, unspecified site: Secondary | ICD-10-CM

## 2024-04-19 ENCOUNTER — Ambulatory Visit: Admitting: Dermatology

## 2024-04-19 ENCOUNTER — Encounter: Payer: Self-pay | Admitting: Dermatology

## 2024-04-19 DIAGNOSIS — D099 Carcinoma in situ, unspecified: Secondary | ICD-10-CM

## 2024-04-19 DIAGNOSIS — Z08 Encounter for follow-up examination after completed treatment for malignant neoplasm: Secondary | ICD-10-CM | POA: Diagnosis not present

## 2024-04-19 DIAGNOSIS — W908XXA Exposure to other nonionizing radiation, initial encounter: Secondary | ICD-10-CM | POA: Diagnosis not present

## 2024-04-19 DIAGNOSIS — D044 Carcinoma in situ of skin of scalp and neck: Secondary | ICD-10-CM | POA: Diagnosis not present

## 2024-04-19 DIAGNOSIS — L570 Actinic keratosis: Secondary | ICD-10-CM | POA: Diagnosis not present

## 2024-04-19 NOTE — Progress Notes (Signed)
 "  Follow-Up Visit  History of Present Illness Daniel Quinn is a 79 year old male who presents with treatment of skin cancer and actinic keratoses.  He reports that a spot on his scalp was biopsied and identified as a 'little skin cancer' within a precancerous area. The lesion is described as a 'little skin cancer' within a precancerous area.  No new lesions have appeared since his last visit, but there is a crusted area on his scalp that he noticed.  He has a history of multiple actinic keratoses, including on the scalp, left ear, left temple, right cheek, right temporal area, and right ear.  He reports using sunscreen with SPF 30 or more.   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   Scar on right temple from previous Mohs surgery healing well.   The following portions of the chart were reviewed this encounter and updated as appropriate: medications, allergies, medical history  Review of Systems:  No other skin or systemic complaints except as noted in HPI or Assessment and Plan.  Objective  Well appearing patient in no apparent distress; mood and affect are within normal limits.  A focused examination was performed of the following areas: Scalp Face  Relevant exam findings are noted in the Assessment and Plan.  Scalp Hyperkeratotic plaque scalp x 3, left temple, left ear, right ear, right temple x 3 (9) Erythematous thin papules/macules with gritty scale.   Assessment & Plan   ACTINIC KERATOSIS Exam: Erythematous thin papules/macules with gritty scale  Actinic keratoses are precancerous spots that appear secondary to cumulative UV radiation exposure/sun exposure over time. They are chronic with expected duration over 1 year. A portion of actinic keratoses will progress to squamous cell carcinoma of the skin. It is not possible to reliably predict which spots will progress to skin cancer and so treatment is recommended to prevent development of skin  cancer.  Recommend daily broad spectrum sunscreen SPF 30+ to sun-exposed areas, reapply every 2 hours as needed.  Recommend staying in the shade or wearing long sleeves, sun glasses (UVA+UVB protection) and wide brim hats (4-inch brim around the entire circumference of the hat). Call for new or changing lesions.  Treatment Plan:  Prior to procedure, discussed risks of blister formation, small wound, skin dyspigmentation, or rare scar following cryotherapy. Recommend Vaseline ointment to treated areas while healing.  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 Complexity: extensive   Destruction method: cryotherapy   Informed consent: discussed and consent obtained   Lesion length (cm):  1.1 Margin per side (cm):  0.1 Outcome: patient tolerated procedure well with no complications   Post-procedure details: sterile dressing applied and wound care instructions given   Dressing type: bandage and pressure dressing    AK (ACTINIC KERATOSIS) (9) scalp x 3, left temple, left ear, right ear, right temple x 3 (9) - Destruction of lesion - scalp x 3, left temple, left ear, right ear, right temple x 3 (9) Complexity: simple   Destruction method: cryotherapy   Informed consent: discussed and consent obtained   Timeout:  patient name, date of birth, surgical site, and procedure verified Outcome: patient tolerated procedure well with no complications   Post-procedure details: wound  care instructions given     Return in about 3 months (around 07/18/2024) for TBSE.  I, Daniel Quinn, CMA, am acting as scribe for Daniel CHRISTELLA HOLY, MD.   Documentation: I have reviewed the above documentation for accuracy  and completeness, and I agree with the above.  Daniel CHRISTELLA HOLY, MD    "

## 2024-04-19 NOTE — Patient Instructions (Signed)

## 2024-04-20 ENCOUNTER — Other Ambulatory Visit: Payer: Self-pay | Admitting: Cardiology

## 2024-08-09 ENCOUNTER — Ambulatory Visit: Admitting: Dermatology
